# Patient Record
Sex: Female | Born: 1945
Health system: Southern US, Community
[De-identification: ages and names within clinical notes are randomized; demographics above are authoritative.]

## PROBLEM LIST (undated history)

## (undated) DIAGNOSIS — R112 Nausea with vomiting, unspecified: Secondary | ICD-10-CM

## (undated) DIAGNOSIS — E079 Disorder of thyroid, unspecified: Secondary | ICD-10-CM

## (undated) DIAGNOSIS — K760 Fatty (change of) liver, not elsewhere classified: Secondary | ICD-10-CM

## (undated) DIAGNOSIS — M8580 Other specified disorders of bone density and structure, unspecified site: Secondary | ICD-10-CM

## (undated) DIAGNOSIS — G5600 Carpal tunnel syndrome, unspecified upper limb: Secondary | ICD-10-CM

## (undated) DIAGNOSIS — E669 Obesity, unspecified: Secondary | ICD-10-CM

## (undated) DIAGNOSIS — M169 Osteoarthritis of hip, unspecified: Secondary | ICD-10-CM

## (undated) DIAGNOSIS — K219 Gastro-esophageal reflux disease without esophagitis: Secondary | ICD-10-CM

## (undated) DIAGNOSIS — H269 Unspecified cataract: Secondary | ICD-10-CM

## (undated) DIAGNOSIS — R748 Abnormal levels of other serum enzymes: Secondary | ICD-10-CM

## (undated) DIAGNOSIS — E785 Hyperlipidemia, unspecified: Secondary | ICD-10-CM

## (undated) DIAGNOSIS — K5790 Diverticulosis of intestine, part unspecified, without perforation or abscess without bleeding: Secondary | ICD-10-CM

## (undated) DIAGNOSIS — Z9889 Other specified postprocedural states: Secondary | ICD-10-CM

## (undated) DIAGNOSIS — M1990 Unspecified osteoarthritis, unspecified site: Secondary | ICD-10-CM

## (undated) DIAGNOSIS — K648 Other hemorrhoids: Secondary | ICD-10-CM

## (undated) DIAGNOSIS — R7303 Prediabetes: Secondary | ICD-10-CM

## (undated) DIAGNOSIS — J45909 Unspecified asthma, uncomplicated: Secondary | ICD-10-CM

## (undated) DIAGNOSIS — I10 Essential (primary) hypertension: Secondary | ICD-10-CM

## (undated) DIAGNOSIS — D649 Anemia, unspecified: Secondary | ICD-10-CM

## (undated) DIAGNOSIS — E559 Vitamin D deficiency, unspecified: Secondary | ICD-10-CM

## (undated) HISTORY — DX: Prediabetes: R73.03

## (undated) HISTORY — DX: Anemia, unspecified: D64.9

## (undated) HISTORY — DX: Gastro-esophageal reflux disease without esophagitis: K21.9

## (undated) HISTORY — PX: BREAST BIOPSY: SHX20

## (undated) HISTORY — DX: Unspecified asthma, uncomplicated: J45.909

## (undated) HISTORY — DX: Nausea with vomiting, unspecified: R11.2

## (undated) HISTORY — DX: Osteoarthritis of hip, unspecified: M16.9

## (undated) HISTORY — DX: Other specified postprocedural states: Z98.890

## (undated) HISTORY — DX: Hyperlipidemia, unspecified: E78.5

## (undated) HISTORY — PX: CARPAL TUNNEL RELEASE: SHX101

## (undated) HISTORY — PX: ROTATOR CUFF REPAIR: SHX139

## (undated) HISTORY — DX: Essential (primary) hypertension: I10

## (undated) HISTORY — DX: Other specified disorders of bone density and structure, unspecified site: M85.80

## (undated) HISTORY — PX: DENTAL SURGERY: SHX609

## (undated) HISTORY — DX: Other hemorrhoids: K64.8

## (undated) HISTORY — DX: Unspecified cataract: H26.9

## (undated) HISTORY — PX: TOE FUSION: SHX1070

## (undated) HISTORY — DX: Disorder of thyroid, unspecified: E07.9

## (undated) HISTORY — PX: CATARACT EXTRACTION: SUR2

## (undated) HISTORY — DX: Carpal tunnel syndrome, unspecified upper limb: G56.00

## (undated) HISTORY — DX: Abnormal levels of other serum enzymes: R74.8

## (undated) HISTORY — DX: Unspecified osteoarthritis, unspecified site: M19.90

## (undated) HISTORY — DX: Vitamin D deficiency, unspecified: E55.9

## (undated) HISTORY — DX: Obesity, unspecified: E66.9

## (undated) HISTORY — DX: Fatty (change of) liver, not elsewhere classified: K76.0

## (undated) HISTORY — DX: Diverticulosis of intestine, part unspecified, without perforation or abscess without bleeding: K57.90

## (undated) HISTORY — PX: COLONOSCOPY: SHX174

---

## 1998-09-06 ENCOUNTER — Other Ambulatory Visit: Admission: RE | Admit: 1998-09-06 | Discharge: 1998-09-06 | Payer: Self-pay | Admitting: Family Medicine

## 1999-04-21 ENCOUNTER — Encounter: Admission: RE | Admit: 1999-04-21 | Discharge: 1999-04-21 | Payer: Self-pay | Admitting: Family Medicine

## 1999-04-21 ENCOUNTER — Encounter: Payer: Self-pay | Admitting: Family Medicine

## 2000-01-04 ENCOUNTER — Other Ambulatory Visit: Admission: RE | Admit: 2000-01-04 | Discharge: 2000-01-04 | Payer: Self-pay | Admitting: Family Medicine

## 2000-05-10 ENCOUNTER — Encounter: Admission: RE | Admit: 2000-05-10 | Discharge: 2000-05-10 | Payer: Self-pay | Admitting: Family Medicine

## 2000-05-10 ENCOUNTER — Encounter: Payer: Self-pay | Admitting: Family Medicine

## 2000-06-06 ENCOUNTER — Ambulatory Visit (HOSPITAL_COMMUNITY): Admission: RE | Admit: 2000-06-06 | Discharge: 2000-06-06 | Payer: Self-pay | Admitting: Family Medicine

## 2000-06-06 ENCOUNTER — Encounter: Payer: Self-pay | Admitting: Family Medicine

## 2001-01-28 ENCOUNTER — Other Ambulatory Visit: Admission: RE | Admit: 2001-01-28 | Discharge: 2001-01-28 | Payer: Self-pay | Admitting: Family Medicine

## 2001-06-20 ENCOUNTER — Encounter: Admission: RE | Admit: 2001-06-20 | Discharge: 2001-06-20 | Payer: Self-pay | Admitting: Family Medicine

## 2001-06-20 ENCOUNTER — Encounter: Payer: Self-pay | Admitting: Family Medicine

## 2001-11-07 ENCOUNTER — Encounter: Payer: Self-pay | Admitting: Orthopaedic Surgery

## 2001-11-07 ENCOUNTER — Ambulatory Visit (HOSPITAL_COMMUNITY): Admission: RE | Admit: 2001-11-07 | Discharge: 2001-11-07 | Payer: Self-pay | Admitting: Orthopaedic Surgery

## 2002-02-19 ENCOUNTER — Other Ambulatory Visit: Admission: RE | Admit: 2002-02-19 | Discharge: 2002-02-19 | Payer: Self-pay | Admitting: Family Medicine

## 2002-02-27 ENCOUNTER — Ambulatory Visit (HOSPITAL_COMMUNITY): Admission: RE | Admit: 2002-02-27 | Discharge: 2002-02-27 | Payer: Self-pay | Admitting: Orthopaedic Surgery

## 2002-02-27 ENCOUNTER — Encounter: Payer: Self-pay | Admitting: Orthopaedic Surgery

## 2002-04-02 HISTORY — PX: JOINT REPLACEMENT: SHX530

## 2002-04-28 ENCOUNTER — Encounter: Payer: Self-pay | Admitting: Orthopaedic Surgery

## 2002-04-30 ENCOUNTER — Inpatient Hospital Stay (HOSPITAL_COMMUNITY): Admission: RE | Admit: 2002-04-30 | Discharge: 2002-05-05 | Payer: Self-pay | Admitting: Orthopaedic Surgery

## 2002-04-30 ENCOUNTER — Encounter: Payer: Self-pay | Admitting: Orthopaedic Surgery

## 2002-06-26 ENCOUNTER — Encounter: Admission: RE | Admit: 2002-06-26 | Discharge: 2002-06-26 | Payer: Self-pay | Admitting: Family Medicine

## 2002-06-26 ENCOUNTER — Encounter: Payer: Self-pay | Admitting: Family Medicine

## 2003-04-22 ENCOUNTER — Other Ambulatory Visit: Admission: RE | Admit: 2003-04-22 | Discharge: 2003-04-22 | Payer: Self-pay | Admitting: Family Medicine

## 2003-07-09 ENCOUNTER — Encounter: Admission: RE | Admit: 2003-07-09 | Discharge: 2003-07-09 | Payer: Self-pay | Admitting: Family Medicine

## 2004-06-15 ENCOUNTER — Other Ambulatory Visit: Admission: RE | Admit: 2004-06-15 | Discharge: 2004-06-15 | Payer: Self-pay | Admitting: Family Medicine

## 2004-07-21 ENCOUNTER — Encounter: Admission: RE | Admit: 2004-07-21 | Discharge: 2004-07-21 | Payer: Self-pay | Admitting: Family Medicine

## 2005-06-28 ENCOUNTER — Other Ambulatory Visit: Admission: RE | Admit: 2005-06-28 | Discharge: 2005-06-28 | Payer: Self-pay | Admitting: Family Medicine

## 2005-06-29 ENCOUNTER — Encounter: Admission: RE | Admit: 2005-06-29 | Discharge: 2005-06-29 | Payer: Self-pay | Admitting: Family Medicine

## 2005-07-27 ENCOUNTER — Encounter: Admission: RE | Admit: 2005-07-27 | Discharge: 2005-07-27 | Payer: Self-pay | Admitting: Family Medicine

## 2006-08-02 ENCOUNTER — Encounter: Admission: RE | Admit: 2006-08-02 | Discharge: 2006-08-02 | Payer: Self-pay | Admitting: Family Medicine

## 2006-08-15 ENCOUNTER — Other Ambulatory Visit: Admission: RE | Admit: 2006-08-15 | Discharge: 2006-08-15 | Payer: Self-pay | Admitting: Family Medicine

## 2006-09-04 ENCOUNTER — Encounter: Admission: RE | Admit: 2006-09-04 | Discharge: 2006-09-04 | Payer: Self-pay | Admitting: Family Medicine

## 2006-09-19 ENCOUNTER — Encounter: Admission: RE | Admit: 2006-09-19 | Discharge: 2006-09-19 | Payer: Self-pay | Admitting: Family Medicine

## 2007-07-06 ENCOUNTER — Encounter: Admission: RE | Admit: 2007-07-06 | Discharge: 2007-07-06 | Payer: Self-pay | Admitting: Orthopaedic Surgery

## 2007-08-15 ENCOUNTER — Encounter: Admission: RE | Admit: 2007-08-15 | Discharge: 2007-08-15 | Payer: Self-pay | Admitting: Family Medicine

## 2007-08-28 ENCOUNTER — Other Ambulatory Visit: Admission: RE | Admit: 2007-08-28 | Discharge: 2007-08-28 | Payer: Self-pay | Admitting: Family Medicine

## 2008-06-03 ENCOUNTER — Encounter (INDEPENDENT_AMBULATORY_CARE_PROVIDER_SITE_OTHER): Payer: Self-pay | Admitting: *Deleted

## 2008-08-20 ENCOUNTER — Encounter: Admission: RE | Admit: 2008-08-20 | Discharge: 2008-08-20 | Payer: Self-pay | Admitting: Internal Medicine

## 2009-07-20 ENCOUNTER — Telehealth: Payer: Self-pay | Admitting: Internal Medicine

## 2009-09-16 ENCOUNTER — Encounter: Admission: RE | Admit: 2009-09-16 | Discharge: 2009-09-16 | Payer: Self-pay | Admitting: Internal Medicine

## 2009-12-08 ENCOUNTER — Encounter: Payer: Self-pay | Admitting: Internal Medicine

## 2010-01-09 ENCOUNTER — Encounter (INDEPENDENT_AMBULATORY_CARE_PROVIDER_SITE_OTHER): Payer: Self-pay | Admitting: *Deleted

## 2010-01-11 ENCOUNTER — Ambulatory Visit: Payer: Self-pay | Admitting: Internal Medicine

## 2010-01-31 ENCOUNTER — Ambulatory Visit: Payer: Self-pay | Admitting: Internal Medicine

## 2010-05-04 NOTE — Procedures (Signed)
Summary: Colonoscopy  Patient: Allison [name] Note: All result statuses are Final unless otherwise noted.  Tests: (1) Colonoscopy (COL)   COL Colonoscopy           DONE     [facility]     [address]     [place], [place]  [address]           COLONOSCOPY PROCEDURE REPORT           PATIENT:  Allison [name], Allison [name]  MR#:  [id number]     BIRTHDATE:  February 13, 1946, 64 yrs. old  GENDER:  female     ENDOSCOPIST:  Wilhemina Bonito. Eda Keys, MD     REF. BY:  Screening Recall     PROCEDURE DATE:  01/31/2010     PROCEDURE:  Higher-risk screening colonoscopy G0105           ASA CLASS:  Class II     INDICATIONS:  Elevated Risk Screening, family history of colon     cancer ; Mother @55  (negative index exam 06-2003)     MEDICATIONS:   Fentanyl 75 mcg IV, Versed 9 mg IV           DESCRIPTION OF PROCEDURE:   After the risks benefits and     alternatives of the procedure were thoroughly explained, informed     consent was obtained.  Digital rectal exam was performed and     revealed no abnormalities.   The LB 180AL [id number] endoscope was     introduced through the anus and advanced to the cecum, which was     identified by both the appendix and ileocecal valve, without     limitations.Time to cecum = 4:17 min.  The quality of the prep was     excellent, using MoviPrep.  The instrument was then slowly     withdrawn (time = 9:09 min) as the colon was fully examined.     <<PROCEDUREIMAGES>>           FINDINGS:  Severe diverticulosis was found in the left colon with     sigmoid stenosis.  This was otherwise a normal examination of the     colon.  No polyps or cancers were seen.   Retroflexed views in the     rectum revealed internal hemorrhoids.    The scope was then     withdrawn from the patient and the procedure completed.           COMPLICATIONS:  None     ENDOSCOPIC IMPRESSION:     1) Severe diverticulosis in the left colon     2) Otherwise normal examination     3) No polyps or cancers     4) Internal  hemorrhoids           RECOMMENDATIONS:     1) Follow up colonoscopy in 5 years           ______________________________     Wilhemina Bonito. Eda Keys, MD           CC:  Charlesetta Shanks, MD; The Patient           n.     eSIGNED:   Wilhemina Bonito. Eda Keys at 01/31/2010 09:43 AM           Jamal Collin, [id number]  Note: An exclamation mark (!) indicates a result that was not dispersed into the flowsheet. Document Creation Date: 01/31/2010 9:43 AM _______________________________________________________________________  (1) Order result status: Final Collection or  observation date-time: 01/31/2010 09:36 Requested date-time:  Receipt date-time:  Reported date-time:  Referring Physician:   Ordering Physician: Fransico Setters [phone]) Specimen Source:  Source: Launa Grill Order Number: [id number] Lab site:   Appended Document: Colonoscopy    Clinical Lists Changes  Observations: Added new observation of COLONNXTDUE: 02/2015 (01/31/2010 13:18)

## 2010-05-04 NOTE — Miscellaneous (Signed)
Summary: [facility]  Clinical Lists Changes  Medications: Added new medication of MOVIPREP 100 GM  SOLR (PEG-KCL-NACL-NASULF-NA ASC-C) As per prep instructions. - Signed Rx of MOVIPREP 100 GM  SOLR (PEG-KCL-NACL-NASULF-NA ASC-C) As per prep instructions.;  #1 x 0;  Signed;  Entered by: Durwin Glaze RN;  Authorized by: Hilarie Fredrickson MD;  Method used: Electronically to [facility] [address] # [id number]*, [address] [place], [place], [place]  [address], Ph: [phone] or [phone], Fax: [phone] Observations: Added new observation of NKA: T (01/11/2010 16:23)    Prescriptions: MOVIPREP 100 GM  SOLR (PEG-KCL-NACL-NASULF-NA ASC-C) As per prep instructions.  #1 x 0   Entered by:   Durwin Glaze RN   Authorized by:   Hilarie Fredrickson MD   Signed by:   Durwin Glaze RN on 01/11/2010   Method used:   Electronically to        [id number]* (retail)       [address]       [place], [place]  [address]       Ph: [phone] or [phone]       Fax: [phone]   RxID:   [id number]

## 2010-05-04 NOTE — Letter (Signed)
Summary: [facility] Instructions  [facility]  [address] [place], [place] [address]   Phone: [phone]  Fax: [phone]       Allison [name]    May 09, 1945    MRN: [id number]        Procedure Day /Date:  Tuesday 01/31/2010     Arrival Time: 8:00 am      Procedure Time:  9:00 am     Location of Procedure:                    _x _  [facility] (4th Floor)                        PREPARATION FOR COLONOSCOPY WITH MOVIPREP   Starting 5 days prior to your procedure Thursday 10/27 do not eat nuts, seeds, popcorn, corn, beans, peas,  salads, or any raw vegetables.  Do not take any fiber supplements (e.g. Metamucil, Citrucel, and Benefiber).  THE DAY BEFORE YOUR PROCEDURE         DATE: Monday 10/31  1.  Drink clear liquids the entire day-NO SOLID FOOD  2.  Do not drink anything colored red or purple.  Avoid juices with pulp.  No orange juice.  3.  Drink at least 64 oz. (8 glasses) of fluid/clear liquids during the day to prevent dehydration and help the prep work efficiently.  CLEAR LIQUIDS INCLUDE: Water Jello Ice Popsicles Tea (sugar ok, no milk/cream) Powdered fruit flavored drinks Coffee (sugar ok, no milk/cream) Gatorade Juice: apple, white grape, white cranberry  Lemonade Clear bullion, consomm, broth Carbonated beverages (any kind) Strained chicken noodle soup Hard Candy                             4.  In the morning, mix first dose of MoviPrep solution:    Empty 1 Pouch A and 1 Pouch B into the disposable container    Add lukewarm drinking water to the top line of the container. Mix to dissolve    Refrigerate (mixed solution should be used within 24 hrs)  5.  Begin drinking the prep at 5:00 p.m. The MoviPrep container is divided by 4 marks.   Every 15 minutes drink the solution down to the next mark (approximately 8 oz) until the full liter is complete.   6.  Follow completed prep with 16 oz of clear liquid of your choice (Nothing  red or purple).  Continue to drink clear liquids until bedtime.  7.  Before going to bed, mix second dose of MoviPrep solution:    Empty 1 Pouch A and 1 Pouch B into the disposable container    Add lukewarm drinking water to the top line of the container. Mix to dissolve    Refrigerate  THE DAY OF YOUR PROCEDURE      DATE: Tuesday 11/1  Beginning at 4:00 a.m. (5 hours before procedure):         1. Every 15 minutes, drink the solution down to the next mark (approx 8 oz) until the full liter is complete.  2. Follow completed prep with 16 oz. of clear liquid of your choice.    3. You may drink clear liquids until 7:00 am (2 HOURS BEFORE PROCEDURE).   MEDICATION INSTRUCTIONS  Unless otherwise instructed, you should take regular prescription medications with a small sip of water   as early as possible the morning  of your procedure.         OTHER INSTRUCTIONS  You will need a responsible adult at least 63 years of age to accompany you and drive you home.   This person must remain in the waiting room during your procedure.  Wear loose fitting clothing that is easily removed.  Leave jewelry and other valuables at home.  However, you may wish to bring a book to read or  an iPod/MP3 player to listen to music as you wait for your procedure to start.  Remove all body piercing jewelry and leave at home.  Total time from sign-in until discharge is approximately 2-3 hours.  You should go home directly after your procedure and rest.  You can resume normal activities the  day after your procedure.  The day of your procedure you should not:   Drive   Make legal decisions   Operate machinery   Drink alcohol   Return to work  You will receive specific instructions about eating, activities and medications before you leave.    The above instructions have been reviewed and explained to me by   Durwin Glaze RN  January 11, 2010 4:41 PM     I fully understand and can  verbalize these instructions _____________________________ Date _________

## 2010-05-04 NOTE — Progress Notes (Signed)
Summary: Schedule Colonoscopy  Phone Note Outgoing Call Call back at [facility] Phone [phone] Call back at Work Phone [phone]   Call placed by: Harlow Mares CMA Duncan Dull),  July 20, 2009 2:27 PM Call placed to: Patient Summary of Call: left message with nancy shaw to have patient call back and schedule her colon. patient has hx colon cancer in mother.  Initial call taken by: Harlow Mares CMA Duncan Dull),  July 20, 2009 2:27 PM  Follow-up for Phone Call        pt declined to schedule at this time.  Follow-up by: Harlow Mares CMA (AAMA),  Aug 01, 2009 10:26 AM

## 2010-05-04 NOTE — Letter (Signed)
Summary: Pre Visit Letter Revised  [facility]  [address] [place], [place] [address]   Phone: [phone]  Fax: [phone]        12/08/2009 MRN: [id number] Allison [name] [address] [name], [place]  [address]  [place]             Procedure Date:  Nov 1 at [facility] to the Gastroenterology Division at [facility].    You are scheduled to see a nurse for your pre-procedure visit on Oct 21,2011 at 1pm on the 3rd floor at [facility], [address]  We ask that you try to arrive at our office 15 minutes prior to your appointment time to allow for check-in.  Please take a minute to review the attached form.  If you answer "Yes" to one or more of the questions on the first page, we ask that you call the person listed at your earliest opportunity.  If you answer "No" to all of the questions, please complete the rest of the form and bring it to your appointment.    Your nurse visit will consist of discussing your medical and surgical history, your immediate family medical history, and your medications.   If you are unable to list all of your medications on the form, please bring the medication bottles to your appointment and we will list them.  We will need to be aware of both prescribed and over the counter drugs.  We will need to know exact dosage information as well.    Please be prepared to read and sign documents such as consent forms, a financial agreement, and acknowledgement forms.  If necessary, and with your consent, a friend or relative is welcome to sit-in on the nurse visit with you.  Please bring your insurance card so that we may make a copy of it.  If your insurance requires a referral to see a specialist, please bring your referral form from your primary care physician.  No co-pay is required for this nurse visit.     If you cannot keep your appointment, please call [phone] to cancel or reschedule prior to your appointment date.  This  allows [place] the opportunity to schedule an appointment for another patient in need of care.    Thank you for choosing [facility] for your medical needs.  We appreciate the opportunity to care for you.  Please visit [place] at our website  to learn more about our practice.  Sincerely, The Gastroenterology Division

## 2010-08-18 NOTE — H&P (Signed)
NAME:  Allison [name], Allison [name]                      ACCOUNT NO.:  [id number]   MEDICAL RECORD NO.:  [id number]                   PATIENT TYPE:  INP   LOCATION:  NA                                   FACILITY:  [facility]   PHYSICIAN:  Claude Manges. Cleophas Dunker, M.D.            DATE OF BIRTH:  1946-04-02   DATE OF ADMISSION:  04/30/2002  DATE OF DISCHARGE:                                HISTORY & PHYSICAL   CHIEF COMPLAINT:  One-year history of gradual onset right hip pain.   HISTORY OF PRESENT ILLNESS:  This 65 year old white female patient presented  to Dr. Cleophas Dunker with a one-year history of gradual onset but progressively  worsening right hip pain.  She has had no known injury or prior surgery to  her hip and the pain has been getting progressively worse since February  2003.  At this point, the pain is described as a constant grinding or  throbbing sensation located in the right groin, trochanter, and buttock area  with radiation all the way down her leg to her ankles.  The pain increases  with any prolonged sitting, standing, or lying down and nothing really seems  to alleviate it at this point.  The pain does keep her up at night.  She  says she can feel the hip pop and grind a lot of times.  There is no back  pain, change in bowel or bladder function or any paresthesias associated  with the pain.  She has currently been using Darvocet for pain and that is  not really relieving it very much and so she switched to some Vicodin and  that seems to help a little bit more.  She has been ambulating with a cane  since the last 2-3 months.  She receives intra-articular cortisone  injections in the past and with the last one being about Thanksgiving last  year and that really provided no relief.   ALLERGIES:  No known drug allergies.   CURRENT MEDICATIONS:  1. Micro-K 10 mEq 2 tablets p.o. q.a.m.  2. Lasix 80 mg 1 tablet p.o. q.a.m.  3. Prevacid 30 mg 1 tablet p.o. q.a.m.  4. Celebrex 200 mg 1  tablet p.o. b.i.d.  5. Lipitor 10 mg 1 tablet p.o. q.p.m.  6. Synthroid 0.125 mg 1 tablet p.o. q.a.m.  7. Darvocet-N 100 one to two p.o. q.h.s. p.r.n. pain.  8. Vicodin 5/500 mg 1-2 p.o. q.6h. p.r.n. for pain.   PAST MEDICAL HISTORY:  She has had gastroesophageal reflux disease for the  last 15 years but Prevacid keeps it well under control.  She denies any  history of diabetes mellitus, hypertension, thyroid disease, hiatal hernia,  peptic ulcer disease, heart disease, asthma, or any other chronic medical  condition other than noted previously.   PAST SURGICAL HISTORY:  1. Right rotator cuff repair, open, by Dr. Ophelia Charter in 1996.  2. Open left rotator cuff repair in 1999 by Dr. Ophelia Charter.  3. Tendon repair, left ankle, by Dr. Annell Greening.  4. Fusion of the left second toe by Dr. Annell Greening.   She denies any complications from the above-mentioned procedures.   SOCIAL HISTORY:  She does have a probably 20-26 pack year history of  cigarette smoking, which she quit in 1985.  She smoked 2-1/2 to 3 packs a  day for about two years and then one pack of cigarettes a day for about 14  years.  She does drink alcohol about 1-2 glasses of wine at dinner every  night.  She does not use any drugs.  She is single and lives with a roommate  in a one-story house with five steps into the main entrance.  She does not  have any children.  Her medical doctor is Dr. Bradd Canary.  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   FAMILY HISTORY:  Her mother died at the age of 33 with colon cancer.  Her  father died at the age of 11 with heart disease and hypertension.  She had  one brother who died at age 78 with AIDS and one sister who is alive at age  24 with hypertension.   REVIEW OF SYSTEMS:  She does wear glasses.  She has a living will and her  power-of-attorney is Benjamin Stain.  All other systems are negative and  noncontributory.   PHYSICAL EXAMINATION:  GENERAL:  A well-developed,  well-nourished,  overweight white female in no acute distress.  Talks easily with the  examiner.  She does walk with a significant antalgic gait and a limp on the  right with the use of a cane.  She moves very slowly from a sitting to a  standing position and does have difficulty with raising that right leg up on  to the exam table.  Height is 5 feet 4 inches, weight 227 pounds.  BMI is  38.  VITAL SIGNS:  Temperature 97.4 degrees Fahrenheit, pulse 80, respirations  16, blood pressure 160/86.  HEENT:  Normocephalic, atraumatic without frontal or maxillary sinus  tenderness to palpation.  Conjunctivae are pink.  Sclerae are anicteric.  PERRLA.  EOM's intact.  No visible external ear deformities.  Hearing  grossly intact.  Membranes pearly-gray bilaterally with good light reflex.  Nose and nasal septum midline.  Nasal mucosa pink and moist without exudates  or polyps noted.  Buccal mucosa pink and moist.  Good dentition.  Pharynx  without erythema or exudates.  Tongue and uvula midline.  Tongue without  vesiculations and uvula rises equally with phonation.  NECK:  No visible masses or lesions noted.  Trachea midline.  No palpable  lymphadenopathy nor thyromegaly.  Carotids +2 bilaterally without bruits.  Full range of motion and nontender to palpation along the cervical spine.  CARDIOVASCULAR:  Heart rate and rhythm regular.  S1 and S2 present without  rubs, clicks, or murmurs noted.  RESPIRATORY:  Respirations even and unlabored.  Breath sounds clear to  auscultation bilaterally without rales or wheezes noted.  ABDOMEN:  Rounded abdominal contour.  Bowel sounds present x4 quadrants.  Soft and nontender to palpation without hepatosplenomegaly nor CVA  tenderness.  Nontender to palpation along the entire length of the vertebral  column.  BREASTS/GENITOURINARY/RECTAL/PELVIC:  These exams deferred at this time. MUSCULOSKELETAL:  She has no obvious deformities bilateral upper extremities   with full range of motion to these extremities without pain.  She does have  well-healed anterior shoulder incision  lines bilaterally.  Radial pulses are  +2 bilaterally.  The femoral pulses are +1 bilaterally.  Full range of  motion of her knees, ankles, and toes bilaterally.  DP and PT pulses are +2.  No lower extremity edema and negative Homans bilaterally.  She has no pain  with palpation of the knee joints bilaterally.  Left hip has full extension  and flexion to 110 degrees.  There is no pain with palpation in the groin or  over the greater trochanter.  No crepitus with range of motion of the left  hip and she is pretty much full internal/external of the left hip without  pain.  Right hip has full extension but flexion only to about 50-55 degrees  at this time.  There is some palpable clunking and crepitus with range of  motion of the right hip.  She has a fair amount of pain with any  internal/external rotation of the right hip and this is actually minimal and  markedly decreased from the left.  No real pain with palpation in the right  groin or over the right greater trochanter.  NEUROLOGIC:  Alert and oriented x3.  Cranial nerves II-XII were grossly  intact.  Strength 5/5 bilateral upper and lower extremities.  Rapid  alternating movements intact.  Deep tendon reflexes 2+ bilateral upper and  lower extremities.  Sensation intact to light touch.   RADIOLOGIC FINDINGS:  X-rays from July 2003 showed significant decrease in  the joint space of the right hip compared to the left.   IMPRESSION:  1. End-stage osteoarthritis, right hip.  2. Gastroesophageal reflux disease.  3. Obesity.   PLAN:  The patient will be admitted to [facility] on April 30, 2002, where she will undergo a right total hip arthroplasty by Dr. Claude Manges.  [place].  She will undergo all the routine preoperative laboratory tests  and studies prior to this procedure.  She has donated 2 units of  autologous  for this procedure.  If she has any medical issues while she is  hospitalized, we will consult Dr. Artis Flock.     Legrand Pitts Duffy, P.A.                      Claude Manges. Cleophas Dunker, M.D.    KED/MEDQ  D:  04/20/2002  T:  04/20/2002  Job:  [id number]

## 2010-08-18 NOTE — Op Note (Signed)
NAME:  Allison [name], Allison [name]                      ACCOUNT NO.:  [id number]   MEDICAL RECORD NO.:  [id number]                   PATIENT TYPE:  INP   LOCATION:  2899                                 FACILITY:  [facility]   PHYSICIAN:  Claude Manges. Cleophas Dunker, M.D.            DATE OF BIRTH:  October 24, 1945   DATE OF PROCEDURE:  04/30/2002  DATE OF DISCHARGE:                                 OPERATIVE REPORT   PREOPERATIVE DIAGNOSIS:  Osteoarthritis of the right hip.   POSTOPERATIVE DIAGNOSIS:  Osteoarthritis of the right hip.   OPERATION PERFORMED:  Right total hip replacement.   SURGEON:  Claude Manges. Cleophas Dunker, M.D.   ASSISTANT:  1. Lenard Galloway. Chaney Malling, M.D.  2. Legrand Pitts. Duffy, P.A.   ANESTHESIA:  General orotracheal anesthesia.   COMPLICATIONS:  None.   COMPONENTS:  Depuy AML small stature 12 mm femoral component, a 32 mm head  with a +5 neck, 52 mm outer diameter Pinnacle acetabular cup with a Pinnacle  Marathon polyethylene lining with a 10 degree angle and Apex hole  eliminator.   DESCRIPTION OF PROCEDURE:  With the patient comfortable on the operating  table and under general orotracheal anesthesia the nursing staff inserted a  Foley catheter.  The patient was then placed in a lateral decubitus position  with the right side up and secured to the operating room table with an  Innomed hip system.  The right hip was then prepped with DuraPrep from the  iliac crest to the mid calf.  Sterile draping was performed.   A routine Southern incision was utilized and by sharp dissection carried  down to subcutaneous tissue.  There was abundant adipose tissue which made  the procedure somewhat technically difficult.  The adipose tissue was  incised with a Bovie and self-retaining retractors were inserted.  The  iliotibial band was identified and incised along the length of the skin  incision.  Short external rotators were identified.  Tendinous structures  were tagged with 0 Tycron suture and then  they were incised from their  attachment to the greater trochanter posteriorly.  The capsule was  identified and incised along the femoral neck and head.  There was a small  amount of serosanguinous fluid.  The hip was then dislocated posteriorly.  The head was misshapen.  There were multiple areas of articular cartilage  loss.  In several areas several pieces of articular cartilage and some  attached bone in the acetabulum.  Using the AML guide, the femoral neck was  osteotomized and the femoral head was then removed from the wound.  The  piriformis fossa was identified.  A starter hole was then made with a  reamer, a canal finder inserted and then reaming was performed to 11.5 to  accept a 12 mm prosthesis.  The broaches were then inserted.  Initially, the  10.5 and then the 12 broach with a medium modified aspect __________ small  stature.  Then the calcar reamer was applied to obtain the appropriate  calcar angle.   Retractors were then placed about the acetabulum.  The labrum was sharply  excised.  We had excellent visualization of the glenoid.  Reaming was  performed to 51 mm to accept a 52 mm prosthesis.  We had nice bleeding  circumferentially and we had nice depth.  We trialed a 50 and a 52.  The 50  had good rim fit but would completely seat in the acetabulum.  The 52 had  good rim fit and would not completely seat.  The final Marathon metallic  acetabular component was then impacted in what we felt was a neutral  position.  We inserted a trial acetabular component, reinserted the broach  and trialed several neck sizes.  We felt that +5 re-established leg lengths  and was perfectly stable in flexion, extension, internal-external rotation.  The trial components were then removed.  The apex hole eliminator inserted  followed by the Pinnacle Marathon liner, 10 degree buildup posteriorly.   A 12 mm medium modified aspect femoral component was then applied to freshen  the calcar and  the +5 hip ball was then impacted after cleaning off the  Morris tapered neck.  The acetabulum was inspected.  It was cleared of any  loose material.  The entire construct was then reduced and then placed to  full range of motion.  There was no toggling but we felt that leg lengths  were symmetrical and there was no instability.  The wound was again  irrigated with antibiotic and saline solution as it was throughout the  procedure.  The capsule was closed anatomically with #1 Ethibond.  The short  external rotators were reapproximated anatomically.  The iliotibial band was  closed with a running 0 Vicryl.  The deep adipose tissue and subcu were  closed in multiple layers with 0 and 2-0 Vicryl, the skin closed with skin  clips.  Sterile bulky dressing was applied as was a knee immobilizer.  The  patient was then awakened and returned to __________  recovery room without  complications.                                                Claude Manges. Cleophas Dunker, M.D.    PWW/MEDQ  D:  04/30/2002  T:  04/30/2002  Job:  [id number]

## 2010-08-18 NOTE — Discharge Summary (Signed)
NAME:  Allison [name], Allison [name]                      ACCOUNT NO.:  [id number]   MEDICAL RECORD NO.:  [id number]                   PATIENT TYPE:  INP   LOCATION:  5013                                 FACILITY:  [facility]   PHYSICIAN:  Claude Manges. Cleophas Dunker, M.D.            DATE OF BIRTH:  1946/02/13   DATE OF ADMISSION:  04/30/2002  DATE OF DISCHARGE:  05/05/2002                                 DISCHARGE SUMMARY   ADMISSION DIAGNOSES:  1. End-stage osteoarthritis, right hip.  2. Gastroesophageal reflux disease.  3. Obesity.  4. Hypercholesterolemia.   DISCHARGE DIAGNOSES:  1. End-stage osteoarthritis, right hip.  2. Acute blood loss anemia secondary to surgery.  3. Constipation.  4. Hematoma, right hip.  5. Gastroesophageal reflux disease.  6. Obesity.  7. Hypercholesterolemia.   SURGICAL PROCEDURE:  On April 30, 2002, the patient underwent a right  total hip arthroplasty by Dr. Claude Manges. Whitfield, assisted by Dr. Lenard Galloway.  [facility] and [facility]. Duffy, P.A.C.  She had a Pinnacle acetabular cup,  size 52 mm, 100 series, with an apex hole eliminator and then a Pinnacle  Marathon acetabular liner, lipped, internal diameter of 32 mm, external  diameter 52 mm.  She had a small stature AML 155-mm length, 40-mm off-set  size 12 and an Articul-eze femoral head, 32 mm +5 on a 12/14 cone.   COMPLICATIONS:  None.   CONSULTS:  1. Pharmacy consult for Coumadin therapy, April 30, 2002.  2. Physical therapy, Case Management and Rehab Medicine consult, May 01, 2002.  3. Occupational therapy consult, May 04, 2002.   HISTORY OF PRESENT ILLNESS:  This 65 year old white female patient presented  to Dr. Cleophas Dunker with one-year history of gradual-onset, progressive right  hip pain.  The pain is a constant grinding or throbbing in the right groin,  trochanter and buttock area with radiation down her leg to her ankle.  It  increases with prolonged sitting, standing or lying and nothing  alleviates  it.  The hip pops and grinds.  Darvocet did not provide much relief.  She  has failed conservative treatment and because of that, she is presenting for  a right total hip arthroplasty.   HOSPITAL COURSE:  The patient tolerated her surgical procedure well without  immediate postoperative complications.  She was transferred to 5000.  Postop  day 1, pain was well-controlled.  Hemoglobin 9.3, hematocrit 27.5.  She had  some problems with nausea and that was monitored.  She was started on  therapy per protocol.  She did make good progress over the next several  days.  She was noted to have a fair amount of swelling in that right hip on  the 1st and her hemoglobin was 8.1 with hematocrit of 24.4.  It was felt she  had a hematoma there and she was transfused with two units of autologous  blood.  She did have some difficulty with constipation  and that was treated  with a laxative.  She did well after her transfusion and the hematoma was  treated with a K pad and she was started on Keflex four times daily.  On the  3rd, it was felt she was stable enough for discharge home and she was  discharged home at that time.   DISCHARGE INSTRUCTIONS:   DIET:  She can resume her regular pre-hospitalization diet.   MEDICATIONS:  She can resume her pre-hospitalization medications except for  her Celebrex, Darvocet and Vicodin.  These medications included:  1. Micro-K 10 mEq two tablets p.o. q.a.m.  2. Lasix 80 mg p.o. q.a.m.  3. Prevacid 30 mg p.o. q.a.m.  4. Lipitor 10 mg p.o. q.p.m.  5. Synthroid 0.125 mg p.o. q.a.m.   Additional medications include:  1. Coumadin -- take as directed by the pharmacy.  2. Percocet 5/325 mg one to two p.o. q.4-6h. p.r.n. for pain.  3. Keflex 500 mg one tablet p.o. q.i.d.  4. OxyContin 10 mg two tablets p.o. b.i.d. for seven days and then one     tablet p.o. b.i.d. for five days.   ACTIVITY:  She is to be 50% weightbearing on the right leg with the use of  a  walker.  She is arranged for home health physical therapy and an R.N. and  pharmacy per [facility].  First PT and INR are to be drawn on  Thursday.   WOUND CARE:  She needs to keep her incision clean and dry and may shower  after no drainage from the wound for two days.  She is to notify Dr.  Cleophas Dunker of temperature greater than or equal to 101.5 degrees Fahrenheit,  chills, pain not relieved by pain medications, or foul-smelling drainage  from the wound.  She is to follow hip precautions per physical therapy.   FOLLOWUP:  She is to follow up with Dr. Cleophas Dunker in our office in about 7  to 10 days and needs to call [phone] for that appointment.   LABORATORY DATA:  X-ray taken of her pelvis and right hip on April 30, 2002 showed the right hip prosthesis in appropriate position with no  evidence of fracture or dislocation.   ABG on April 30, 2002 showed a pH of 7.338, PCO2 of 46, PO2 of 41, bicarb  of 25 and an O2 saturation of 72%.   On April 28, 2002, white count 10.6, hemoglobin 11.4, hematocrit 33.3.  On  the 29th, hemoglobin 11, hematocrit 33.  On May 01, 2002, hemoglobin  9.3, hematocrit 27.5.  On the 31st, hemoglobin 8.3, hematocrit 24.3.  On  May 03, 2002, hemoglobin 8.1, hematocrit 24.1, and on May 04, 2002,  white count 8.4, hemoglobin 10.1, hematocrit 30 and platelets 289,000.   On May 05, 2002, PT 16.3, INR 1.3.   On May 01, 2002, glucose 127.  On the 31st, sodium 136, potassium 3.3,  glucose 128 and calcium 7.9.  All other laboratory studies were within  normal limits.     Legrand Pitts Duffy, P.A.                      Claude Manges. Cleophas Dunker, M.D.    KED/MEDQ  D:  06/18/2002  T:  06/20/2002  Job:  [id number]   cc:   Quita Skye. Artis Flock, M.D.  [address]  [place]  [place] [address]  Fax: [phone]

## 2010-09-01 ENCOUNTER — Other Ambulatory Visit: Payer: Self-pay | Admitting: Internal Medicine

## 2010-09-01 DIAGNOSIS — Z1231 Encounter for screening mammogram for malignant neoplasm of breast: Secondary | ICD-10-CM

## 2010-09-22 ENCOUNTER — Ambulatory Visit: Payer: Self-pay

## 2010-09-26 ENCOUNTER — Ambulatory Visit (INDEPENDENT_AMBULATORY_CARE_PROVIDER_SITE_OTHER): Payer: 59 | Admitting: Internal Medicine

## 2010-09-26 DIAGNOSIS — R030 Elevated blood-pressure reading, without diagnosis of hypertension: Secondary | ICD-10-CM

## 2010-09-26 DIAGNOSIS — L259 Unspecified contact dermatitis, unspecified cause: Secondary | ICD-10-CM

## 2010-09-26 DIAGNOSIS — L539 Erythematous condition, unspecified: Secondary | ICD-10-CM

## 2010-09-26 DIAGNOSIS — E785 Hyperlipidemia, unspecified: Secondary | ICD-10-CM

## 2010-09-29 ENCOUNTER — Ambulatory Visit
Admission: RE | Admit: 2010-09-29 | Discharge: 2010-09-29 | Disposition: A | Discharge status: Home / Self Care | Payer: 59 | Source: Ambulatory Visit | Attending: Internal Medicine | Admitting: Internal Medicine

## 2010-09-29 DIAGNOSIS — Z1231 Encounter for screening mammogram for malignant neoplasm of breast: Secondary | ICD-10-CM

## 2010-10-05 ENCOUNTER — Ambulatory Visit (INDEPENDENT_AMBULATORY_CARE_PROVIDER_SITE_OTHER): Payer: 59 | Admitting: Internal Medicine

## 2010-10-05 DIAGNOSIS — L519 Erythema multiforme, unspecified: Secondary | ICD-10-CM

## 2010-10-05 DIAGNOSIS — R7989 Other specified abnormal findings of blood chemistry: Secondary | ICD-10-CM

## 2010-10-25 ENCOUNTER — Encounter: Payer: Self-pay | Admitting: Emergency Medicine

## 2010-10-26 ENCOUNTER — Other Ambulatory Visit (HOSPITAL_COMMUNITY): Admission: RE | Admit: 2010-10-26 | Payer: 59 | Source: Ambulatory Visit | Admitting: Internal Medicine

## 2010-10-26 ENCOUNTER — Ambulatory Visit (INDEPENDENT_AMBULATORY_CARE_PROVIDER_SITE_OTHER): Payer: 59 | Admitting: Internal Medicine

## 2010-10-26 ENCOUNTER — Ambulatory Visit: Payer: 59 | Admitting: Internal Medicine

## 2010-10-26 ENCOUNTER — Encounter: Payer: Self-pay | Admitting: Internal Medicine

## 2010-10-26 DIAGNOSIS — Z113 Encounter for screening for infections with a predominantly sexual mode of transmission: Secondary | ICD-10-CM

## 2010-10-26 DIAGNOSIS — Z01419 Encounter for gynecological examination (general) (routine) without abnormal findings: Secondary | ICD-10-CM

## 2010-10-26 DIAGNOSIS — Z1272 Encounter for screening for malignant neoplasm of vagina: Secondary | ICD-10-CM

## 2010-10-26 DIAGNOSIS — E785 Hyperlipidemia, unspecified: Secondary | ICD-10-CM

## 2010-10-26 MED ORDER — ATORVASTATIN CALCIUM 20 MG PO TABS: 20.0000 mg | ORAL_TABLET | Freq: Every day | ORAL | Status: DC

## 2010-10-26 NOTE — Patient Instructions (Signed)
Take lipitor as prescribed Follow up as needewd

## 2010-10-28 ENCOUNTER — Encounter: Payer: Self-pay | Admitting: Internal Medicine

## 2010-10-28 DIAGNOSIS — E785 Hyperlipidemia, unspecified: Secondary | ICD-10-CM | POA: Insufficient documentation

## 2010-10-28 DIAGNOSIS — K760 Fatty (change of) liver, not elsewhere classified: Secondary | ICD-10-CM | POA: Insufficient documentation

## 2010-10-28 DIAGNOSIS — E079 Disorder of thyroid, unspecified: Secondary | ICD-10-CM | POA: Insufficient documentation

## 2010-10-28 DIAGNOSIS — R748 Abnormal levels of other serum enzymes: Secondary | ICD-10-CM | POA: Insufficient documentation

## 2010-10-28 DIAGNOSIS — M169 Osteoarthritis of hip, unspecified: Secondary | ICD-10-CM | POA: Insufficient documentation

## 2010-10-28 DIAGNOSIS — K5790 Diverticulosis of intestine, part unspecified, without perforation or abscess without bleeding: Secondary | ICD-10-CM | POA: Insufficient documentation

## 2010-10-28 NOTE — Progress Notes (Signed)
Subjective:    Patient ID: Allison [name], female    DOB: 19-Mar-1946, 65 y.o.   MRN: [id number]  HPI  [name] is overall doing well.  She went to see Dr. Altamese Cabal for an erythema multiforme type rash and she reports he took a biopsy but she has not received report as yet.  'She is still on a steroid creme and lesions are resolving.    She brings results of her cholesterol done at her workplace.  Total cholesterol 251 and HDL 69.  She wishes to restart her Lipitor .  She was on a 10 mg dose  Mammogram in 7/12 was neg. Colonosscopy 11/11 showed diverticulosis.  She has no chest pain, SOB, edema  No Known Allergies Past Medical History  Diagnosis Date  . Diverticulosis   . Hyperlipidemia   . Thyroid disease     hypo  . Elevated serum alkaline phosphatase level   . Fatty liver disease, nonalcoholic   . Obesity   . DJD (degenerative joint disease) of hip   . DJD (degenerative joint disease) of hip     s/P R hip replacement   Past Surgical History  Procedure Date  . Rotator cuff repair 1996, 1998, 2008  . Joint replacement 2004    R hip  . Toe fusion 2000's   History   Social History  . Marital Status: Married    Spouse Name: N/A    Number of Children: N/A  . Years of Education: N/A   Occupational History  . Not on file.   Social History Main Topics  . Smoking status: Former Smoker -- 2.0 packs/day for 20 years    Types: Cigarettes    Quit date: 04/03/1983  . Smokeless tobacco: Never Used  . Alcohol Use: 3.5 oz/week    7 drink(s) per week  . Drug Use: No  . Sexually Active: Not Currently   Other Topics Concern  . Not on file   Social History Narrative  . No narrative on file   Family History  Problem Relation Age of Onset  . Cancer Mother 14    colon  . Heart disease Father   . HIV Brother   . Hearing loss Daughter    Patient Active Problem List  Diagnoses  . Hyperlipidemia  . Thyroid disease  . Diverticulosis  . Elevated serum alkaline phosphatase level   . Fatty liver disease, nonalcoholic  . DJD (degenerative joint disease) of hip   Current Outpatient Prescriptions on File Prior to Visit  Medication Sig Dispense Refill  . Calcium Carbonate-Vit D-Min (CALCIUM 1200 PO) Take 1 tablet by mouth daily.        . Cholecalciferol (VITAMIN D3) 5000 UNITS TABS Take 1 tablet by mouth daily.        . furosemide (LASIX) 80 MG tablet Take 80 mg by mouth daily.       Marland Kitchen levothyroxine (SYNTHROID, LEVOTHROID) 125 MCG tablet Take 125 mcg by mouth daily.        . potassium chloride (MICRO-K) 10 MEQ CR capsule Take 10 mEq by mouth 2 (two) times daily.        . RABEprazole (ACIPHEX) 20 MG tablet Take 20 mg by mouth daily.             Review of Systems See HPI    Objective:   Physical Exam Physical Exam  Constitutional: She is oriented to person, place, and time. She appears well-developed and well-nourished. She is cooperative.  HENT:  Head:  Normocephalic and atraumatic.  Right Ear: Tympanic membrane normal.  Left Ear: Tympanic membrane normal.  Nose: Nose normal.  Mouth/Throat: Oropharynx is clear and moist and mucous membranes are normal. No oropharyngeal exudate or posterior oropharyngeal erythema.  Eyes: Conjunctivae and EOM are normal. Pupils are equal, round, and reactive to light.  Neck: Neck supple. No JVD present. Carotid bruit is not present. No mass and no thyromegaly present.  Cardiovascular: Regular rhythm, normal heart sounds, intact distal pulses and normal pulses.  Exam reveals no gallop and no friction rub.   No murmur heard. Pulses:      Dorsalis pedis pulses are 2+ on the right side, and 2+ on the left side.  Pulmonary/Chest: Breath sounds normal. She has no wheezes. She has no rhonchi. She has no rales. Right breast exhibits no mass, no nipple discharge and no skin change. Left breast exhibits no mass, no nipple discharge and no skin change.  Abdominal: Soft. Bowel sounds are normal. She exhibits no distension and no mass. There  is no hepatosplenomegaly. There is no tenderness. There is no CVA tenderness.  Genitourinary: Rectum normal, vagina normal and uterus normal. Rectal exam shows no mass. Guaiac negative stool. No labial fusion. There is no lesion on the right labia. There is no lesion on the left labia. Cervix exhibits no motion tenderness. Right adnexum displays no mass, no tenderness and no fullness. Left adnexum displays no mass, no tenderness and no fullness. No erythema around the vagina.  Musculoskeletal:       No active synovitis to any joint.    Lymphadenopathy:       Right cervical: No superficial cervical adenopathy present.      Left cervical: No superficial cervical adenopathy present.       Right axillary: No pectoral and no lateral adenopathy present.       Left axillary: No pectoral and no lateral adenopathy present.      Right: No inguinal adenopathy present.       Left: No inguinal adenopathy present.  Neurological: She is alert and oriented to person, place, and time. She has normal strength and normal reflexes. No cranial nerve deficit or sensory deficit. She displays a negative Romberg sign. Coordination and gait normal.  Skin: Skin is warm and dry. No abrasion, no bruising, no ecchymosis and no rash noted. No cyanosis. Nails show no clubbing.  Psychiatric: She has a normal mood and affect. Her speech is normal and behavior is normal.          Assessment & Plan:    1)  HM  See scanned HM sheet.  Education given for Shingles vaccine.  UTD mammogram and colonoscopy.   2)  Hyperlipidemia  Will resume Lipitor.  She is aware if any muscle aches she is to return to office and stop med 3) Hypothyroidism   Last TSH nl 4)  Elevated alk phos.  Liver U/S shows fatty liver 5) DJD 6)  Obesity I spent 45 minutes with this pt

## 2010-11-07 ENCOUNTER — Encounter: Payer: Self-pay | Admitting: Emergency Medicine

## 2011-03-05 ENCOUNTER — Other Ambulatory Visit: Payer: Self-pay | Admitting: Emergency Medicine

## 2011-03-05 DIAGNOSIS — E785 Hyperlipidemia, unspecified: Secondary | ICD-10-CM

## 2011-03-05 NOTE — Telephone Encounter (Signed)
Allison [name] called stating she received a message from [employer] that they have been waiting for approval for refill on Lipitor for 8 days.  I told her we have not seen any refill requests and it is likely going to DDS old office, but we will refill for her today.

## 2011-03-05 NOTE — Telephone Encounter (Signed)
Pt called back, stated that she contacted [employer] again, they have recvd authorization for refill

## 2011-03-19 ENCOUNTER — Encounter (HOSPITAL_BASED_OUTPATIENT_CLINIC_OR_DEPARTMENT_OTHER): Payer: Self-pay

## 2011-03-19 ENCOUNTER — Emergency Department (INDEPENDENT_AMBULATORY_CARE_PROVIDER_SITE_OTHER): Payer: 59

## 2011-03-19 ENCOUNTER — Telehealth: Payer: Self-pay | Admitting: Emergency Medicine

## 2011-03-19 ENCOUNTER — Emergency Department (HOSPITAL_BASED_OUTPATIENT_CLINIC_OR_DEPARTMENT_OTHER): Payer: 59

## 2011-03-19 ENCOUNTER — Emergency Department (HOSPITAL_BASED_OUTPATIENT_CLINIC_OR_DEPARTMENT_OTHER)
Admission: EM | Admit: 2011-03-19 | Discharge: 2011-03-19 | Disposition: A | Discharge status: Home / Self Care | Payer: 59 | Attending: Emergency Medicine | Admitting: Emergency Medicine

## 2011-03-19 DIAGNOSIS — M542 Cervicalgia: Secondary | ICD-10-CM | POA: Insufficient documentation

## 2011-03-19 DIAGNOSIS — R51 Headache: Secondary | ICD-10-CM

## 2011-03-19 DIAGNOSIS — E039 Hypothyroidism, unspecified: Secondary | ICD-10-CM | POA: Insufficient documentation

## 2011-03-19 DIAGNOSIS — E876 Hypokalemia: Secondary | ICD-10-CM | POA: Insufficient documentation

## 2011-03-19 DIAGNOSIS — E785 Hyperlipidemia, unspecified: Secondary | ICD-10-CM | POA: Insufficient documentation

## 2011-03-19 DIAGNOSIS — M47812 Spondylosis without myelopathy or radiculopathy, cervical region: Secondary | ICD-10-CM

## 2011-03-19 DIAGNOSIS — Z79899 Other long term (current) drug therapy: Secondary | ICD-10-CM | POA: Insufficient documentation

## 2011-03-19 LAB — BASIC METABOLIC PANEL
BUN: 13 mg/dL (ref 6–23)
CO2: 29 mEq/L (ref 19–32)
Calcium: 9.5 mg/dL (ref 8.4–10.5)
Creatinine, Ser: 0.7 mg/dL (ref 0.50–1.10)
GFR calc non Af Amer: >90 mL/min (ref >90)
Glucose, Bld: 135 mg/dL — ABNORMAL HIGH (ref 70–99)

## 2011-03-19 LAB — DIFFERENTIAL
Eosinophils Absolute: 0.1 10*3/uL (ref 0.0–0.7)
Eosinophils Relative: 1 % (ref 0–5)
Lymphocytes Relative: 14 % (ref 12–46)
Lymphs Abs: 1.5 10*3/uL (ref 0.7–4.0)
Monocytes Absolute: 1 10*3/uL (ref 0.1–1.0)
Monocytes Relative: 9 % (ref 3–12)

## 2011-03-19 LAB — CBC: Hemoglobin: 12.2 g/dL (ref 12.0–15.0)

## 2011-03-19 MED ORDER — CYCLOBENZAPRINE HCL 5 MG PO TABS: 5.0000 mg | ORAL_TABLET | Freq: Three times a day (TID) | ORAL | Status: AC | PRN

## 2011-03-19 MED ORDER — OXYCODONE-ACETAMINOPHEN 5-325 MG PO TABS
1.0000 | ORAL_TABLET | Freq: Once | ORAL | Status: AC
  Administered 2011-03-19: 1 via ORAL
  Filled 2011-03-19: qty 1

## 2011-03-19 MED ORDER — OXYCODONE-ACETAMINOPHEN 5-325 MG PO TABS: 1.0000 | ORAL_TABLET | ORAL | Status: DC | PRN

## 2011-03-19 MED ORDER — DIAZEPAM 5 MG PO TABS
5.0000 mg | ORAL_TABLET | Freq: Once | ORAL | Status: AC
  Administered 2011-03-19: 5 mg via ORAL
  Filled 2011-03-19: qty 1

## 2011-03-19 MED ORDER — OXYCODONE-ACETAMINOPHEN 10-650 MG PO TABS: 1.0000 | ORAL_TABLET | Freq: Four times a day (QID) | ORAL | Status: AC | PRN

## 2011-03-19 MED ORDER — IBUPROFEN 200 MG PO CAPS: 600.0000 mg | ORAL_CAPSULE | Freq: Four times a day (QID) | ORAL | Status: DC | PRN

## 2011-03-19 MED ORDER — CYCLOBENZAPRINE HCL 5 MG PO TABS: 5.0000 mg | ORAL_TABLET | Freq: Three times a day (TID) | ORAL | Status: DC | PRN

## 2011-03-19 NOTE — ED Notes (Signed)
C/o neck pain, HA x 3 days-pain is worse when moves side to side-denies injury-no n/v/visual disturbance-NAD at present

## 2011-03-19 NOTE — Telephone Encounter (Signed)
Allison [name] called this morning requesting a work-in appt today.  She states she had a viral illness last week "that I took care of on my own".  However, last night she began having stiff neck and headache.  This morning, she states she can barely move and feels like her head is going to "explode".  I advised her to go immediately to the emergency room this morning.  She was hesitant with my response and I explained that her symptoms sounded very much like meningitis and that we would not be able to treat her in the office.  I explained that she needs to be seen and evaluated in an ED setting where they can offer her urgent treatment.  She is agreeable and states she will come in to the [facility] ED immediately

## 2011-03-19 NOTE — ED Provider Notes (Signed)
History     CSN: [id number] Arrival date & time: 03/19/2011 11:11 AM   First MD Initiated Contact with Patient 03/19/11 1052      Chief Complaint  Patient presents with  . Neck Pain    (Consider location/radiation/quality/duration/timing/severity/associated sxs/prior treatment) HPI [name] is a 65 year old woman with PMH significant for hypothyroidism who presents with neck pain and stiffness x 2 days.   [name] states that her pain began on Saturday. She can identify no inciting event. She states that the pain began at the posterior base of her skull and progressively spread down the sides of her neck bilaterally. She states that now encompasses her entire neck without radiation. She states that her pain is progressively increasing. States the pain feels achy and burning. She denies any alleviating factors. She states the pain is worse with twisting or lateral flexion of the neck. She's been using Aleve and aspirin with no relief of her pain. The pain is severe 10 out of 10 at its peak currently 9/10. She denies any prior symptoms like this. States she has had a fever to 99.3 but was getting over a mild upper respiratory tract infection that she got over the past week. She still has some residual cough. Endorses a slight episode of dizziness on Saturday. Denies any tooth pain, sore throat, or difficulty swallowing. She does feel like her neck is swollen. Denies any current vision changes, blurry vision, double vision or loss of vision, dizziness, chills, weakness, paresthesias, numbness, or injury. Her friend states that her neck appears swollen and her face appears more red than normal. Denies any sick contacts.   She states she normally has a small amount of exertional dyspnea and this has not increased from normal.   Past Medical History  Diagnosis Date  . Diverticulosis   . Hyperlipidemia   . Thyroid disease     hypo  . Elevated serum alkaline phosphatase level   . Fatty  liver disease, nonalcoholic   . Obesity   . DJD (degenerative joint disease) of hip   . DJD (degenerative joint disease) of hip     s/P R hip replacement    Past Surgical History  Procedure Date  . Rotator cuff repair 1996, 1998, 2008  . Joint replacement 2004    R hip  . Toe fusion 2000's    Family History  Problem Relation Age of Onset  . Cancer Mother 17    colon  . Heart disease Father   . HIV Brother   . Hearing loss Daughter     History  Substance Use Topics  . Smoking status: Former Smoker -- 2.0 packs/day for 20 years    Types: Cigarettes    Quit date: 04/03/1983  . Smokeless tobacco: Never Used  . Alcohol Use: 3.5 oz/week    7 drink(s) per week    OB History    Grav Para Term Preterm Abortions TAB SAB Ect Mult Living                  Review of Systems  Constitutional: Positive for fever. Negative for chills, diaphoresis and fatigue.  HENT: Positive for facial swelling, neck pain and neck stiffness. Negative for hearing loss, ear pain, sore throat, drooling, trouble swallowing, voice change and sinus pressure.   Eyes: Negative for photophobia, pain, redness and visual disturbance.  Respiratory: Positive for cough and shortness of breath. Negative for chest tightness and stridor.   Cardiovascular: Positive for leg swelling.  Negative for chest pain and palpitations.  Gastrointestinal: Negative for nausea, vomiting, abdominal pain, diarrhea, constipation, blood in stool and abdominal distention.  Genitourinary: Negative for dysuria and difficulty urinating.  Musculoskeletal: Negative for back pain.  Skin: Positive for color change. Negative for rash.  Neurological: Positive for headaches. Negative for dizziness, tremors, facial asymmetry, speech difficulty, weakness, light-headedness and numbness.  Hematological: Negative for adenopathy.    Allergies  Review of patient's allergies indicates no known allergies.  Home Medications   Current Outpatient Rx    Name Route Sig Dispense Refill  . ATORVASTATIN CALCIUM 20 MG PO TABS Oral Take 1 tablet (20 mg total) by mouth daily. 90 tablet 1  . CALCIUM 1200 PO Oral Take 1 tablet by mouth daily.      Marland Kitchen VITAMIN D3 5000 UNITS PO TABS Oral Take 1 tablet by mouth daily.      . CYCLOBENZAPRINE HCL 5 MG PO TABS Oral Take 1 tablet (5 mg total) by mouth 3 (three) times daily as needed for muscle spasms. 30 tablet 0  . FUROSEMIDE 80 MG PO TABS Oral Take 80 mg by mouth daily.     . IBUPROFEN 200 MG PO CAPS Oral Take 3 capsules (600 mg total) by mouth every 6 (six) hours as needed. 120 each 0  . LEVOTHYROXINE SODIUM 125 MCG PO TABS Oral Take 125 mcg by mouth daily.      . OXYCODONE-ACETAMINOPHEN 10-650 MG PO TABS Oral Take 1 tablet by mouth every 6 (six) hours as needed for pain. 30 tablet 0  . POTASSIUM CHLORIDE CR 10 MEQ PO CPCR Oral Take 10 mEq by mouth 2 (two) times daily.      Marland Kitchen RABEPRAZOLE SODIUM 20 MG PO TBEC Oral Take 20 mg by mouth daily.        BP 112/50  Pulse 66  Temp(Src) 98.8 F (37.1 C) (Oral)  Resp 15  Ht 5\' 4"  (1.626 m)  Wt 212 lb (96.163 kg)  BMI 36.39 kg/m2  SpO2 100%  Physical Exam  Constitutional: She is oriented to person, place, and time. She appears well-developed and well-nourished. No distress.  HENT:  Head: Normocephalic and atraumatic. No trismus in the jaw.  Nose: Right sinus exhibits no maxillary sinus tenderness and no frontal sinus tenderness. Left sinus exhibits no maxillary sinus tenderness and no frontal sinus tenderness.  Mouth/Throat: Uvula is midline, oropharynx is clear and moist and mucous membranes are normal. No oral lesions. No dental abscesses, uvula swelling or dental caries. No oropharyngeal exudate, posterior oropharyngeal edema, posterior oropharyngeal erythema or tonsillar abscesses.  Eyes: Conjunctivae and EOM are normal. Pupils are equal, round, and reactive to light. No scleral icterus.  Fundoscopic exam:      The right eye shows no papilledema.        The left eye shows no papilledema.  Neck: Phonation normal. Neck supple. Normal carotid pulses and no JVD present. No spinous process tenderness and no muscular tenderness present. Carotid bruit is not present. No rigidity. Erythema present. No tracheal deviation and normal range of motion present. Brudzinski's sign and Kernig's sign noted. No mass present.    Cardiovascular: Normal rate, regular rhythm, normal heart sounds and intact distal pulses.  Exam reveals no gallop and no friction rub.   No murmur heard. Pulmonary/Chest: Effort normal and breath sounds normal. No stridor. No respiratory distress. She has no wheezes. She has no rales. She exhibits no tenderness.  Musculoskeletal: She exhibits edema. She exhibits no tenderness.  Cervical back: She exhibits no tenderness, no bony tenderness and no edema.  Lymphadenopathy:    She has no cervical adenopathy.  Neurological: She is alert and oriented to person, place, and time. She has normal strength. She displays normal reflexes. No cranial nerve deficit or sensory deficit. She exhibits normal muscle tone. Coordination normal. GCS eye subscore is 4. GCS verbal subscore is 5. GCS motor subscore is 6. She displays Babinski's sign on the right side. She displays no Babinski's sign on the left side.  Reflex Scores:      Bicep reflexes are 2+ on the right side and 2+ on the left side.      Brachioradialis reflexes are 2+ on the right side and 2+ on the left side.      Patellar reflexes are 2+ on the right side and 2+ on the left side.      Achilles reflexes are 2+ on the right side and 2+ on the left side. Skin: Skin is warm and dry. No rash noted. She is not diaphoretic. There is erythema. No pallor.     Psychiatric: She has a normal mood and affect. Her behavior is normal. Judgment and thought content normal.    ED Course  Procedures (including critical care time)  Labs Reviewed  CBC - Abnormal; Notable for the following:    WBC 11.3  (*)    All other components within normal limits  DIFFERENTIAL - Abnormal; Notable for the following:    Neutro Abs 8.6 (*)    All other components within normal limits  BASIC METABOLIC PANEL - Abnormal; Notable for the following:    Potassium 3.3 (*)    Glucose, Bld 135 (*)    All other components within normal limits   Dg Cervical Spine Complete  03/19/2011  *RADIOLOGY REPORT*  Clinical Data: Neck stiffness and headache for 3 days.  CERVICAL SPINE - COMPLETE 4+ VIEW  Comparison: None.  Findings: There is no visible cervical spine fracture or traumatic subluxation.  Disc space narrowing is most notable at C5-6 and C6- 7.  Neural foraminal narrowing is seen at C6-7 bilaterally related to disc space narrowing and osteophyte formation.  The odontoid is unremarkable.  AP view of the cervical spine shows normal lung apices.  IMPRESSION: Cervical spondylosis, worst at C6-7 and C5-6.  Original Report Authenticated By: Elsie Stain, M.D.     1. Neck pain, acute   2. Hypokalemia       MDM  The differential is broad at this point for Ms. barrier's neck pain and stiffness. Cervical MSK pain is the most likely diagnosis.  Meningitis is unlikely as patient is afebrile. Soft tissue infection would not. Concern for Ludwig's angina is low as the submandibular tissues are soft and there is no sublingual erythema. No obvious dental infection or pain. Will obtain basic labs, plain films and treat pain for now.    2:20 - Pain much improved with Percocet and Valium. Pain is likely secondary to cervical disc disease seen on plain film. Leukocytosis is very mild and likely secondary to resolving URI. Will discharge with muscle relaxants, pain control, and close PCP followup.      Quentin Ore, MD 03/19/11 1440

## 2011-03-19 NOTE — ED Provider Notes (Addendum)
I saw and evaluated the patient, reviewed the resident's note and I agree with the findings and plan.  Doubt meningitis. Afebrile, well appearing. Normal upper and lower extremity neuro exam. Suspect DJD of the neck. No indication for MRI today. Doubt epidural abscess. No dental pain to suggest odontogenic infection. Submandibular and sublingular spaces are normal  Dg Cervical Spine Complete  03/19/2011  *RADIOLOGY REPORT*  Clinical Data: Neck stiffness and headache for 3 days.  CERVICAL SPINE - COMPLETE 4+ VIEW  Comparison: None.  Findings: There is no visible cervical spine fracture or traumatic subluxation.  Disc space narrowing is most notable at C5-6 and C6- 7.  Neural foraminal narrowing is seen at C6-7 bilaterally related to disc space narrowing and osteophyte formation.  The odontoid is unremarkable.  AP view of the cervical spine shows normal lung apices.  IMPRESSION: Cervical spondylosis, worst at C6-7 and C5-6.  Original Report Authenticated By: Elsie Stain, M.D.   I personally evaluated the xray  Results for orders placed during the hospital encounter of 03/19/11  CBC      Component Value Range   WBC 11.3 (*) 4.0 - 10.5 (K/uL)   RBC 4.37  3.87 - 5.11 (MIL/uL)   Hemoglobin 12.2  12.0 - 15.0 (g/dL)   HCT 04.5  40.9 - 81.1 (%)   MCV 87.6  78.0 - 100.0 (fL)   MCH 27.9  26.0 - 34.0 (pg)   MCHC 31.9  30.0 - 36.0 (g/dL)   RDW 91.4  78.2 - 95.6 (%)   Platelets 317  150 - 400 (K/uL)  DIFFERENTIAL      Component Value Range   Neutrophils Relative 76  43 - 77 (%)   Neutro Abs 8.6 (*) 1.7 - 7.7 (K/uL)   Lymphocytes Relative 14  12 - 46 (%)   Lymphs Abs 1.5  0.7 - 4.0 (K/uL)   Monocytes Relative 9  3 - 12 (%)   Monocytes Absolute 1.0  0.1 - 1.0 (K/uL)   Eosinophils Relative 1  0 - 5 (%)   Eosinophils Absolute 0.1  0.0 - 0.7 (K/uL)   Basophils Relative 0  0 - 1 (%)   Basophils Absolute 0.0  0.0 - 0.1 (K/uL)  BASIC METABOLIC PANEL      Component Value Range   Sodium 139  135 - 145  (mEq/L)   Potassium 3.3 (*) 3.5 - 5.1 (mEq/L)   Chloride 100  96 - 112 (mEq/L)   CO2 29  19 - 32 (mEq/L)   Glucose, Bld 135 (*) 70 - 99 (mg/dL)   BUN 13  6 - 23 (mg/dL)   Creatinine, Ser 2.13  0.50 - 1.10 (mg/dL)   Calcium 9.5  8.4 - 08.6 (mg/dL)   GFR calc non Af Amer >90  >90 (mL/min)   GFR calc Af Amer >90  >90 (mL/min)     Lyanne Co, MD 03/19/11 2204  Lyanne Co, MD 03/19/11 2204

## 2011-06-18 ENCOUNTER — Other Ambulatory Visit: Payer: Self-pay | Admitting: Internal Medicine

## 2011-06-18 DIAGNOSIS — E876 Hypokalemia: Secondary | ICD-10-CM | POA: Insufficient documentation

## 2011-06-18 DIAGNOSIS — E785 Hyperlipidemia, unspecified: Secondary | ICD-10-CM

## 2011-06-18 MED ORDER — ATORVASTATIN CALCIUM 20 MG PO TABS: 20.0000 mg | ORAL_TABLET | Freq: Every day | ORAL | Status: DC

## 2011-06-18 MED ORDER — POTASSIUM CHLORIDE ER 10 MEQ PO CPCR: 10.0000 meq | ORAL_CAPSULE | Freq: Two times a day (BID) | ORAL | Status: DC

## 2011-06-18 NOTE — Telephone Encounter (Signed)
Needs Potassium and Lipitor sent to [facility] order, 90 days.  She requests 1 year refills if possible.  Will also need 2 week supply of potassium called to local pharmacy until mail order arrives.

## 2011-06-18 NOTE — Telephone Encounter (Signed)
Have pt come in for a CMP.  Her last check was in December when her K was low .  I do not have one since.  Will order 90 days worth

## 2011-06-18 NOTE — Telephone Encounter (Signed)
Pt needs a refill sent to [facility] ([address] in [place], [place]) right away she is out; its the Potassium Chloride (Cap CR) MICRO-K 10 MEQ Take 10 mEq by mouth 2 (two) times daily.   Then she needs the same medication sent to [facility]; be available to receive Potassium Chloride three months at a time. Then the Atorvastatin Calcium (Tab) LIPITOR 20 MG Take 1 tablet (20 mg total) by mouth daily; needs to be change to where she is getting it 3 months at a time and not every month.   If you have any questions please call pt at [phone]; thanks

## 2011-06-18 NOTE — Telephone Encounter (Signed)
Left message for [name] to come in for labs this week, orders entered

## 2011-06-18 NOTE — Telephone Encounter (Signed)
Addended by: Chip Boer on: 06/18/2011 02:17 PM   Modules accepted: Orders

## 2011-06-25 ENCOUNTER — Telehealth: Payer: Self-pay | Admitting: Internal Medicine

## 2011-06-25 NOTE — Telephone Encounter (Signed)
Pt never came in for [facility]  Call her and tellher to get this done  Thanks

## 2011-06-25 NOTE — Telephone Encounter (Signed)
Left message with female at home number for Allison [name] to return call to the office

## 2011-06-25 NOTE — Telephone Encounter (Signed)
Spoke with [employer].  She states she will try to go this week.  She lives in [place] and works in [place].  I offered to try to find her a draw station closer to work or home, but she states she will try to slip out of work early one day to come for labs this week

## 2011-06-27 LAB — COMPREHENSIVE METABOLIC PANEL
AST: 20 U/L (ref 0–37)
Albumin: 4.2 g/dL (ref 3.5–5.2)
Alkaline Phosphatase: 126 U/L — ABNORMAL HIGH (ref 39–117)
BUN: 28 mg/dL — ABNORMAL HIGH (ref 6–23)
Creat: 0.83 mg/dL (ref 0.50–1.10)
Glucose, Bld: 103 mg/dL — ABNORMAL HIGH (ref 70–99)
Potassium: 4 mEq/L (ref 3.5–5.3)
Total Bilirubin: 0.5 mg/dL (ref 0.3–1.2)

## 2011-06-28 ENCOUNTER — Encounter: Payer: Self-pay | Admitting: Emergency Medicine

## 2011-07-24 ENCOUNTER — Telehealth: Payer: Self-pay | Admitting: Internal Medicine

## 2011-07-24 MED ORDER — RABEPRAZOLE SODIUM 20 MG PO TBEC: 20.0000 mg | DELAYED_RELEASE_TABLET | Freq: Every day | ORAL | Status: DC

## 2011-07-24 MED ORDER — LEVOTHYROXINE SODIUM 125 MCG PO TABS: 125.0000 ug | ORAL_TABLET | Freq: Every day | ORAL | Status: DC

## 2011-07-24 NOTE — Telephone Encounter (Signed)
Allison [name]  Ok to call pt and tell her meds were ordered

## 2011-07-24 NOTE — Telephone Encounter (Signed)
Pt needs her 90 day script sent to [facility] on Levothyroxine Sodium (Tab) SYNTHROID, LEVOTHROID 125 MCG Take 125 mcg by mouth daily.  And RABEprazole Sodium (Tablet Delayed Response) ACIPHEX 20 MG Take 20 mg by mouth daily. She states once its done can she please be inform [phone] (it's ok to leave a message)

## 2011-09-04 ENCOUNTER — Other Ambulatory Visit: Payer: Self-pay | Admitting: Internal Medicine

## 2011-09-04 DIAGNOSIS — Z1231 Encounter for screening mammogram for malignant neoplasm of breast: Secondary | ICD-10-CM

## 2011-09-09 ENCOUNTER — Encounter: Payer: Self-pay | Admitting: Internal Medicine

## 2011-09-09 DIAGNOSIS — E669 Obesity, unspecified: Secondary | ICD-10-CM | POA: Insufficient documentation

## 2011-09-09 DIAGNOSIS — Z800 Family history of malignant neoplasm of digestive organs: Secondary | ICD-10-CM | POA: Insufficient documentation

## 2011-10-01 ENCOUNTER — Other Ambulatory Visit: Payer: Self-pay | Admitting: *Deleted

## 2011-10-02 MED ORDER — RABEPRAZOLE SODIUM 20 MG PO TBEC: 20.0000 mg | DELAYED_RELEASE_TABLET | Freq: Every day | ORAL | Status: DC

## 2011-10-02 MED ORDER — FUROSEMIDE 80 MG PO TABS: 80.0000 mg | ORAL_TABLET | Freq: Every day | ORAL | Status: DC

## 2011-10-02 NOTE — Telephone Encounter (Signed)
Pt requesting RF of 90 day supply sent to new pharmacy

## 2011-10-05 ENCOUNTER — Ambulatory Visit
Admission: RE | Admit: 2011-10-05 | Discharge: 2011-10-05 | Disposition: A | Discharge status: Home / Self Care | Payer: 59 | Source: Ambulatory Visit | Attending: Internal Medicine | Admitting: Internal Medicine

## 2011-10-05 DIAGNOSIS — Z1231 Encounter for screening mammogram for malignant neoplasm of breast: Secondary | ICD-10-CM

## 2011-11-01 ENCOUNTER — Encounter: Payer: 59 | Admitting: Internal Medicine

## 2011-11-14 ENCOUNTER — Telehealth: Payer: Self-pay | Admitting: Internal Medicine

## 2011-11-14 DIAGNOSIS — Z0000 Encounter for general adult medical examination without abnormal findings: Secondary | ICD-10-CM

## 2011-11-14 NOTE — Telephone Encounter (Signed)
Pt wants to come in Monday for fasting labs before her PE on Thursday.  Lab requisition printed for pt to come in fasting.

## 2011-11-14 NOTE — Telephone Encounter (Signed)
Pt would like to have her blood work done Monday coming up... She has her physical on next week Thursday... Pt was advise that message will be sent over and a phone call will be made to her to confirm Monday is fine..  Thanks

## 2011-11-19 ENCOUNTER — Telehealth: Payer: Self-pay | Admitting: *Deleted

## 2011-11-19 DIAGNOSIS — Z0000 Encounter for general adult medical examination without abnormal findings: Secondary | ICD-10-CM

## 2011-11-19 LAB — LIPID PANEL
Cholesterol: 195 mg/dL (ref 0–200)
HDL: 56 mg/dL (ref >39)
Total CHOL/HDL Ratio: 3.5 Ratio
VLDL: 30 mg/dL (ref 0–40)

## 2011-11-19 LAB — CBC WITH DIFFERENTIAL/PLATELET
Eosinophils Absolute: 0.2 10*3/uL (ref 0.0–0.7)
HCT: 41.5 % (ref 36.0–46.0)
Hemoglobin: 13.9 g/dL (ref 12.0–15.0)
Lymphs Abs: 1.9 10*3/uL (ref 0.7–4.0)
MCH: 29.5 pg (ref 26.0–34.0)
Monocytes Absolute: 0.5 10*3/uL (ref 0.1–1.0)
Monocytes Relative: 6 % (ref 3–12)
Neutrophils Relative %: 68 % (ref 43–77)
RBC: 4.71 MIL/uL (ref 3.87–5.11)

## 2011-11-19 LAB — COMPLETE METABOLIC PANEL WITH GFR
Alkaline Phosphatase: 112 U/L (ref 39–117)
BUN: 15 mg/dL (ref 6–23)
GFR, Est Non African American: 81 mL/min
Glucose, Bld: 106 mg/dL — ABNORMAL HIGH (ref 70–99)
Sodium: 142 mEq/L (ref 135–145)
Total Bilirubin: 0.7 mg/dL (ref 0.3–1.2)

## 2011-11-19 LAB — TSH: TSH: 1.067 u[IU]/mL (ref 0.350–4.500)

## 2011-11-19 NOTE — Telephone Encounter (Signed)
Pt request urinalysis today for her annual on thursday

## 2011-11-20 LAB — URINALYSIS, ROUTINE W REFLEX MICROSCOPIC
Leukocytes, UA: NEGATIVE
Nitrite: NEGATIVE
Specific Gravity, Urine: 1.015 (ref 1.005–1.030)
Urobilinogen, UA: 1 mg/dL (ref 0.0–1.0)

## 2011-11-20 LAB — VITAMIN D 25 HYDROXY (VIT D DEFICIENCY, FRACTURES): Vit D, 25-Hydroxy: 52 ng/mL (ref 30–89)

## 2011-11-20 NOTE — Telephone Encounter (Signed)
Pt to get copy of her labs on Thursday when she comes in for her annual exam.

## 2011-11-22 ENCOUNTER — Encounter: Payer: Self-pay | Admitting: Internal Medicine

## 2011-11-22 ENCOUNTER — Ambulatory Visit (INDEPENDENT_AMBULATORY_CARE_PROVIDER_SITE_OTHER): Payer: 59 | Admitting: Internal Medicine

## 2011-11-22 VITALS — BP 144/88 | HR 68 | Temp 97.0°F | Resp 16 | Ht 64.0 in | Wt 224.0 lb

## 2011-11-22 DIAGNOSIS — E079 Disorder of thyroid, unspecified: Secondary | ICD-10-CM

## 2011-11-22 DIAGNOSIS — Z23 Encounter for immunization: Secondary | ICD-10-CM

## 2011-11-22 DIAGNOSIS — K760 Fatty (change of) liver, not elsewhere classified: Secondary | ICD-10-CM

## 2011-11-22 DIAGNOSIS — K5730 Diverticulosis of large intestine without perforation or abscess without bleeding: Secondary | ICD-10-CM

## 2011-11-22 DIAGNOSIS — Z800 Family history of malignant neoplasm of digestive organs: Secondary | ICD-10-CM

## 2011-11-22 DIAGNOSIS — R062 Wheezing: Secondary | ICD-10-CM

## 2011-11-22 DIAGNOSIS — E785 Hyperlipidemia, unspecified: Secondary | ICD-10-CM

## 2011-11-22 DIAGNOSIS — L509 Urticaria, unspecified: Secondary | ICD-10-CM

## 2011-11-22 DIAGNOSIS — E669 Obesity, unspecified: Secondary | ICD-10-CM

## 2011-11-22 DIAGNOSIS — K5790 Diverticulosis of intestine, part unspecified, without perforation or abscess without bleeding: Secondary | ICD-10-CM

## 2011-11-22 DIAGNOSIS — K7689 Other specified diseases of liver: Secondary | ICD-10-CM

## 2011-11-22 DIAGNOSIS — Z0000 Encounter for general adult medical examination without abnormal findings: Secondary | ICD-10-CM

## 2011-11-22 DIAGNOSIS — J9801 Acute bronchospasm: Secondary | ICD-10-CM | POA: Insufficient documentation

## 2011-11-22 MED ORDER — ALBUTEROL SULFATE HFA 108 (90 BASE) MCG/ACT IN AERS: INHALATION_SPRAY | RESPIRATORY_TRACT | Status: DC

## 2011-11-22 MED ORDER — LEVOTHYROXINE SODIUM 125 MCG PO TABS: 125.0000 ug | ORAL_TABLET | Freq: Every day | ORAL | Status: DC

## 2011-11-22 MED ORDER — ATORVASTATIN CALCIUM 20 MG PO TABS: 20.0000 mg | ORAL_TABLET | Freq: Every day | ORAL | Status: DC

## 2011-11-22 MED ORDER — PNEUMOCOCCAL 13-VAL CONJ VACC IM SUSP: 0.5000 mL | INTRAMUSCULAR | Status: AC

## 2011-11-22 MED ORDER — FUROSEMIDE 80 MG PO TABS: 80.0000 mg | ORAL_TABLET | Freq: Every day | ORAL | Status: DC

## 2011-11-22 MED ORDER — PREDNISONE 20 MG PO TABS: ORAL_TABLET | ORAL | Status: DC

## 2011-11-22 MED ORDER — ALBUTEROL SULFATE (5 MG/ML) 0.5% IN NEBU
2.5000 mg | INHALATION_SOLUTION | Freq: Once | RESPIRATORY_TRACT | Status: AC
  Administered 2011-11-22: 2.5 mg via RESPIRATORY_TRACT

## 2011-11-22 MED ORDER — POTASSIUM CHLORIDE ER 10 MEQ PO CPCR: ORAL_CAPSULE | ORAL | Status: DC

## 2011-11-22 NOTE — Progress Notes (Signed)
Subjective:    Patient ID: Allison [name], female    DOB: 12-Feb-1946, 66 y.o.   MRN: [id number]  HPI Allison [name] is here for comprehensive eval.  Overall doing well but reports 3 weeks ago Allison [name] stared with wheezing at night.  Allison [name] was at a cook-out and was exposed to 16 cats - started wheezing at night.  Allison [name] has been using albuterol inhaler.  Initially given inhaler 12-18 months ago by Dr. Celene Skeen when Allison [name] was wheezing when Allison [name] had a cold.  Chest feels tight when Allison [name] wheezes  Allison [name] also  Reports intermittant hives that occur 2 times per month for the past one year..  Hives began after her  6/12 visit.  Allison [name] reports.  Dr. Stefanie Libel her dermatologist told her Allison [name] had insect bites and was treated with oral steroids  Allison [name] brings log of BP's outpt. bp ranges 127-92 systolic to  58-68 diastolic.    Allison [name] has no headaches, or chest pain  No Known Allergies Past Medical History  Diagnosis Date  . Diverticulosis   . Hyperlipidemia   . Thyroid disease     hypo  . Elevated serum alkaline phosphatase level   . Fatty liver disease, nonalcoholic   . Obesity   . DJD (degenerative joint disease) of hip   . DJD (degenerative joint disease) of hip     s/P R hip replacement   Past Surgical History  Procedure Date  . Rotator cuff repair 1996, 1998, 2008  . Joint replacement 2004    R hip  . Toe fusion 2000's   History   Social History  . Marital Status: Married    Spouse Name: N/A    Number of Children: N/A  . Years of Education: N/A   Occupational History  . Not on file.   Social History Main Topics  . Smoking status: Former Smoker -- 2.0 packs/day for 20 years    Types: Cigarettes    Quit date: 04/03/1983  . Smokeless tobacco: Never Used  . Alcohol Use: 3.5 oz/week    7 drink(s) per week  . Drug Use: No  . Sexually Active: Not on file   Other Topics Concern  . Not on file   Social History Narrative  . No narrative on file   Family History  Problem Relation Age of Onset  . Cancer Mother 18    colon  . Heart disease Father   . HIV Brother   . Hearing loss Daughter    Patient Active Problem List  Diagnosis  . Hyperlipidemia  . Thyroid disease  . Diverticulosis  . Elevated serum alkaline phosphatase level  . Fatty liver disease, nonalcoholic  . DJD (degenerative joint disease) of hip  . Hypokalemia  . Family history of colon cancer  . Obesity   Current Outpatient Prescriptions on File Prior to Visit  Medication Sig Dispense Refill  . albuterol (PROVENTIL HFA;VENTOLIN HFA) 108 (90 BASE) MCG/ACT inhaler Inhale 2 puffs into the lungs every 6 (six) hours as needed.      Marland Kitchen atorvastatin (LIPITOR) 20 MG tablet Take 1 tablet (20 mg total) by mouth daily.  90 tablet  1  . Calcium Carbonate-Vit D-Min (CALCIUM 1200 PO) Take 1 tablet by mouth daily.        . Cholecalciferol (VITAMIN D3) 5000 UNITS TABS Take 1 tablet by mouth daily.        . furosemide (LASIX) 80 MG tablet Take 1 tablet (80 mg total) by mouth daily.  90 tablet  1  .  Ibuprofen 200 MG CAPS Take 3 capsules (600 mg total) by mouth every 6 (six) hours as needed.  120 each  0  . levothyroxine (SYNTHROID, LEVOTHROID) 125 MCG tablet Take 1 tablet (125 mcg total) by mouth daily.  90 tablet  1  . potassium chloride (MICRO-K) 10 MEQ CR capsule Take 1 capsule (10 mEq total) by mouth 2 (two) times daily.  30 capsule  0  . RABEprazole (ACIPHEX) 20 MG tablet Take 1 tablet (20 mg total) by mouth daily.  90 tablet  1       Review of Systems    see HPI Objective:   Physical Exam Physical Exam  Vital signs and nursing note reviewed   Peak flow 400 Constitutional: Allison [name] is oriented to person, place, and time. Allison [name] appears well-developed and well-nourished. Allison [name] is cooperative.  HENT:  Head: Normocephalic and atraumatic.  Right Ear: Tympanic membrane normal.  Left Ear: Tympanic membrane normal.  Nose: Nose normal.  Mouth/Throat: Oropharynx is clear and moist and mucous membranes are normal. No oropharyngeal exudate or posterior  oropharyngeal erythema.  Eyes: Conjunctivae and EOM are normal. Pupils are equal, round, and reactive to light.  Neck: Neck supple. No JVD present. Carotid bruit is not present. No mass and no thyromegaly present.  Cardiovascular: Regular rhythm, normal heart sounds, intact distal pulses and normal pulses.  Exam reveals no gallop and no friction rub.   No murmur heard. Pulses:      Dorsalis pedis pulses are 2+ on the right side, and 2+ on the left side.  Pulmonary/Chest: Breath sounds normal. Allison [name] has no wheezes. Allison [name] has no rhonchi. Allison [name] has no rales. Right breast exhibits no mass, no nipple discharge and no skin change. Left breast exhibits no mass, no nipple discharge and no skin change.  Abdominal: Soft. Bowel sounds are normal. Allison [name] exhibits no distension and no mass. There is no hepatosplenomegaly. There is no tenderness. There is no CVA tenderness.  Genitourinary: Rectum normal, vagina normal and uterus normal. Rectal exam shows no mass. Guaiac negative stool. No labial fusion. There is no lesion on the right labia. There is no lesion on the left labia. Cervix exhibits no motion tenderness. Right adnexum displays no mass, no tenderness and no fullness. Left adnexum displays no mass, no tenderness and no fullness. No erythema around the vagina.  Musculoskeletal:       No active synovitis to any joint.    Lymphadenopathy:       Right cervical: No superficial cervical adenopathy present.      Left cervical: No superficial cervical adenopathy present.       Right axillary: No pectoral and no lateral adenopathy present.       Left axillary: No pectoral and no lateral adenopathy present.      Right: No inguinal adenopathy present.       Left: No inguinal adenopathy present.  Neurological: Allison [name] is alert and oriented to person, place, and time. Allison [name] has normal strength and normal reflexes. No cranial nerve deficit or sensory deficit. Allison [name] displays a negative Romberg sign. Coordination and gait normal.    Skin: Skin is warm and dry. No abrasion, no bruising, no ecchymosis and no rash noted. No cyanosis. Nails show no clubbing. No urticarial lesions todday Psychiatric: Allison [name] has a normal mood and affect. Her speech is normal and behavior is normal.          Assessment & Plan:  HM  See scanned HM sheet today.  Will give pneumonia vaccine  today.    Bronchospasm;  Will give taper dose of Prednisone 40 mg taper q 4 days.  Albuterol Tid as needed.  See me 5-6 weeks.  Possible extrinsic related ??cats  Urticarial lesions,  With facial /oral symptoms concern for angioedema.  Counseled to make appt with Dr.Strauss and if any facial or throat swelling re-occurs to go to ER or urgent care  Elevated BP  outpt bp'sd all normal.  Likely element of white coat syndrome  Hypothryoidism  Continue current med  Hyperlipidemia  Continue current meds  NASH  Morbid obesity  Diverticulosis  FH of colon CA  Due for screening 2016  I spent 4 with this pt     Assessment & Plan:

## 2011-11-22 NOTE — Addendum Note (Signed)
Addended by: Raechel Chute D on: 11/22/2011 04:22 PM   Modules accepted: Orders

## 2011-11-22 NOTE — Patient Instructions (Addendum)
See me 5-6 weeks 

## 2011-12-12 ENCOUNTER — Other Ambulatory Visit: Payer: Self-pay | Admitting: *Deleted

## 2011-12-12 MED ORDER — ALBUTEROL SULFATE HFA 108 (90 BASE) MCG/ACT IN AERS: INHALATION_SPRAY | RESPIRATORY_TRACT | Status: DC

## 2011-12-12 NOTE — Telephone Encounter (Signed)
Requesting  3 refills for inhaler due to insurance purposes

## 2012-01-03 ENCOUNTER — Ambulatory Visit: Payer: 59 | Admitting: Internal Medicine

## 2012-02-18 ENCOUNTER — Other Ambulatory Visit: Payer: Self-pay | Admitting: *Deleted

## 2012-02-18 MED ORDER — LEVOTHYROXINE SODIUM 125 MCG PO TABS: 125.0000 ug | ORAL_TABLET | Freq: Every day | ORAL | Status: DC

## 2012-02-18 MED ORDER — RABEPRAZOLE SODIUM 20 MG PO TBEC: 20.0000 mg | DELAYED_RELEASE_TABLET | Freq: Every day | ORAL | Status: DC

## 2012-02-18 NOTE — Telephone Encounter (Signed)
Needs refills

## 2012-02-25 ENCOUNTER — Other Ambulatory Visit: Payer: Self-pay | Admitting: *Deleted

## 2012-02-25 DIAGNOSIS — E785 Hyperlipidemia, unspecified: Secondary | ICD-10-CM

## 2012-02-25 MED ORDER — ATORVASTATIN CALCIUM 20 MG PO TABS: 20.0000 mg | ORAL_TABLET | Freq: Every day | ORAL | Status: DC

## 2012-02-25 MED ORDER — FUROSEMIDE 80 MG PO TABS: 80.0000 mg | ORAL_TABLET | Freq: Every day | ORAL | Status: DC

## 2012-02-25 NOTE — Telephone Encounter (Signed)
Needs refill

## 2012-02-25 NOTE — Telephone Encounter (Deleted)
Needs to be resent to [facility] I cancelled [facility] order but was unable to reach a human to call it in

## 2012-04-09 ENCOUNTER — Other Ambulatory Visit: Payer: Self-pay | Admitting: Internal Medicine

## 2012-04-09 NOTE — Telephone Encounter (Signed)
Pt needs refills sent [facility]: RABEprazole (ACIPHEX) 20 MG tablet levothyroxine (SYNTHROID, LEVOTHROID) 125 MCG tablet potassium chloride (MICRO-K) 10 MEQ CR capsule: pt states the doctor states she was going to increase her to taking three pills a day... Pt states she recd a letter stating she does not have prescriptions refills left for the medications above.. If there any questions please call pt at work [phone].Marland KitchenMarland Kitchen

## 2012-04-10 MED ORDER — RABEPRAZOLE SODIUM 20 MG PO TBEC: 20.0000 mg | DELAYED_RELEASE_TABLET | Freq: Every day | ORAL | Status: DC

## 2012-04-10 MED ORDER — LEVOTHYROXINE SODIUM 125 MCG PO TABS: 125.0000 ug | ORAL_TABLET | Freq: Every day | ORAL | Status: DC

## 2012-04-10 MED ORDER — POTASSIUM CHLORIDE ER 10 MEQ PO CPCR: ORAL_CAPSULE | ORAL | Status: DC

## 2012-04-10 NOTE — Telephone Encounter (Signed)
Refill request

## 2012-04-15 ENCOUNTER — Other Ambulatory Visit: Payer: Self-pay | Admitting: *Deleted

## 2012-04-15 NOTE — Telephone Encounter (Signed)
Pt needs 90 more tablets sent in due to you changing her dose to TID

## 2012-04-16 MED ORDER — POTASSIUM CHLORIDE ER 10 MEQ PO CPCR: ORAL_CAPSULE | ORAL | Status: DC

## 2012-05-07 ENCOUNTER — Other Ambulatory Visit: Payer: Self-pay | Admitting: Internal Medicine

## 2012-05-07 MED ORDER — ALBUTEROL SULFATE HFA 108 (90 BASE) MCG/ACT IN AERS: INHALATION_SPRAY | RESPIRATORY_TRACT | Status: DC

## 2012-05-07 NOTE — Telephone Encounter (Signed)
See Ardenia's note 

## 2012-05-07 NOTE — Telephone Encounter (Signed)
Pt needs refill of albuterol (PROVENTIL HFA;VENTOLIN HFA) 108 (90 BASE) MCG/ACT inhaler She states she needs it sent to [facility] ... She states she needs a year script; it needs to be sent to her to where she pays for two months and receive her third month for free... If there any questions please feel free to call pt at [phone]

## 2012-08-11 ENCOUNTER — Other Ambulatory Visit: Payer: Self-pay | Admitting: Orthopaedic Surgery

## 2012-08-11 DIAGNOSIS — M79662 Pain in left lower leg: Secondary | ICD-10-CM

## 2012-08-12 ENCOUNTER — Ambulatory Visit
Admission: RE | Admit: 2012-08-12 | Discharge: 2012-08-12 | Disposition: A | Discharge status: Home / Self Care | Payer: 59 | Source: Ambulatory Visit | Attending: Orthopaedic Surgery | Admitting: Orthopaedic Surgery

## 2012-08-12 ENCOUNTER — Other Ambulatory Visit: Payer: 59

## 2012-08-12 DIAGNOSIS — M79662 Pain in left lower leg: Secondary | ICD-10-CM

## 2012-09-01 ENCOUNTER — Other Ambulatory Visit: Payer: Self-pay | Admitting: Internal Medicine

## 2012-09-01 NOTE — Telephone Encounter (Signed)
Refill request

## 2012-09-04 ENCOUNTER — Other Ambulatory Visit: Payer: Self-pay | Admitting: Internal Medicine

## 2012-09-04 MED ORDER — FUROSEMIDE 80 MG PO TABS: 80.0000 mg | ORAL_TABLET | Freq: Every day | ORAL | Status: DC

## 2012-09-04 MED ORDER — POTASSIUM CHLORIDE ER 10 MEQ PO CPCR: ORAL_CAPSULE | ORAL | Status: DC

## 2012-09-04 MED ORDER — ATORVASTATIN CALCIUM 20 MG PO TABS: 20.0000 mg | ORAL_TABLET | Freq: Every day | ORAL | Status: DC

## 2012-09-04 NOTE — Telephone Encounter (Signed)
Refill request

## 2012-09-04 NOTE — Telephone Encounter (Signed)
Pt needs refills for Furofemide 80 mg Qty 90, Potassium MEQ, & Aetorvastatin Tab 20 mg Qty 90.  Needs to be called into [facility] [phone].  Pt call back phone number [phone].

## 2012-09-04 NOTE — Telephone Encounter (Signed)
Allison [name]  Call pt and tell her that I sent a 90 day supply but she needs to see me in office on or before 10/2012

## 2012-09-09 ENCOUNTER — Other Ambulatory Visit: Payer: Self-pay

## 2012-09-09 DIAGNOSIS — Z1231 Encounter for screening mammogram for malignant neoplasm of breast: Secondary | ICD-10-CM

## 2012-09-30 NOTE — Telephone Encounter (Signed)
Pt will come in before 8/20 to have labs drawn

## 2012-10-09 ENCOUNTER — Ambulatory Visit
Admission: RE | Admit: 2012-10-09 | Discharge: 2012-10-09 | Disposition: A | Discharge status: Home / Self Care | Payer: 59 | Source: Ambulatory Visit

## 2012-10-09 DIAGNOSIS — Z1231 Encounter for screening mammogram for malignant neoplasm of breast: Secondary | ICD-10-CM

## 2012-12-08 ENCOUNTER — Telehealth: Payer: Self-pay | Admitting: *Deleted

## 2012-12-08 DIAGNOSIS — E079 Disorder of thyroid, unspecified: Secondary | ICD-10-CM

## 2012-12-08 DIAGNOSIS — E785 Hyperlipidemia, unspecified: Secondary | ICD-10-CM

## 2012-12-08 DIAGNOSIS — R748 Abnormal levels of other serum enzymes: Secondary | ICD-10-CM

## 2012-12-08 DIAGNOSIS — E876 Hypokalemia: Secondary | ICD-10-CM

## 2012-12-08 DIAGNOSIS — E669 Obesity, unspecified: Secondary | ICD-10-CM

## 2012-12-08 DIAGNOSIS — Z800 Family history of malignant neoplasm of digestive organs: Secondary | ICD-10-CM

## 2012-12-08 NOTE — Telephone Encounter (Signed)
Allison [name] would like to come in first thing tomorrow (12/09/12) for her lab work.  Can you have her lab orders ready for her?

## 2012-12-08 NOTE — Telephone Encounter (Signed)
Notified pt that labs will be ready

## 2012-12-09 ENCOUNTER — Other Ambulatory Visit: Payer: Self-pay | Admitting: Internal Medicine

## 2012-12-09 DIAGNOSIS — E079 Disorder of thyroid, unspecified: Secondary | ICD-10-CM | POA: Diagnosis not present

## 2012-12-09 DIAGNOSIS — R7309 Other abnormal glucose: Secondary | ICD-10-CM | POA: Diagnosis not present

## 2012-12-09 DIAGNOSIS — E669 Obesity, unspecified: Secondary | ICD-10-CM | POA: Diagnosis not present

## 2012-12-09 DIAGNOSIS — E876 Hypokalemia: Secondary | ICD-10-CM | POA: Diagnosis not present

## 2012-12-09 DIAGNOSIS — R748 Abnormal levels of other serum enzymes: Secondary | ICD-10-CM | POA: Diagnosis not present

## 2012-12-09 DIAGNOSIS — E785 Hyperlipidemia, unspecified: Secondary | ICD-10-CM | POA: Diagnosis not present

## 2012-12-09 DIAGNOSIS — Z800 Family history of malignant neoplasm of digestive organs: Secondary | ICD-10-CM | POA: Diagnosis not present

## 2012-12-09 LAB — COMPREHENSIVE METABOLIC PANEL
AST: 41 U/L — ABNORMAL HIGH (ref 0–37)
BUN: 19 mg/dL (ref 6–23)
Calcium: 9.6 mg/dL (ref 8.4–10.5)
Chloride: 101 mEq/L (ref 96–112)
Creat: 0.85 mg/dL (ref 0.50–1.10)
Total Bilirubin: 0.8 mg/dL (ref 0.3–1.2)

## 2012-12-09 LAB — LIPID PANEL
Cholesterol: 210 mg/dL — ABNORMAL HIGH (ref 0–200)
HDL: 62 mg/dL (ref >39)
Total CHOL/HDL Ratio: 3.4 Ratio

## 2012-12-09 LAB — CBC WITH DIFFERENTIAL/PLATELET
Basophils Relative: 1 % (ref 0–1)
Eosinophils Relative: 0 % (ref 0–5)
HCT: 41.1 % (ref 36.0–46.0)
Hemoglobin: 13.8 g/dL (ref 12.0–15.0)
Lymphocytes Relative: 17 % (ref 12–46)
MCHC: 33.6 g/dL (ref 30.0–36.0)
MCV: 88.2 fL (ref 78.0–100.0)
Monocytes Absolute: 0.6 10*3/uL (ref 0.1–1.0)
Monocytes Relative: 8 % (ref 3–12)
Neutro Abs: 6.2 10*3/uL (ref 1.7–7.7)

## 2012-12-10 ENCOUNTER — Telehealth: Payer: Self-pay | Admitting: *Deleted

## 2012-12-10 LAB — VITAMIN D 25 HYDROXY (VIT D DEFICIENCY, FRACTURES): Vit D, 25-Hydroxy: 62 ng/mL (ref 30–89)

## 2012-12-10 NOTE — Telephone Encounter (Signed)
A1C added to labs 

## 2012-12-11 ENCOUNTER — Telehealth: Payer: Self-pay | Admitting: *Deleted

## 2012-12-11 ENCOUNTER — Encounter: Payer: Self-pay | Admitting: *Deleted

## 2012-12-11 NOTE — Telephone Encounter (Signed)
Notified pt of elevated A1C

## 2012-12-11 NOTE — Telephone Encounter (Signed)
Message copied by Mathews Robinsons on Thu Dec 11, 2012  2:23 PM ------      Message from: Raechel Chute D      Created: Thu Dec 11, 2012 11:30 AM       Ok to mail to pt            Call pt and let her know that she is very close to diabetes levels with her sugar.  Advise only sugar free sodas and no fruit juices until I see her in October.              Have her keep October appt with me ------

## 2012-12-21 ENCOUNTER — Telehealth: Payer: Self-pay | Admitting: Internal Medicine

## 2012-12-21 NOTE — Telephone Encounter (Signed)
Allison [name]  Call pt and let her know that she is very close to diabetes levels with blood sugar.  Schedule a 30 min appt with me in office on Wednesday or next week if Wednesday doesn't work for pt

## 2012-12-22 NOTE — Telephone Encounter (Signed)
Pt has a CPE scheduled  The first of October

## 2013-01-08 ENCOUNTER — Other Ambulatory Visit: Payer: Self-pay | Admitting: *Deleted

## 2013-01-08 NOTE — Telephone Encounter (Signed)
Refill request

## 2013-01-13 ENCOUNTER — Telehealth: Payer: Self-pay | Admitting: *Deleted

## 2013-01-13 MED ORDER — POTASSIUM CHLORIDE ER 10 MEQ PO CPCR: ORAL_CAPSULE | ORAL | Status: DC

## 2013-01-13 MED ORDER — ATORVASTATIN CALCIUM 20 MG PO TABS: 20.0000 mg | ORAL_TABLET | Freq: Every day | ORAL | Status: DC

## 2013-01-13 MED ORDER — FUROSEMIDE 80 MG PO TABS: 80.0000 mg | ORAL_TABLET | Freq: Every day | ORAL | Status: DC

## 2013-01-13 MED ORDER — LEVOTHYROXINE SODIUM 125 MCG PO TABS: 125.0000 ug | ORAL_TABLET | Freq: Every day | ORAL | Status: DC

## 2013-01-13 NOTE — Telephone Encounter (Signed)
Allison [name]  I refilled medication  Call lynn and let her know that her liver function test is slightly elevated   Likely due to her lipitor  Advise her to be sure to see me for her CPE this month

## 2013-01-13 NOTE — Telephone Encounter (Signed)
Notified pt of liver function test and refill

## 2013-01-20 ENCOUNTER — Ambulatory Visit (HOSPITAL_BASED_OUTPATIENT_CLINIC_OR_DEPARTMENT_OTHER)
Admission: RE | Admit: 2013-01-20 | Discharge: 2013-01-20 | Disposition: A | Discharge status: Home / Self Care | Payer: Medicare Other | Source: Ambulatory Visit | Attending: Internal Medicine | Admitting: Internal Medicine

## 2013-01-20 ENCOUNTER — Other Ambulatory Visit (HOSPITAL_COMMUNITY)
Admission: RE | Admit: 2013-01-20 | Discharge: 2013-01-20 | Disposition: A | Discharge status: Home / Self Care | Payer: Medicare Other | Source: Ambulatory Visit | Attending: Internal Medicine | Admitting: Internal Medicine

## 2013-01-20 ENCOUNTER — Ambulatory Visit (INDEPENDENT_AMBULATORY_CARE_PROVIDER_SITE_OTHER): Payer: Medicare Other | Admitting: Internal Medicine

## 2013-01-20 ENCOUNTER — Encounter: Payer: Self-pay | Admitting: Internal Medicine

## 2013-01-20 ENCOUNTER — Telehealth: Payer: Self-pay | Admitting: *Deleted

## 2013-01-20 VITALS — BP 154/76 | HR 62 | Temp 97.6°F | Resp 18 | Wt 223.0 lb

## 2013-01-20 DIAGNOSIS — R109 Unspecified abdominal pain: Secondary | ICD-10-CM

## 2013-01-20 DIAGNOSIS — E079 Disorder of thyroid, unspecified: Secondary | ICD-10-CM

## 2013-01-20 DIAGNOSIS — K5730 Diverticulosis of large intestine without perforation or abscess without bleeding: Secondary | ICD-10-CM

## 2013-01-20 DIAGNOSIS — R0602 Shortness of breath: Secondary | ICD-10-CM | POA: Insufficient documentation

## 2013-01-20 DIAGNOSIS — E785 Hyperlipidemia, unspecified: Secondary | ICD-10-CM

## 2013-01-20 DIAGNOSIS — Z124 Encounter for screening for malignant neoplasm of cervix: Secondary | ICD-10-CM | POA: Insufficient documentation

## 2013-01-20 DIAGNOSIS — Z0000 Encounter for general adult medical examination without abnormal findings: Secondary | ICD-10-CM

## 2013-01-20 DIAGNOSIS — Z23 Encounter for immunization: Secondary | ICD-10-CM | POA: Diagnosis not present

## 2013-01-20 DIAGNOSIS — Z1151 Encounter for screening for human papillomavirus (HPV): Secondary | ICD-10-CM | POA: Insufficient documentation

## 2013-01-20 DIAGNOSIS — R0600 Dyspnea, unspecified: Secondary | ICD-10-CM | POA: Insufficient documentation

## 2013-01-20 DIAGNOSIS — K760 Fatty (change of) liver, not elsewhere classified: Secondary | ICD-10-CM

## 2013-01-20 DIAGNOSIS — E2839 Other primary ovarian failure: Secondary | ICD-10-CM

## 2013-01-20 DIAGNOSIS — M169 Osteoarthritis of hip, unspecified: Secondary | ICD-10-CM

## 2013-01-20 DIAGNOSIS — R7401 Elevation of levels of liver transaminase levels: Secondary | ICD-10-CM

## 2013-01-20 DIAGNOSIS — R7989 Other specified abnormal findings of blood chemistry: Secondary | ICD-10-CM

## 2013-01-20 DIAGNOSIS — K5790 Diverticulosis of intestine, part unspecified, without perforation or abscess without bleeding: Secondary | ICD-10-CM

## 2013-01-20 DIAGNOSIS — E669 Obesity, unspecified: Secondary | ICD-10-CM

## 2013-01-20 NOTE — Telephone Encounter (Signed)
Message copied by Mathews Robinsons on Tue Jan 20, 2013  2:19 PM ------      Message from: Raechel Chute D      Created: Tue Jan 20, 2013  1:56 PM       Call lynn and let her know that her CXR is normal ------

## 2013-01-20 NOTE — Progress Notes (Signed)
Subjective:    Patient ID: Allison [name], Allison [name]    DOB: 05/06/1945, 67 y.o.   MRN: [id number]  HPI Allison [name] is her for CPE  She will be leaving for a 3 week cruise on Saturday.   She is retired now from old [employer]  See BP  Allison [name] brings a handwritten copy of BP at home.  Highest sbp as outpt was 124  See scanned note  She does have DOE and some  PND.  No LE edema  No chest pain or pressure  She will need a Dexa this year  Hyperlipidemia  Tolerating statin without report of muscle symptoms  See chemistries  Elevated lfts may be result of statin,   She had normal liver on CT of abd in 2007.  No report of hepatitis exposure Slight elevation of glucose  No Known Allergies Past Medical History  Diagnosis Date  . Diverticulosis   . Hyperlipidemia   . Thyroid disease     hypo  . Elevated serum alkaline phosphatase level   . Fatty liver disease, nonalcoholic   . Obesity   . DJD (degenerative joint disease) of hip   . DJD (degenerative joint disease) of hip     s/P R hip replacement   Past Surgical History  Procedure Laterality Date  . Rotator cuff repair  1996, 1998, 2008  . Joint replacement  2004    R hip  . Toe fusion  2000's   History   Social History  . Marital Status: Married    Spouse Name: N/A    Number of Children: N/A  . Years of Education: N/A   Occupational History  . Not on file.   Social History Main Topics  . Smoking status: Former Smoker -- 2.00 packs/day for 20 years    Types: Cigarettes    Quit date: 04/03/1983  . Smokeless tobacco: Never Used  . Alcohol Use: 3.5 oz/week    7 drink(s) per week  . Drug Use: No  . Sexual Activity: No   Other Topics Concern  . Not on file   Social History Narrative  . No narrative on file   Family History  Problem Relation Age of Onset  . Cancer Mother 53    colon  . Heart disease Father   . HIV Brother   . Hearing loss Daughter    Patient Active Problem List   Diagnosis Date Noted  . Dyspnea 01/20/2013   . Bronchospasm, acute 11/22/2011  . Urticaria 11/22/2011  . Family history of colon cancer 09/09/2011  . Obesity 09/09/2011  . Hypokalemia 06/18/2011  . Hyperlipidemia   . Thyroid disease   . Diverticulosis   . Elevated serum alkaline phosphatase level   . Fatty liver disease, nonalcoholic   . DJD (degenerative joint disease) of hip    Current Outpatient Prescriptions on File Prior to Visit  Medication Sig Dispense Refill  . albuterol (PROVENTIL HFA;VENTOLIN HFA) 108 (90 BASE) MCG/ACT inhaler Take 2 inhalations tid as needed  1 Inhaler  1  . atorvastatin (LIPITOR) 20 MG tablet Take 1 tablet (20 mg total) by mouth daily.  90 tablet  0  . Calcium Carbonate-Vit D-Min (CALCIUM 1200 PO) Take 1 tablet by mouth daily.        . Cholecalciferol (VITAMIN D3) 5000 UNITS TABS Take 1 tablet by mouth daily.        . furosemide (LASIX) 80 MG tablet Take 1 tablet (80 mg total) by mouth daily.  90 tablet  0  . Ibuprofen 200 MG CAPS Take 3 capsules (600 mg total) by mouth every 6 (six) hours as needed.  120 each  0  . levothyroxine (SYNTHROID, LEVOTHROID) 125 MCG tablet Take 1 tablet (125 mcg total) by mouth daily.  90 tablet  3  . potassium chloride (MICRO-K) 10 MEQ CR capsule Take 2 tablets daily  180 capsule  0  . RABEprazole (ACIPHEX) 20 MG tablet Take 1 tablet (20 mg total) by mouth daily.  90 tablet  3   No current facility-administered medications on file prior to visit.       Review of Systems See HPI    Objective:   Physical Exam Physical Exam  Vital signs and nursing note reviewed  Constitutional: She is oriented to person, place, and time. She appears well-developed and well-nourished. She is cooperative.  HENT:  Head: Normocephalic and atraumatic.  Right Ear: Tympanic membrane normal.  Left Ear: Tympanic membrane normal.  Nose: Nose normal.  Mouth/Throat: Oropharynx is clear and moist and mucous membranes are normal. No oropharyngeal exudate or posterior oropharyngeal  erythema.  Eyes: Conjunctivae and EOM are normal. Pupils are equal, round, and reactive to light.  Neck: Neck supple. No JVD present. Carotid bruit is not present. No mass and no thyromegaly present.  Cardiovascular: Regular rhythm, normal heart sounds, intact distal pulses and normal pulses.  Exam reveals no gallop and no friction rub.   No murmur heard. Pulses:      Dorsalis pedis pulses are 2+ on the right side, and 2+ on the left side.  Pulmonary/Chest: Breath sounds normal. She has no wheezes. She has no rhonchi. She has no rales. Right breast exhibits no mass, no nipple discharge and no skin change. Left breast exhibits no mass, no nipple discharge and no skin change.  Abdominal: Soft. Bowel sounds are normal. She exhibits no distension and no mass. There is no hepatosplenomegaly. There is no tenderness. There is no CVA tenderness.  Genitourinary: Rectum normal, vagina normal and uterus normal. Rectal exam shows no mass. Guaiac negative stool. No labial fusion. There is no lesion on the right labia. There is no lesion on the left labia. Cervix exhibits no motion tenderness. Right adnexum displays no mass, no tenderness and no fullness. Left adnexum displays no mass, no tenderness and no fullness. No erythema around the vagina.  Musculoskeletal:       No active synovitis to any joint.    Lymphadenopathy:       Right cervical: No superficial cervical adenopathy present.      Left cervical: No superficial cervical adenopathy present.       Right axillary: No pectoral and no lateral adenopathy present.       Left axillary: No pectoral and no lateral adenopathy present.      Right: No inguinal adenopathy present.       Left: No inguinal adenopathy present.  Neurological: She is alert and oriented to person, place, and time. She has normal strength and normal reflexes. No cranial nerve deficit or sensory deficit. She displays a negative Romberg sign. Coordination and gait normal.  Skin: Skin is  warm and dry. No abrasion, no bruising, no ecchymosis and no rash noted. No cyanosis. Nails show no clubbing.  Shehas a darkened nevus on her back  Smooth borders no variegated coloration Psychiatric: She has a normal mood and affect. Her speech is normal and behavior is normal.          Assessment & Plan:  Health  Maintenance  Pap today will schedule Dexa  Influenza today.  UTD with pneumonia  Pt to check aboutTdap UTD with mm  ADvised to get 3D next year  Elevated BP  Advised to record Bp's at a pharmacy and bring to her next visit.  There may be an element of White coat syndrome  Her SBP did come down almost 30 points on recheck  Dyspnea  EKG today  Sinus bradycardia no change from prior,  Will get CXR today and schedule 2D echo.  She wishes to wait for echo until her return form cruise  Hyperlipidemia  Continue statin  elevarted lfts  May be combined  Steatosis and  med effect from statin  but will check liver U/S and labs.  Pt wishes to do this after her return from cruise  Darkened nevus on back  She sees Dr. Stefanie Libel.  ADvised to make appt for eval with dermatologist and pt voices understanding  FH colon CA in mother  She is due for colonoscopy 2015  She is to see me in November or sooner prn if SOB worsens         Assessment & Plan:

## 2013-01-20 NOTE — Patient Instructions (Signed)
Will schedule bone density  ( [facility])  ,  abd ultrasound,  And echocardiogram  To be done upon your return  GEt CXR tday  See me in late November  30 min  You received a flu vaccine today

## 2013-01-20 NOTE — Telephone Encounter (Signed)
Pt notified via VM that chest x ray is normal

## 2013-01-22 ENCOUNTER — Telehealth: Payer: Self-pay | Admitting: *Deleted

## 2013-01-22 NOTE — Telephone Encounter (Signed)
Message copied by Mathews Robinsons on Thu Jan 22, 2013  9:21 AM ------      Message from: Raechel Chute D      Created: Tue Jan 20, 2013  1:56 PM       Call lynn and let her know that her CXR is normal ------

## 2013-01-22 NOTE — Telephone Encounter (Signed)
Notified pt of x ray results  

## 2013-01-22 NOTE — Telephone Encounter (Signed)
Message copied by Mathews Robinsons on Thu Jan 22, 2013  9:02 AM ------      Message from: Raechel Chute D      Created: Tue Jan 20, 2013  1:56 PM       Call lynn and let her know that her CXR is normal ------

## 2013-01-26 ENCOUNTER — Telehealth: Payer: Self-pay | Admitting: *Deleted

## 2013-01-26 NOTE — Telephone Encounter (Signed)
-   pap letter sent to pt home address

## 2013-01-29 ENCOUNTER — Encounter: Payer: Self-pay | Admitting: *Deleted

## 2013-02-09 ENCOUNTER — Ambulatory Visit (HOSPITAL_COMMUNITY)
Admission: RE | Admit: 2013-02-09 | Discharge: 2013-02-09 | Disposition: A | Discharge status: Home / Self Care | Payer: Medicare Other | Source: Ambulatory Visit | Attending: Internal Medicine | Admitting: Internal Medicine

## 2013-02-09 ENCOUNTER — Telehealth: Payer: Self-pay | Admitting: *Deleted

## 2013-02-09 DIAGNOSIS — I519 Heart disease, unspecified: Secondary | ICD-10-CM | POA: Diagnosis not present

## 2013-02-09 DIAGNOSIS — R0989 Other specified symptoms and signs involving the circulatory and respiratory systems: Secondary | ICD-10-CM | POA: Insufficient documentation

## 2013-02-09 DIAGNOSIS — R0600 Dyspnea, unspecified: Secondary | ICD-10-CM

## 2013-02-09 DIAGNOSIS — I517 Cardiomegaly: Secondary | ICD-10-CM | POA: Insufficient documentation

## 2013-02-09 DIAGNOSIS — R0609 Other forms of dyspnea: Secondary | ICD-10-CM | POA: Diagnosis not present

## 2013-02-09 DIAGNOSIS — E2839 Other primary ovarian failure: Secondary | ICD-10-CM

## 2013-02-09 DIAGNOSIS — E669 Obesity, unspecified: Secondary | ICD-10-CM | POA: Insufficient documentation

## 2013-02-09 MED ORDER — ATORVASTATIN CALCIUM 20 MG PO TABS: 20.0000 mg | ORAL_TABLET | Freq: Every day | ORAL | Status: DC

## 2013-02-09 MED ORDER — POTASSIUM CHLORIDE ER 10 MEQ PO CPCR: ORAL_CAPSULE | ORAL | Status: DC

## 2013-02-09 MED ORDER — FUROSEMIDE 80 MG PO TABS: 80.0000 mg | ORAL_TABLET | Freq: Every day | ORAL | Status: DC

## 2013-02-09 MED ORDER — LEVOTHYROXINE SODIUM 125 MCG PO TABS: 125.0000 ug | ORAL_TABLET | Freq: Every day | ORAL | Status: DC

## 2013-02-09 NOTE — Telephone Encounter (Signed)
Allison [name] called and said that [facility] has not received her 4 Rx's.  She got the # to the pharmacy [phone]; so you can call them in.  She also needs a 30 day supply called into [facility] [address] because she is close to running out of medications.  Please call her and update her once you have called [facility].

## 2013-02-09 NOTE — Progress Notes (Signed)
Echocardiogram 2D Echocardiogram has been performed.  Allison [name] 02/09/2013, 11:48 AM

## 2013-02-10 ENCOUNTER — Other Ambulatory Visit: Payer: Self-pay | Admitting: *Deleted

## 2013-02-18 ENCOUNTER — Ambulatory Visit
Admission: RE | Admit: 2013-02-18 | Discharge: 2013-02-18 | Disposition: A | Discharge status: Home / Self Care | Payer: Medicare Other | Source: Ambulatory Visit | Attending: Internal Medicine | Admitting: Internal Medicine

## 2013-02-18 DIAGNOSIS — E2839 Other primary ovarian failure: Secondary | ICD-10-CM

## 2013-02-18 DIAGNOSIS — M899 Disorder of bone, unspecified: Secondary | ICD-10-CM | POA: Diagnosis not present

## 2013-02-19 ENCOUNTER — Other Ambulatory Visit: Payer: Self-pay | Admitting: *Deleted

## 2013-02-19 ENCOUNTER — Ambulatory Visit (HOSPITAL_BASED_OUTPATIENT_CLINIC_OR_DEPARTMENT_OTHER)
Admission: RE | Admit: 2013-02-19 | Discharge: 2013-02-19 | Disposition: A | Discharge status: Home / Self Care | Payer: Medicare Other | Source: Ambulatory Visit | Attending: Internal Medicine | Admitting: Internal Medicine

## 2013-02-19 ENCOUNTER — Other Ambulatory Visit: Payer: Self-pay | Admitting: Internal Medicine

## 2013-02-19 DIAGNOSIS — R945 Abnormal results of liver function studies: Secondary | ICD-10-CM | POA: Diagnosis not present

## 2013-02-19 DIAGNOSIS — R109 Unspecified abdominal pain: Secondary | ICD-10-CM | POA: Diagnosis not present

## 2013-02-19 DIAGNOSIS — K7689 Other specified diseases of liver: Secondary | ICD-10-CM | POA: Diagnosis not present

## 2013-02-19 DIAGNOSIS — Z79899 Other long term (current) drug therapy: Secondary | ICD-10-CM | POA: Diagnosis not present

## 2013-02-19 DIAGNOSIS — R7989 Other specified abnormal findings of blood chemistry: Secondary | ICD-10-CM | POA: Diagnosis not present

## 2013-02-19 DIAGNOSIS — Z5181 Encounter for therapeutic drug level monitoring: Secondary | ICD-10-CM | POA: Diagnosis not present

## 2013-02-19 LAB — COMPREHENSIVE METABOLIC PANEL
BUN: 16 mg/dL (ref 6–23)
CO2: 29 mEq/L (ref 19–32)
Calcium: 9.6 mg/dL (ref 8.4–10.5)
Chloride: 102 mEq/L (ref 96–112)
Creat: 0.85 mg/dL (ref 0.50–1.10)

## 2013-02-19 NOTE — Telephone Encounter (Signed)
Orders verified with lab

## 2013-02-20 LAB — ANA: Anti Nuclear Antibody(ANA): NEGATIVE

## 2013-02-20 LAB — HEPATITIS PANEL, ACUTE
HCV Ab: NEGATIVE
Hep B C IgM: NONREACTIVE
Hepatitis B Surface Ag: NEGATIVE

## 2013-02-24 ENCOUNTER — Telehealth: Payer: Self-pay | Admitting: *Deleted

## 2013-02-24 NOTE — Telephone Encounter (Signed)
Called [facility] will need to be drawn lab does not have a lavender tube

## 2013-02-24 NOTE — Telephone Encounter (Signed)
Message copied by Mathews Robinsons on Tue Feb 24, 2013  1:44 PM ------      Message from: Raechel Chute D      Created: Sun Feb 22, 2013  5:50 PM       Karen Kitchens            Add a HGB AIC to these labs ------

## 2013-03-04 ENCOUNTER — Ambulatory Visit (INDEPENDENT_AMBULATORY_CARE_PROVIDER_SITE_OTHER): Payer: Medicare Other | Admitting: Internal Medicine

## 2013-03-04 ENCOUNTER — Encounter: Payer: Self-pay | Admitting: Physician Assistant

## 2013-03-04 ENCOUNTER — Encounter: Payer: Self-pay | Admitting: Internal Medicine

## 2013-03-04 VITALS — BP 172/75 | HR 60 | Temp 99.3°F | Resp 18 | Wt 223.0 lb

## 2013-03-04 DIAGNOSIS — K7689 Other specified diseases of liver: Secondary | ICD-10-CM | POA: Diagnosis not present

## 2013-03-04 DIAGNOSIS — R7309 Other abnormal glucose: Secondary | ICD-10-CM | POA: Diagnosis not present

## 2013-03-04 DIAGNOSIS — R932 Abnormal findings on diagnostic imaging of liver and biliary tract: Secondary | ICD-10-CM

## 2013-03-04 DIAGNOSIS — I10 Essential (primary) hypertension: Secondary | ICD-10-CM

## 2013-03-04 DIAGNOSIS — R748 Abnormal levels of other serum enzymes: Secondary | ICD-10-CM | POA: Diagnosis not present

## 2013-03-04 DIAGNOSIS — R7402 Elevation of levels of lactic acid dehydrogenase (LDH): Secondary | ICD-10-CM

## 2013-03-04 DIAGNOSIS — R739 Hyperglycemia, unspecified: Secondary | ICD-10-CM

## 2013-03-04 DIAGNOSIS — M899 Disorder of bone, unspecified: Secondary | ICD-10-CM

## 2013-03-04 DIAGNOSIS — K760 Fatty (change of) liver, not elsewhere classified: Secondary | ICD-10-CM

## 2013-03-04 DIAGNOSIS — E669 Obesity, unspecified: Secondary | ICD-10-CM

## 2013-03-04 DIAGNOSIS — M8580 Other specified disorders of bone density and structure, unspecified site: Secondary | ICD-10-CM | POA: Insufficient documentation

## 2013-03-04 HISTORY — DX: Essential (primary) hypertension: I10

## 2013-03-04 NOTE — Progress Notes (Signed)
Subjective:    Patient ID: Allison [name], female    DOB: 1945/08/10, 67 y.o.   MRN: [id number]  HPI  Allison [name] is here for follow up of several issues .  She is here with her partner [place].  They have just returned from a 3 week cruise .  She has mild elevation of LFt's and new elevation of alkaline phosphatase that is likely multifactorial  Statin,  heaptic steatosis .  See ultrasound  Report  .  Some question of coarse texture of parenchyma.   Dexa shows osteopenia   Dyspnea  :  Echo shows adequate  Resting EF of 55-60%  Dyspnea somewhat better and occurs on exertion only.  CXR  Unrevealing for cause of Dyspnea.     Dyspnea Worsened wilth her weight gain.    No chest pain or pressure  Allison [name] has been checking her BP at a Insurance claims handler.  She states SBP ranges  120's to 140 .  Highest 142.  She is on daily Lasix 80 mg  She states she will call dermatologist.  No Known Allergies Past Medical History  Diagnosis Date  . Diverticulosis   . Hyperlipidemia   . Thyroid disease     hypo  . Elevated serum alkaline phosphatase level   . Fatty liver disease, nonalcoholic   . Obesity   . DJD (degenerative joint disease) of hip   . DJD (degenerative joint disease) of hip     s/P R hip replacement   Past Surgical History  Procedure Laterality Date  . Rotator cuff repair  1996, 1998, 2008  . Joint replacement  2004    R hip  . Toe fusion  2000's  . Dental surgery     History   Social History  . Marital Status: Married    Spouse Name: N/A    Number of Children: N/A  . Years of Education: N/A   Occupational History  . Not on file.   Social History Main Topics  . Smoking status: Former Smoker -- 2.00 packs/day for 20 years    Types: Cigarettes    Quit date: 04/03/1983  . Smokeless tobacco: Never Used  . Alcohol Use: 3.5 oz/week    7 drink(s) per week  . Drug Use: No  . Sexual Activity: No   Other Topics Concern  . Not on file   Social History Narrative  . No narrative  on file   Family History  Problem Relation Age of Onset  . Cancer Mother 15    colon  . Heart disease Father   . HIV Brother   . Hearing loss Daughter    Patient Active Problem List   Diagnosis Date Noted  . Dyspnea 01/20/2013  . Bronchospasm, acute 11/22/2011  . Urticaria 11/22/2011  . Family history of colon cancer 09/09/2011  . Obesity 09/09/2011  . Hypokalemia 06/18/2011  . Hyperlipidemia   . Thyroid disease   . Diverticulosis   . Elevated serum alkaline phosphatase level   . Fatty liver disease, nonalcoholic   . DJD (degenerative joint disease) of hip    Current Outpatient Prescriptions on File Prior to Visit  Medication Sig Dispense Refill  . albuterol (PROVENTIL HFA;VENTOLIN HFA) 108 (90 BASE) MCG/ACT inhaler Take 2 inhalations tid as needed  1 Inhaler  1  . atorvastatin (LIPITOR) 20 MG tablet Take 1 tablet (20 mg total) by mouth daily.  30 tablet  0  . Cholecalciferol (VITAMIN D3) 5000 UNITS TABS Take 2,000 Units by  mouth daily.       . furosemide (LASIX) 80 MG tablet Take 1 tablet (80 mg total) by mouth daily.  30 tablet  0  . Ibuprofen 200 MG CAPS Take 3 capsules (600 mg total) by mouth every 6 (six) hours as needed.  120 each  0  . levothyroxine (SYNTHROID, LEVOTHROID) 125 MCG tablet Take 1 tablet (125 mcg total) by mouth daily.  30 tablet  0  . potassium chloride (MICRO-K) 10 MEQ CR capsule Take 2 tablets daily  60 capsule  0  . RABEprazole (ACIPHEX) 20 MG tablet Take 1 tablet (20 mg total) by mouth daily.  90 tablet  3  . Calcium Carbonate-Vit D-Min (CALCIUM 1200 PO) Take 1 tablet by mouth daily.         No current facility-administered medications on file prior to visit.      Review of Systems See HPI    Objective:   Physical Exam Physical Exam  Nursing note and vitals reviewed.   Repeat BP  156/88 Constitutional: She is oriented to person, place, and time. She appears well-developed and well-nourished.  HENT:  Head: Normocephalic and atraumatic.    Cardiovascular: Normal rate and regular rhythm. Exam reveals no gallop and no friction rub.  No murmur heard.  Pulmonary/Chest: Breath sounds normal. She has no wheezes. She has no rales. Abd:  Obese   BS pos  No HSM  Neurological: She is alert and oriented to person, place, and time.  Skin: Skin is warm and dry.  Psychiatric: She has a normal mood and affect. Her behavior is normal.        Assessment & Plan:  Elevated lfts and question of abnormal liver texture.  With new elevation of alk phos and coarse parenchymal texture will get GI opinion.    She has know hepatic steatosis and is on statin.  She has seen Dr. Marina Goodell in the past  Dyspnea  Likely due to obesity  Will moniter for now  Elevated glucose will check AIC today.   Discusssed weight loss with pt.  Further management based on AIC results  Osteopenia    HTN :  I do think she has an element of white coat HTN  Will moniter.  She is to purchase an arm cuff and bring outpt readings to me    See me in 3 months

## 2013-03-04 NOTE — Patient Instructions (Addendum)
Set up appt with Dr. Marina Goodell  (pt has seen in past )  For abnormal liver ultrasound  See me in 3 months  30 mins  Check BP with arm cuff and bring recordings next viist

## 2013-03-05 ENCOUNTER — Telehealth: Payer: Self-pay | Admitting: Internal Medicine

## 2013-03-05 NOTE — Telephone Encounter (Signed)
Spoke with pt and informed of AIC   She will see me next week to start on anti-diabetic agent

## 2013-03-06 ENCOUNTER — Ambulatory Visit: Payer: Medicare Other | Admitting: Gastroenterology

## 2013-03-10 ENCOUNTER — Telehealth: Payer: Self-pay | Admitting: *Deleted

## 2013-03-10 ENCOUNTER — Ambulatory Visit: Payer: Medicare Other | Admitting: Physician Assistant

## 2013-03-11 ENCOUNTER — Ambulatory Visit: Payer: Medicare Other | Admitting: Internal Medicine

## 2013-03-12 ENCOUNTER — Encounter: Payer: Self-pay | Admitting: Internal Medicine

## 2013-03-12 DIAGNOSIS — Z9289 Personal history of other medical treatment: Secondary | ICD-10-CM | POA: Insufficient documentation

## 2013-03-17 ENCOUNTER — Ambulatory Visit (INDEPENDENT_AMBULATORY_CARE_PROVIDER_SITE_OTHER): Payer: Medicare Other | Admitting: Internal Medicine

## 2013-03-17 VITALS — BP 129/66 | HR 61 | Temp 97.6°F | Wt 220.8 lb

## 2013-03-17 DIAGNOSIS — K760 Fatty (change of) liver, not elsewhere classified: Secondary | ICD-10-CM

## 2013-03-17 DIAGNOSIS — E669 Obesity, unspecified: Secondary | ICD-10-CM | POA: Diagnosis not present

## 2013-03-17 DIAGNOSIS — K7689 Other specified diseases of liver: Secondary | ICD-10-CM | POA: Diagnosis not present

## 2013-03-17 DIAGNOSIS — E119 Type 2 diabetes mellitus without complications: Secondary | ICD-10-CM | POA: Diagnosis not present

## 2013-03-17 DIAGNOSIS — I10 Essential (primary) hypertension: Secondary | ICD-10-CM

## 2013-03-17 NOTE — Progress Notes (Signed)
Subjective:    Patient ID: Allison [name], female    DOB: 1946/03/15, 67 y.o.   MRN: [id number]  HPI Allison [name] is here with her partner [place].    See labs  I advised that she needs to go on medication for new onset diabetes  (certainly complicated by her obesity) but Allison [name] repeatedly declines this now and would like to try to exercise and change her diet.  She is retired now and I note she has lost 3 lbs in the last two weeks  AIC is 6.6 1 week ago  See lfts  She has known steatohepatitis.    Allison [name] brings a copy of her outpt Blood pressure.  SBP in 120's and 130's at home.  Normal BP today in office  129/66  No Known Allergies Past Medical History  Diagnosis Date  . Diverticulosis   . Hyperlipidemia   . Thyroid disease     hypo  . Elevated serum alkaline phosphatase level   . Fatty liver disease, nonalcoholic   . Obesity   . DJD (degenerative joint disease) of hip   . DJD (degenerative joint disease) of hip     s/P R hip replacement   Past Surgical History  Procedure Laterality Date  . Rotator cuff repair  1996, 1998, 2008  . Joint replacement  2004    R hip  . Toe fusion  2000's  . Dental surgery     History   Social History  . Marital Status: Married    Spouse Name: N/A    Number of Children: N/A  . Years of Education: N/A   Occupational History  . Not on file.   Social History Main Topics  . Smoking status: Former Smoker -- 2.00 packs/day for 20 years    Types: Cigarettes    Quit date: 04/03/1983  . Smokeless tobacco: Never Used  . Alcohol Use: 3.5 oz/week    7 drink(s) per week  . Drug Use: No  . Sexual Activity: No   Other Topics Concern  . Not on file   Social History Narrative  . No narrative on file   Family History  Problem Relation Age of Onset  . Cancer Mother 83    colon  . Heart disease Father   . HIV Brother   . Hearing loss Daughter    Patient Active Problem List   Diagnosis Date Noted  . Type II or unspecified type diabetes  mellitus without mention of complication, not stated as uncontrolled 03/17/2013  . Hx of bone density study 03/12/2013  . Elevated transaminase measurement 03/04/2013  . HTN (hypertension) 03/04/2013  . Osteopenia 03/04/2013  . Dyspnea 01/20/2013  . Bronchospasm, acute 11/22/2011  . Urticaria 11/22/2011  . Family history of colon cancer 09/09/2011  . Obesity 09/09/2011  . Hypokalemia 06/18/2011  . Hyperlipidemia   . Thyroid disease   . Diverticulosis   . Elevated serum alkaline phosphatase level   . Fatty liver disease, nonalcoholic   . DJD (degenerative joint disease) of hip    Current Outpatient Prescriptions on File Prior to Visit  Medication Sig Dispense Refill  . albuterol (PROVENTIL HFA;VENTOLIN HFA) 108 (90 BASE) MCG/ACT inhaler Take 2 inhalations tid as needed  1 Inhaler  1  . atorvastatin (LIPITOR) 20 MG tablet Take 1 tablet (20 mg total) by mouth daily.  30 tablet  0  . Calcium Carbonate-Vit D-Min (CALCIUM 1200 PO) Take 1 tablet by mouth daily.        Marland Kitchen  Cholecalciferol (VITAMIN D3) 5000 UNITS TABS Take 2,000 Units by mouth daily.       . furosemide (LASIX) 80 MG tablet Take 1 tablet (80 mg total) by mouth daily.  30 tablet  0  . Ibuprofen 200 MG CAPS Take 3 capsules (600 mg total) by mouth every 6 (six) hours as needed.  120 each  0  . levothyroxine (SYNTHROID, LEVOTHROID) 125 MCG tablet Take 1 tablet (125 mcg total) by mouth daily.  30 tablet  0  . potassium chloride (MICRO-K) 10 MEQ CR capsule Take 2 tablets daily  60 capsule  0  . RABEprazole (ACIPHEX) 20 MG tablet Take 1 tablet (20 mg total) by mouth daily.  90 tablet  3   No current facility-administered medications on file prior to visit.       Review of Systems See HPI    Objective:   Physical Exam Physical Exam  Nursing note and vitals reviewed.  Constitutional: She is oriented to person, place, and time. She appears well-developed and well-nourished.  HENT:  Head: Normocephalic and atraumatic.    Cardiovascular: Normal rate and regular rhythm. Exam reveals no gallop and no friction rub.  No murmur heard.  Pulmonary/Chest: Breath sounds normal. She has no wheezes. She has no rales.  Neurological: She is alert and oriented to person, place, and time.  Skin: Skin is warm and dry.  Psychiatric: She has a normal mood and affect. Her behavior is normal.        Assessment & Plan:  New onset diabetes:  I advised medication but pt unwilling to start now.  She states she will join silver sneakers and start walking and change diet to lose weight.  I advised goal to lose at least 15 lbs until next visit.  Will refer to diabetes center for education with a focus on diet  Obesity  See above she did lose 3 lbs since last visit.    HTN  She is on Lasix 80 mg with her K.  Outpatient bp's accepatable no changes now.

## 2013-03-17 NOTE — Patient Instructions (Signed)
Referral for diabetes education  New onset    See me in March

## 2013-03-30 NOTE — Telephone Encounter (Signed)
error 

## 2013-04-07 ENCOUNTER — Ambulatory Visit (INDEPENDENT_AMBULATORY_CARE_PROVIDER_SITE_OTHER): Payer: Medicare Other | Admitting: Internal Medicine

## 2013-04-07 ENCOUNTER — Other Ambulatory Visit (INDEPENDENT_AMBULATORY_CARE_PROVIDER_SITE_OTHER): Payer: Medicare Other

## 2013-04-07 ENCOUNTER — Encounter: Payer: Self-pay | Admitting: Internal Medicine

## 2013-04-07 VITALS — BP 140/78 | HR 72 | Ht 64.0 in | Wt 219.0 lb

## 2013-04-07 DIAGNOSIS — K760 Fatty (change of) liver, not elsewhere classified: Secondary | ICD-10-CM

## 2013-04-07 DIAGNOSIS — R740 Nonspecific elevation of levels of transaminase and lactic acid dehydrogenase [LDH]: Principal | ICD-10-CM

## 2013-04-07 DIAGNOSIS — K7689 Other specified diseases of liver: Secondary | ICD-10-CM | POA: Diagnosis not present

## 2013-04-07 DIAGNOSIS — R7401 Elevation of levels of liver transaminase levels: Secondary | ICD-10-CM | POA: Diagnosis not present

## 2013-04-07 DIAGNOSIS — R7402 Elevation of levels of lactic acid dehydrogenase (LDH): Secondary | ICD-10-CM | POA: Diagnosis not present

## 2013-04-07 DIAGNOSIS — R932 Abnormal findings on diagnostic imaging of liver and biliary tract: Secondary | ICD-10-CM | POA: Diagnosis not present

## 2013-04-07 LAB — IRON: Iron: 54 ug/dL (ref 42–145)

## 2013-04-07 LAB — IBC PANEL
IRON: 54 ug/dL (ref 42–145)
SATURATION RATIOS: 13.7 % — AB (ref 20.0–50.0)
TRANSFERRIN: 281.2 mg/dL (ref 212.0–360.0)

## 2013-04-07 LAB — PROTIME-INR
INR: 1.1 ratio — AB (ref 0.8–1.0)
Prothrombin Time: 11.1 s (ref 10.2–12.4)

## 2013-04-07 LAB — FERRITIN: FERRITIN: 52.1 ng/mL (ref 10.0–291.0)

## 2013-04-07 NOTE — Progress Notes (Signed)
HISTORY OF PRESENT ILLNESS:  Allison [name] is a 68 y.o. female with multiple medical problems as listed below. She is referred today by her primary provider regarding newly identified abnormal liver tests and abnormal liver imaging. The patient has been seen previously two occasions for screening colonoscopy in March of 2005 and November of 2011. Examinations revealed severe left-sided diverticulosis and internal hemorrhoids. No polyps. Followup in 5 years recommended secondary to a family history of colon cancer in her mother at age 30. The patient is accompanied today by Marshia Ly. Reviewing the outside records, her liver tests historically have been normal. Last normal liver tests one year ago. Mildly elevated hepatic transaminases and alkaline phosphatase (a few points above normal) 3 months ago and one month ago. Abdominal ultrasound (reviewed) 02/19/2013 revealed normal liver size with echogenic characteristics of fatty liver. No splenomegaly. The patient denies family or personal history of liver disease. No prior transfusion from others. No new medications. She generally has 0-2 blocker tonics per day. Regular use of BC powders for headaches and occasional ibuprofen. She has been chronically and significantly obese. No real change in recent years. Additional outside blood work shows that her albumin and protein levels are normal. Normal thyroid testing, ANA, and hepatitis serologies. Her GI review of systems is otherwise negative  REVIEW OF SYSTEMS:  All non-GI ROS negative except for joint aches and headaches  Past Medical History  Diagnosis Date  . Diverticulosis   . Hyperlipidemia   . Thyroid disease     hypo  . Elevated serum alkaline phosphatase level   . Fatty liver disease, nonalcoholic   . Obesity   . DJD (degenerative joint disease) of hip   . DJD (degenerative joint disease) of hip     s/P R hip replacement  . Internal hemorrhoids     Past Surgical History  Procedure  Laterality Date  . Rotator cuff repair  1996, 1998, 2008  . Joint replacement  2004    R hip  . Toe fusion  2000's  . Dental surgery      Social History SORIYA WORSTER  reports that she quit smoking about 30 years ago. Her smoking use included Cigarettes. She has a 40 pack-year smoking history. She has never used smokeless tobacco. She reports that she drinks about 3.5 ounces of alcohol per week. She reports that she does not use illicit drugs.  family history includes Cancer (age of onset: 31) in her mother; HIV in her brother; Heart disease in her father.  No Known Allergies     PHYSICAL EXAMINATION: Vital signs: BP 140/78  Pulse 72  Ht 5\' 4"  (1.626 m)  Wt 219 lb (99.338 kg)  BMI 37.57 kg/m2  Constitutional:  Obese,generally well-appearing, no acute distress Psychiatric: alert and oriented x3, cooperative Eyes: extraocular movements intact, anicteric, conjunctiva pink Mouth: oral pharynx moist, no lesions Neck: supple no lymphadenopathy Cardiovascular: heart regular rate and rhythm, no murmur Lungs: clear to auscultation bilaterally Abdomen: soft, obese, nontender, nondistended, no obvious ascites, no peritoneal signs, normal bowel sounds, no organomegaly Extremities: no lower extremity edema bilaterally Skin: no lesions on visible extremities Neuro: No focal deficits. No asterixis.    ASSESSMENT:  #1. Mild elevation of hepatic transaminases without evidence for clinically decompensated liver disease or portal hypertension. Most likely fatty liver disease secondary to her body habitus. Other possible contributors include chronic use of alcohol and NSAIDs. #2. Abnormal abdominal ultrasound revealing changes of fatty liver #3. Family history of colon cancer.  Last negative colonoscopy 2011 #4. General medical problems   PLAN:  #1. Discussion today on fatty liver disease. Both benign fatty liver disease and NASH with possible progression to cirrhosis with its  complications #2. Regular exercise #3. Weight loss #4. Reduction of alcohol and NSAIDs #5. Supplemental laboratories including ferritin, iron studies, and PT/INR #6. If the above workup negative, then exercise, weight loss, and recheck liver tests in 6 months.

## 2013-04-07 NOTE — Patient Instructions (Signed)
Your physician has requested that you go to the basement for lab work before leaving today  Please follow up with Dr. Henrene Pastor as needed

## 2013-04-15 ENCOUNTER — Other Ambulatory Visit: Payer: Self-pay | Admitting: Internal Medicine

## 2013-04-16 NOTE — Telephone Encounter (Signed)
Refill request

## 2013-04-21 DIAGNOSIS — M25559 Pain in unspecified hip: Secondary | ICD-10-CM | POA: Diagnosis not present

## 2013-04-29 DIAGNOSIS — L821 Other seborrheic keratosis: Secondary | ICD-10-CM | POA: Diagnosis not present

## 2013-04-29 DIAGNOSIS — D485 Neoplasm of uncertain behavior of skin: Secondary | ICD-10-CM | POA: Diagnosis not present

## 2013-06-02 ENCOUNTER — Ambulatory Visit (INDEPENDENT_AMBULATORY_CARE_PROVIDER_SITE_OTHER): Payer: Medicare Other | Admitting: Internal Medicine

## 2013-06-02 ENCOUNTER — Encounter: Payer: Self-pay | Admitting: Internal Medicine

## 2013-06-02 ENCOUNTER — Ambulatory Visit (HOSPITAL_BASED_OUTPATIENT_CLINIC_OR_DEPARTMENT_OTHER)
Admission: RE | Admit: 2013-06-02 | Discharge: 2013-06-02 | Disposition: A | Discharge status: Home / Self Care | Payer: Medicare Other | Source: Ambulatory Visit | Attending: Internal Medicine | Admitting: Internal Medicine

## 2013-06-02 VITALS — BP 152/67 | HR 62 | Temp 97.8°F | Resp 16 | Wt 205.0 lb

## 2013-06-02 DIAGNOSIS — M542 Cervicalgia: Secondary | ICD-10-CM

## 2013-06-02 DIAGNOSIS — M5030 Other cervical disc degeneration, unspecified cervical region: Secondary | ICD-10-CM | POA: Diagnosis not present

## 2013-06-02 DIAGNOSIS — R945 Abnormal results of liver function studies: Principal | ICD-10-CM

## 2013-06-02 DIAGNOSIS — M47812 Spondylosis without myelopathy or radiculopathy, cervical region: Secondary | ICD-10-CM | POA: Diagnosis not present

## 2013-06-02 DIAGNOSIS — E119 Type 2 diabetes mellitus without complications: Secondary | ICD-10-CM

## 2013-06-02 DIAGNOSIS — R7989 Other specified abnormal findings of blood chemistry: Secondary | ICD-10-CM | POA: Diagnosis not present

## 2013-06-02 LAB — COMPREHENSIVE METABOLIC PANEL
ALK PHOS: 107 U/L (ref 39–117)
ALT: 21 U/L (ref 0–35)
AST: 24 U/L (ref 0–37)
Albumin: 4.2 g/dL (ref 3.5–5.2)
BILIRUBIN TOTAL: 0.8 mg/dL (ref 0.2–1.2)
BUN: 11 mg/dL (ref 6–23)
CO2: 27 mEq/L (ref 19–32)
CREATININE: 0.72 mg/dL (ref 0.50–1.10)
Calcium: 9.6 mg/dL (ref 8.4–10.5)
Chloride: 98 mEq/L (ref 96–112)
Glucose, Bld: 115 mg/dL — ABNORMAL HIGH (ref 70–99)
Potassium: 3.6 mEq/L (ref 3.5–5.3)
SODIUM: 137 meq/L (ref 135–145)
TOTAL PROTEIN: 7.2 g/dL (ref 6.0–8.3)

## 2013-06-02 LAB — HEMOGLOBIN A1C
Hgb A1c MFr Bld: 6.1 % — ABNORMAL HIGH (ref <5.7)
Mean Plasma Glucose: 128 mg/dL — ABNORMAL HIGH (ref <117)

## 2013-06-02 MED ORDER — OXYCODONE-ACETAMINOPHEN 5-325 MG PO TABS: ORAL_TABLET | ORAL | Status: DC

## 2013-06-02 MED ORDER — CYCLOBENZAPRINE HCL 10 MG PO TABS: ORAL_TABLET | ORAL | Status: DC

## 2013-06-02 NOTE — Progress Notes (Signed)
Subjective:    Patient ID: Allison [name], female    DOB: [birth year], 68 y.o.   MRN: [id number]  HPI  Allison [name] is here for follow up several issues.   She is very happy that she has lost 18lbs since  12/03  .  She is doing aerobic exercise at [facility] and has cut back on the beer she drinnks  She has seen Dr. Henrene Pastor who discussed fatty liver disease and NASH with pt.  [facility]  SBP ranges 010-272 diastolic  53-66.  Allison [name] does have an element of white coat syndrome  3 days ago woke up with severe neck pain.  Similar episode in 2012 and was felt to have muscle spasm.  Responded to flexeril and short course of percocet. She is not using any upper body weight but is one one machine that uses her arms.  Denies injury or trauma to neck.  She has had bilateral rotator cuff repair.    No Known Allergies Past Medical History  Diagnosis Date  . Diverticulosis   . Hyperlipidemia   . Thyroid disease     hypo  . Elevated serum alkaline phosphatase level   . Fatty liver disease, nonalcoholic   . Obesity   . DJD (degenerative joint disease) of hip   . DJD (degenerative joint disease) of hip     s/P R hip replacement  . Internal hemorrhoids    Past Surgical History  Procedure Laterality Date  . Rotator cuff repair  1996, 1998, 2008  . Joint replacement  2004    R hip  . Toe fusion  2000's  . Dental surgery     History   Social History  . Marital Status: Married    Spouse Name: N/A    Number of Children: 0  . Years of Education: N/A   Occupational History  . Retired    Social History Main Topics  . Smoking status: Former Smoker -- 2.00 packs/day for 20 years    Types: Cigarettes    Quit date: 04/03/1983  . Smokeless tobacco: Never Used  . Alcohol Use: 3.5 oz/week    7 drink(s) per week     Comment: 0-2 daily  . Drug Use: No  . Sexual Activity: No   Other Topics Concern  . Not on file   Social History Narrative  . No narrative on file    Family History  Problem Relation Age of Onset  . Cancer Mother 71    colon  . Heart disease Father   . HIV Brother    Patient Active Problem List   Diagnosis Date Noted  . Type II or unspecified type diabetes mellitus without mention of complication, not stated as uncontrolled 03/17/2013  . Hx of bone density study 03/12/2013  . Elevated transaminase measurement 03/04/2013  . HTN (hypertension) 03/04/2013  . Osteopenia 03/04/2013  . Dyspnea 01/20/2013  . Bronchospasm, acute 11/22/2011  . Urticaria 11/22/2011  . Family history of colon cancer 09/09/2011  . Obesity 09/09/2011  . Hypokalemia 06/18/2011  . Hyperlipidemia   . Thyroid disease   . Diverticulosis   . Elevated serum alkaline phosphatase level   . Fatty liver disease, nonalcoholic   . DJD (degenerative joint disease) of hip    Current Outpatient Prescriptions on File Prior to Visit  Medication Sig Dispense Refill  . albuterol (PROVENTIL HFA;VENTOLIN HFA) 108 (90 BASE) MCG/ACT inhaler Take 2 inhalations tid as needed  1 Inhaler  1  . atorvastatin (LIPITOR) 20 MG tablet TAKE 1 TABLET EVERY DAY  90 tablet  0  . Calcium Carbonate-Vit D-Min (CALCIUM 1200 PO) Take 1 tablet by mouth daily.        . Cholecalciferol (VITAMIN D3) 5000 UNITS TABS Take 2,000 Units by mouth daily.       . furosemide (LASIX) 80 MG tablet Take 1 tablet (80 mg total) by mouth daily.  30 tablet  0  . Ibuprofen 200 MG CAPS Take 3 capsules (600 mg total) by mouth every 6 (six) hours as needed.  120 each  0  . levothyroxine (SYNTHROID, LEVOTHROID) 125 MCG tablet Take 1 tablet (125 mcg total) by mouth daily.  30 tablet  0  . potassium chloride (MICRO-K) 10 MEQ CR capsule Take 2 tablets daily  60 capsule  0  . RABEprazole (ACIPHEX) 20 MG tablet Take 1 tablet (20 mg total) by mouth daily.  90 tablet  3   No current facility-administered medications on file prior to visit.     Review of Systems    see HPI Objective:   Physical Exam  Physical Exam   Nursing note and vitals reviewed.   epeat BP Constitutional: She is oriented to person, place, and time. She appears well-developed and well-nourished.  HENT:  Head: Normocephalic and atraumatic. Neck  Posterior suboccipital muscles very stiff.  Limited ROM due to pain in neck  Cardiovascular: Normal rate and regular rhythm. Exam reveals no gallop and no friction rub.  No murmur heard.  Pulmonary/Chest: Breath sounds normal. She has no wheezes. She has no rales.  Neurological: She is alert and oriented to person, place, and time.  Skin: Skin is warm and dry.  Psychiatric: She has a normal mood and affect. Her behavior is normal.        Assessment & Plan:  Elevated BP   136/74   I believe Allison [name] has an element of white coat HTN    Neck pain likely muscle spasm .  Will get cervical spine xray today.  Flexeril 10 mg 1/2 to one tablet q8h prn and Percocet 5/3  Morbid obesity  She has lost 18 lbs.  Continue exercise avoidance ETOH   Fatty liver disease  See above  Will check chemistry today  Abnormal AIC  Will rehceck today  Expect improvement with weight loss   See me next week to recheck her cervical  Spasm

## 2013-06-02 NOTE — Patient Instructions (Signed)
Take medication as ordered  Go to xray today  See me in one week or sooner as needed

## 2013-06-03 ENCOUNTER — Telehealth: Payer: Self-pay | Admitting: *Deleted

## 2013-06-03 ENCOUNTER — Encounter: Payer: Self-pay | Admitting: *Deleted

## 2013-06-03 NOTE — Telephone Encounter (Signed)
Notified pt of x ray results

## 2013-06-03 NOTE — Telephone Encounter (Signed)
Message copied by Conley Rolls on Wed Jun 03, 2013 10:58 AM ------      Message from: Emi Belfast D      Created: Wed Jun 03, 2013  9:23 AM       Jolayne Haines            Call [name] and let her know that her neck xray shows moderate arthritis with bone spurs.   Have her keep follow up appt with me or call office sooner if not better ------

## 2013-06-10 ENCOUNTER — Encounter: Payer: Self-pay | Admitting: Internal Medicine

## 2013-06-10 ENCOUNTER — Ambulatory Visit (INDEPENDENT_AMBULATORY_CARE_PROVIDER_SITE_OTHER): Payer: Medicare Other | Admitting: Internal Medicine

## 2013-06-10 VITALS — BP 123/71 | HR 63 | Temp 97.8°F | Resp 18 | Wt 201.0 lb

## 2013-06-10 DIAGNOSIS — M542 Cervicalgia: Secondary | ICD-10-CM

## 2013-06-10 DIAGNOSIS — K7689 Other specified diseases of liver: Secondary | ICD-10-CM

## 2013-06-10 DIAGNOSIS — S161XXA Strain of muscle, fascia and tendon at neck level, initial encounter: Secondary | ICD-10-CM

## 2013-06-10 DIAGNOSIS — R030 Elevated blood-pressure reading, without diagnosis of hypertension: Secondary | ICD-10-CM | POA: Diagnosis not present

## 2013-06-10 DIAGNOSIS — K760 Fatty (change of) liver, not elsewhere classified: Secondary | ICD-10-CM

## 2013-06-10 DIAGNOSIS — IMO0001 Reserved for inherently not codable concepts without codable children: Secondary | ICD-10-CM

## 2013-06-10 NOTE — Patient Instructions (Signed)
See me in 3 months

## 2013-06-10 NOTE — Progress Notes (Signed)
Subjective:    Patient ID: Allison [name], female    DOB: 18-Mar-1946, 68 y.o.   MRN: [id number]  HPI  Allison [name] is here for follow up of cervical muscle spasm  .  Much improved  "90% better"  Plain film shows not sig change since 2012  Fatty liver  See labs now normal  Elevated BP  BP normal    Obesity  Allison [name] has lost another 4 Lbs since last week  No Known Allergies Past Medical History  Diagnosis Date  . Diverticulosis   . Hyperlipidemia   . Thyroid disease     hypo  . Elevated serum alkaline phosphatase level   . Fatty liver disease, nonalcoholic   . Obesity   . DJD (degenerative joint disease) of hip   . DJD (degenerative joint disease) of hip     s/P R hip replacement  . Internal hemorrhoids    Past Surgical History  Procedure Laterality Date  . Rotator cuff repair  1996, 1998, 2008  . Joint replacement  2004    R hip  . Toe fusion  2000's  . Dental surgery     History   Social History  . Marital Status: Married    Spouse Name: N/A    Number of Children: 0  . Years of Education: N/A   Occupational History  . Retired    Social History Main Topics  . Smoking status: Former Smoker -- 2.00 packs/day for 20 years    Types: Cigarettes    Quit date: 04/03/1983  . Smokeless tobacco: Never Used  . Alcohol Use: 3.5 oz/week    7 drink(s) per week     Comment: 0-2 daily  . Drug Use: No  . Sexual Activity: No   Other Topics Concern  . Not on file   Social History Narrative  . No narrative on file   Family History  Problem Relation Age of Onset  . Cancer Mother 63    colon  . Heart disease Father   . HIV Brother    Patient Active Problem List   Diagnosis Date Noted  . Type II or unspecified type diabetes mellitus without mention of complication, not stated as uncontrolled 03/17/2013  . Hx of bone density study 03/12/2013  . Elevated transaminase measurement 03/04/2013  . HTN (hypertension) 03/04/2013  . Osteopenia 03/04/2013  . Dyspnea 01/20/2013    . Bronchospasm, acute 11/22/2011  . Urticaria 11/22/2011  . Family history of colon cancer 09/09/2011  . Obesity 09/09/2011  . Hypokalemia 06/18/2011  . Hyperlipidemia   . Thyroid disease   . Diverticulosis   . Elevated serum alkaline phosphatase level   . Fatty liver disease, nonalcoholic   . DJD (degenerative joint disease) of hip    Current Outpatient Prescriptions on File Prior to Visit  Medication Sig Dispense Refill  . atorvastatin (LIPITOR) 20 MG tablet TAKE 1 TABLET EVERY DAY  90 tablet  0  . Calcium Carbonate-Vit D-Min (CALCIUM 1200 PO) Take 1 tablet by mouth daily.        . Cholecalciferol (VITAMIN D3) 5000 UNITS TABS Take 2,000 Units by mouth daily.       . cyclobenzaprine (FLEXERIL) 10 MG tablet Take 1/2 or one whole tablet  q8h prn spasm  30 tablet  0  . furosemide (LASIX) 80 MG tablet Take 1 tablet (80 mg total) by mouth daily.  30 tablet  0  . Ibuprofen 200 MG CAPS Take 3 capsules (600 mg total) by mouth  every 6 (six) hours as needed.  120 each  0  . levothyroxine (SYNTHROID, LEVOTHROID) 125 MCG tablet Take 1 tablet (125 mcg total) by mouth daily.  30 tablet  0  . oxyCODONE-acetaminophen (ROXICET) 5-325 MG per tablet Take one q8h  Prn pain  10 tablet  0  . potassium chloride (MICRO-K) 10 MEQ CR capsule Take 2 tablets daily  60 capsule  0  . RABEprazole (ACIPHEX) 20 MG tablet Take 1 tablet (20 mg total) by mouth daily.  90 tablet  3  . albuterol (PROVENTIL HFA;VENTOLIN HFA) 108 (90 BASE) MCG/ACT inhaler Take 2 inhalations tid as needed  1 Inhaler  1   No current facility-administered medications on file prior to visit.     Review of Systems See HPI    Objective:   Physical Exam Physical Exam  Nursing note and vitals reviewed.  Constitutional: She is oriented to person, place, and time. She appears well-developed and well-nourished.  HENT:  Head: Normocephalic and atraumatic.  Neck  Good ROM without pain.  Less stiffness to posterior cervical  muscles Cardiovascular: Normal rate and regular rhythm. Exam reveals no gallop and no friction rub.  No murmur heard.  Pulmonary/Chest: Breath sounds normal. She has no wheezes. She has no rales.  Neurological: She is alert and oriented to person, place, and time.  Skin: Skin is warm and dry.  Psychiatric: She has a normal mood and affect. Her behavior is normal.       Assessment & Plan:  Cervical strain  OK to decrease Flexeril   Elevated BP now normal  Obesity great weight loss  Continue good work  Fatty Liver:  Now normal

## 2013-06-11 DIAGNOSIS — M461 Sacroiliitis, not elsewhere classified: Secondary | ICD-10-CM | POA: Diagnosis not present

## 2013-06-11 DIAGNOSIS — M47817 Spondylosis without myelopathy or radiculopathy, lumbosacral region: Secondary | ICD-10-CM | POA: Diagnosis not present

## 2013-06-11 DIAGNOSIS — M545 Low back pain, unspecified: Secondary | ICD-10-CM | POA: Diagnosis not present

## 2013-06-12 ENCOUNTER — Other Ambulatory Visit: Payer: Self-pay | Admitting: Internal Medicine

## 2013-06-15 NOTE — Telephone Encounter (Signed)
duplicate

## 2013-06-17 ENCOUNTER — Other Ambulatory Visit: Payer: Self-pay | Admitting: Internal Medicine

## 2013-06-17 NOTE — Telephone Encounter (Signed)
Refill request  Last CMET 03/11 Normal LFT's

## 2013-06-18 MED ORDER — POTASSIUM CHLORIDE ER 10 MEQ PO CPCR: ORAL_CAPSULE | ORAL | Status: DC

## 2013-06-18 MED ORDER — ATORVASTATIN CALCIUM 20 MG PO TABS: 20.0000 mg | ORAL_TABLET | Freq: Every day | ORAL | Status: DC

## 2013-06-24 DIAGNOSIS — M47817 Spondylosis without myelopathy or radiculopathy, lumbosacral region: Secondary | ICD-10-CM | POA: Diagnosis not present

## 2013-06-25 ENCOUNTER — Telehealth: Payer: Self-pay | Admitting: *Deleted

## 2013-06-25 NOTE — Telephone Encounter (Signed)
Allison [name] called she needed a 90 day supply of the potassium but only a 30 day was sent in.  Can you correct this and call her to let her know?

## 2013-06-26 ENCOUNTER — Other Ambulatory Visit: Payer: Self-pay | Admitting: *Deleted

## 2013-06-26 MED ORDER — POTASSIUM CHLORIDE ER 10 MEQ PO CPCR: ORAL_CAPSULE | ORAL | Status: DC

## 2013-06-26 NOTE — Telephone Encounter (Signed)
Reordered a 90 day supply and notified pt

## 2013-06-30 DIAGNOSIS — M47817 Spondylosis without myelopathy or radiculopathy, lumbosacral region: Secondary | ICD-10-CM | POA: Diagnosis not present

## 2013-07-14 DIAGNOSIS — M47817 Spondylosis without myelopathy or radiculopathy, lumbosacral region: Secondary | ICD-10-CM | POA: Diagnosis not present

## 2013-07-28 DIAGNOSIS — M47817 Spondylosis without myelopathy or radiculopathy, lumbosacral region: Secondary | ICD-10-CM | POA: Diagnosis not present

## 2013-08-17 ENCOUNTER — Other Ambulatory Visit: Payer: Self-pay | Admitting: Internal Medicine

## 2013-09-10 ENCOUNTER — Other Ambulatory Visit: Payer: Self-pay

## 2013-09-10 ENCOUNTER — Other Ambulatory Visit: Payer: Self-pay | Admitting: *Deleted

## 2013-09-10 DIAGNOSIS — E119 Type 2 diabetes mellitus without complications: Secondary | ICD-10-CM

## 2013-09-10 DIAGNOSIS — Z1231 Encounter for screening mammogram for malignant neoplasm of breast: Secondary | ICD-10-CM

## 2013-09-10 LAB — COMPREHENSIVE METABOLIC PANEL
ALBUMIN: 4.2 g/dL (ref 3.5–5.2)
ALK PHOS: 111 U/L (ref 39–117)
ALT: 19 U/L (ref 0–35)
AST: 23 U/L (ref 0–37)
BUN: 17 mg/dL (ref 6–23)
CALCIUM: 9.5 mg/dL (ref 8.4–10.5)
CO2: 28 mEq/L (ref 19–32)
Chloride: 101 mEq/L (ref 96–112)
Creat: 0.8 mg/dL (ref 0.50–1.10)
Glucose, Bld: 104 mg/dL — ABNORMAL HIGH (ref 70–99)
POTASSIUM: 4.3 meq/L (ref 3.5–5.3)
SODIUM: 141 meq/L (ref 135–145)
TOTAL PROTEIN: 6.9 g/dL (ref 6.0–8.3)
Total Bilirubin: 0.7 mg/dL (ref 0.2–1.2)

## 2013-09-10 LAB — HEMOGLOBIN A1C
Hgb A1c MFr Bld: 5.8 % — ABNORMAL HIGH (ref <5.7)
MEAN PLASMA GLUCOSE: 120 mg/dL — AB (ref <117)

## 2013-09-10 NOTE — Telephone Encounter (Signed)
Refill request

## 2013-09-12 MED ORDER — ATORVASTATIN CALCIUM 20 MG PO TABS: 20.0000 mg | ORAL_TABLET | Freq: Every day | ORAL | Status: DC

## 2013-09-14 ENCOUNTER — Encounter: Payer: Self-pay | Admitting: *Deleted

## 2013-09-16 ENCOUNTER — Ambulatory Visit (INDEPENDENT_AMBULATORY_CARE_PROVIDER_SITE_OTHER): Payer: Medicare Other | Admitting: Internal Medicine

## 2013-09-16 ENCOUNTER — Encounter: Payer: Self-pay | Admitting: Internal Medicine

## 2013-09-16 VITALS — BP 133/61 | HR 61 | Resp 16 | Ht 64.0 in | Wt 188.0 lb

## 2013-09-16 DIAGNOSIS — R7309 Other abnormal glucose: Secondary | ICD-10-CM

## 2013-09-16 DIAGNOSIS — I10 Essential (primary) hypertension: Secondary | ICD-10-CM

## 2013-09-16 DIAGNOSIS — K760 Fatty (change of) liver, not elsewhere classified: Secondary | ICD-10-CM

## 2013-09-16 DIAGNOSIS — T148 Other injury of unspecified body region: Secondary | ICD-10-CM | POA: Diagnosis not present

## 2013-09-16 DIAGNOSIS — K7689 Other specified diseases of liver: Secondary | ICD-10-CM

## 2013-09-16 DIAGNOSIS — W57XXXA Bitten or stung by nonvenomous insect and other nonvenomous arthropods, initial encounter: Secondary | ICD-10-CM

## 2013-09-16 MED ORDER — METHYLPREDNISOLONE ACETATE 80 MG/ML IJ SUSP
160.0000 mg | Freq: Once | INTRAMUSCULAR | Status: AC
  Administered 2013-09-16: 160 mg via INTRAMUSCULAR

## 2013-09-16 MED ORDER — DOXYCYCLINE HYCLATE 100 MG PO TABS: 100.0000 mg | ORAL_TABLET | Freq: Two times a day (BID) | ORAL | Status: DC

## 2013-09-16 NOTE — Patient Instructions (Signed)
See me as needed 

## 2013-09-16 NOTE — Progress Notes (Signed)
Subjective:    Patient ID: Allison [name], female    DOB: 15-Oct-1945, 68 y.o.   MRN: [id number]  HPI Allison [name] is here for follow up.  She has lost a total of 36 lbs by watching her diet and exercising daily at the [facility]  She has insect bite 2 weeks ago that is increasing in size  Red in color.  No rash no headache no fever  .  She denies tick or spider exposure   No Known Allergies Past Medical History  Diagnosis Date  . Diverticulosis   . Hyperlipidemia   . Thyroid disease     hypo  . Elevated serum alkaline phosphatase level   . Fatty liver disease, nonalcoholic   . Obesity   . DJD (degenerative joint disease) of hip   . DJD (degenerative joint disease) of hip     s/P R hip replacement  . Internal hemorrhoids    Past Surgical History  Procedure Laterality Date  . Rotator cuff repair  1996, 1998, 2008  . Joint replacement  2004    R hip  . Toe fusion  2000's  . Dental surgery     History   Social History  . Marital Status: Married    Spouse Name: N/A    Number of Children: 0  . Years of Education: N/A   Occupational History  . Retired    Social History Main Topics  . Smoking status: Former Smoker -- 2.00 packs/day for 20 years    Types: Cigarettes    Quit date: 04/03/1983  . Smokeless tobacco: Never Used  . Alcohol Use: 3.5 oz/week    7 drink(s) per week     Comment: 0-2 daily  . Drug Use: No  . Sexual Activity: No   Other Topics Concern  . Not on file   Social History Narrative  . No narrative on file   Family History  Problem Relation Age of Onset  . Cancer Mother 22    colon  . Heart disease Father   . HIV Brother    Patient Active Problem List   Diagnosis Date Noted  . Type II or unspecified type diabetes mellitus without mention of complication, not stated as uncontrolled 03/17/2013  . Hx of bone density study 03/12/2013  . Elevated transaminase measurement 03/04/2013  . HTN (hypertension) 03/04/2013  . Osteopenia 03/04/2013  . Dyspnea  01/20/2013  . Bronchospasm, acute 11/22/2011  . Urticaria 11/22/2011  . Family history of colon cancer 09/09/2011  . Obesity 09/09/2011  . Hypokalemia 06/18/2011  . Hyperlipidemia   . Thyroid disease   . Diverticulosis   . Elevated serum alkaline phosphatase level   . Fatty liver disease, nonalcoholic   . DJD (degenerative joint disease) of hip    Current Outpatient Prescriptions on File Prior to Visit  Medication Sig Dispense Refill  . albuterol (PROVENTIL HFA;VENTOLIN HFA) 108 (90 BASE) MCG/ACT inhaler Take 2 inhalations tid as needed  1 Inhaler  1  . atorvastatin (LIPITOR) 20 MG tablet Take 1 tablet (20 mg total) by mouth daily.  90 tablet  1  . Calcium Carbonate-Vit D-Min (CALCIUM 1200 PO) Take 1 tablet by mouth daily.        . Cholecalciferol (VITAMIN D3) 5000 UNITS TABS Take 2,000 Units by mouth daily.       . cyclobenzaprine (FLEXERIL) 10 MG tablet Take 1/2 or one whole tablet  q8h prn spasm  30 tablet  0  . furosemide (LASIX) 80 MG tablet  TAKE 1 TABLET EVERY DAY  90 tablet  1  . Ibuprofen 200 MG CAPS Take 3 capsules (600 mg total) by mouth every 6 (six) hours as needed.  120 each  0  . levothyroxine (SYNTHROID, LEVOTHROID) 125 MCG tablet TAKE 1 TABLET EVERY DAY  90 tablet  1  . oxyCODONE-acetaminophen (ROXICET) 5-325 MG per tablet Take one q8h  Prn pain  10 tablet  0  . potassium chloride (MICRO-K) 10 MEQ CR capsule Take 2 tablets daily  180 capsule  0  . RABEprazole (ACIPHEX) 20 MG tablet Take 1 tablet (20 mg total) by mouth daily.  90 tablet  3   No current facility-administered medications on file prior to visit.       Review of Systems See HPI    Objective:   Physical Exam Physical Exam  Nursing note and vitals reviewed.  Constitutional: She is oriented to person, place, and time. She appears well-developed and well-nourished.  HENT:  Head: Normocephalic and atraumatic.  Cardiovascular: Normal rate and regular rhythm. Exam reveals no gallop and no friction rub.    No murmur heard.  Pulmonary/Chest: Breath sounds normal. She has no wheezes. She has no rales.  Neurological: She is alert and oriented to person, place, and time.  Skin: Skin is warm and dry.  She does have 3 very red papules consistant with insect bites.   Psychiatric: She has a normal mood and affect. Her behavior is normal.        Assessment & Plan:  Elevated glucose last two checks now normal  Insect bite  Will give depomedrol  160 mg im today.  Cover empirically with doxycycline 100 mg bid

## 2013-10-12 ENCOUNTER — Ambulatory Visit
Admission: RE | Admit: 2013-10-12 | Discharge: 2013-10-12 | Disposition: A | Discharge status: Home / Self Care | Payer: Medicare Other | Source: Ambulatory Visit

## 2013-10-12 DIAGNOSIS — Z1231 Encounter for screening mammogram for malignant neoplasm of breast: Secondary | ICD-10-CM | POA: Diagnosis not present

## 2013-10-30 ENCOUNTER — Other Ambulatory Visit: Payer: Self-pay | Admitting: Internal Medicine

## 2013-11-02 NOTE — Telephone Encounter (Signed)
Requested Medications     Medication name:  Name from pharmacy:  furosemide (LASIX) 80 MG tablet  FUROSEMIDE 80 MG Tablet    Sig: TAKE 1 TABLET EVERY DAY    Dispense: 90 tablet Refills: 1 Start: 10/30/2013  Class: Normal    Requested on: 06/18/2013    Originally ordered on: 10/25/2010 Last refill: 09/05/2013 Order History and Details           Medication name:  Name from pharmacy:  levothyroxine (SYNTHROID, LEVOTHROID) 125 MCG tablet  LEVOTHYROXINE SODIUM 125 MCG Tablet    Sig: TAKE 1 TABLET EVERY DAY    Dispense: 90 tablet Refills: 1 Start: 10/30/2013  Class: Normal    Requested on: 06/18/2013    Originally ordered on: 10/25/2010 Last refill: 09/05/2013 Order History and Details

## 2013-11-03 ENCOUNTER — Other Ambulatory Visit: Payer: Self-pay | Admitting: Internal Medicine

## 2013-11-03 NOTE — Telephone Encounter (Signed)
Requested Medications     Medication name:  Name from pharmacy:  potassium chloride (MICRO-K) 10 MEQ CR capsule  POTASSIUM CHLORIDE ER 10 MEQ Capsule Extended Release    Sig: TAKE 2 CAPSULES EVERY DAY    Dispense: 180 capsule Refills: 1 Start: 11/03/2013  Class: Normal    Requested on: 02/12/2013    Originally ordered on: 10/25/2010 Last refill: 04/20/2013 Order History and Details

## 2014-01-08 ENCOUNTER — Ambulatory Visit (INDEPENDENT_AMBULATORY_CARE_PROVIDER_SITE_OTHER): Payer: Medicare Other | Admitting: *Deleted

## 2014-01-08 DIAGNOSIS — Z0000 Encounter for general adult medical examination without abnormal findings: Secondary | ICD-10-CM

## 2014-01-08 DIAGNOSIS — Z23 Encounter for immunization: Secondary | ICD-10-CM

## 2014-01-08 DIAGNOSIS — E785 Hyperlipidemia, unspecified: Secondary | ICD-10-CM

## 2014-01-08 DIAGNOSIS — E039 Hypothyroidism, unspecified: Secondary | ICD-10-CM

## 2014-01-14 DIAGNOSIS — E039 Hypothyroidism, unspecified: Secondary | ICD-10-CM | POA: Diagnosis not present

## 2014-01-14 DIAGNOSIS — E785 Hyperlipidemia, unspecified: Secondary | ICD-10-CM | POA: Diagnosis not present

## 2014-01-14 DIAGNOSIS — Z0000 Encounter for general adult medical examination without abnormal findings: Secondary | ICD-10-CM | POA: Diagnosis not present

## 2014-01-14 DIAGNOSIS — Z0189 Encounter for other specified special examinations: Secondary | ICD-10-CM | POA: Diagnosis not present

## 2014-01-14 LAB — CBC WITH DIFFERENTIAL/PLATELET
Basophils Absolute: 0.1 10*3/uL (ref 0.0–0.1)
Basophils Relative: 1 % (ref 0–1)
EOS PCT: 1 % (ref 0–5)
Eosinophils Absolute: 0.1 10*3/uL (ref 0.0–0.7)
HEMATOCRIT: 39.4 % (ref 36.0–46.0)
Hemoglobin: 13.3 g/dL (ref 12.0–15.0)
LYMPHS PCT: 21 % (ref 12–46)
Lymphs Abs: 1.4 10*3/uL (ref 0.7–4.0)
MCH: 31 pg (ref 26.0–34.0)
MCHC: 33.8 g/dL (ref 30.0–36.0)
MCV: 91.8 fL (ref 78.0–100.0)
Monocytes Absolute: 0.6 10*3/uL (ref 0.1–1.0)
Monocytes Relative: 8 % (ref 3–12)
Neutro Abs: 4.8 10*3/uL (ref 1.7–7.7)
Neutrophils Relative %: 69 % (ref 43–77)
PLATELETS: 290 10*3/uL (ref 150–400)
RBC: 4.29 MIL/uL (ref 3.87–5.11)
RDW: 14 % (ref 11.5–15.5)
WBC: 6.9 10*3/uL (ref 4.0–10.5)

## 2014-01-14 LAB — LIPID PANEL
CHOL/HDL RATIO: 2.3 ratio
Cholesterol: 188 mg/dL (ref 0–200)
HDL: 82 mg/dL (ref >39)
LDL Cholesterol: 91 mg/dL (ref 0–99)
Triglycerides: 76 mg/dL (ref <150)
VLDL: 15 mg/dL (ref 0–40)

## 2014-01-14 LAB — COMPLETE METABOLIC PANEL WITH GFR
ALK PHOS: 102 U/L (ref 39–117)
ALT: 16 U/L (ref 0–35)
AST: 22 U/L (ref 0–37)
Albumin: 4.3 g/dL (ref 3.5–5.2)
BUN: 17 mg/dL (ref 6–23)
CO2: 29 mEq/L (ref 19–32)
Calcium: 9.6 mg/dL (ref 8.4–10.5)
Chloride: 103 mEq/L (ref 96–112)
Creat: 0.72 mg/dL (ref 0.50–1.10)
GFR, Est African American: >89 mL/min
GFR, Est Non African American: 88 mL/min
Glucose, Bld: 98 mg/dL (ref 70–99)
Potassium: 3.9 mEq/L (ref 3.5–5.3)
SODIUM: 141 meq/L (ref 135–145)
TOTAL PROTEIN: 6.8 g/dL (ref 6.0–8.3)
Total Bilirubin: 0.7 mg/dL (ref 0.2–1.2)

## 2014-01-14 LAB — TSH: TSH: 0.024 u[IU]/mL — ABNORMAL LOW (ref 0.350–4.500)

## 2014-01-15 LAB — VITAMIN D 25 HYDROXY (VIT D DEFICIENCY, FRACTURES): VIT D 25 HYDROXY: 75 ng/mL (ref 30–89)

## 2014-01-21 ENCOUNTER — Encounter: Payer: Self-pay | Admitting: Internal Medicine

## 2014-01-21 ENCOUNTER — Ambulatory Visit (INDEPENDENT_AMBULATORY_CARE_PROVIDER_SITE_OTHER): Payer: Medicare Other | Admitting: Internal Medicine

## 2014-01-21 VITALS — BP 156/62 | HR 54 | Temp 97.8°F | Resp 16 | Ht 62.5 in | Wt 175.0 lb

## 2014-01-21 DIAGNOSIS — Z23 Encounter for immunization: Secondary | ICD-10-CM | POA: Diagnosis not present

## 2014-01-21 DIAGNOSIS — Z0189 Encounter for other specified special examinations: Secondary | ICD-10-CM

## 2014-01-21 DIAGNOSIS — Z0000 Encounter for general adult medical examination without abnormal findings: Secondary | ICD-10-CM | POA: Diagnosis not present

## 2014-01-21 DIAGNOSIS — K760 Fatty (change of) liver, not elsewhere classified: Secondary | ICD-10-CM

## 2014-01-21 DIAGNOSIS — Z1211 Encounter for screening for malignant neoplasm of colon: Secondary | ICD-10-CM | POA: Diagnosis not present

## 2014-01-21 DIAGNOSIS — E079 Disorder of thyroid, unspecified: Secondary | ICD-10-CM | POA: Diagnosis not present

## 2014-01-21 DIAGNOSIS — E785 Hyperlipidemia, unspecified: Secondary | ICD-10-CM

## 2014-01-21 DIAGNOSIS — I10 Essential (primary) hypertension: Secondary | ICD-10-CM | POA: Diagnosis not present

## 2014-01-21 LAB — HEMOCCULT GUIAC POC 1CARD (OFFICE): Fecal Occult Blood, POC: NEGATIVE

## 2014-01-21 LAB — POCT URINALYSIS DIPSTICK
Bilirubin, UA: NEGATIVE
Blood, UA: NEGATIVE
Glucose, UA: NEGATIVE
Ketones, UA: NEGATIVE
LEUKOCYTES UA: NEGATIVE
NITRITE UA: NEGATIVE
PH UA: 6.5
PROTEIN UA: NEGATIVE
Spec Grav, UA: 1.015
Urobilinogen, UA: NEGATIVE

## 2014-01-21 NOTE — Patient Instructions (Signed)
Take one whole thyroid tablet daily and 1/2 sat and Sunday   See me end of December

## 2014-01-21 NOTE — Progress Notes (Signed)
Subjective:    Patient ID: Allison [name], female    DOB: December 07, 1945, 68 y.o.   MRN: [id number]  HPI Allison [name] is here for CPE  HM:  UTD with mm,  Pap due 2017, Dexa 2014 ,  Colonoscopy due in November .  She is a 40 pack year smoker but quit 20 years ago.  Intentioanal wt loss   Allison [name] reports she has lost close to 50# by her scales.  Over 30# by my scale  HTN  Tolerating meds  Hpyerlipidemia normal levels now  Hypothryodisim  Over-replaced   No Known Allergies Past Medical History  Diagnosis Date  . Diverticulosis   . Hyperlipidemia   . Thyroid disease     hypo  . Elevated serum alkaline phosphatase level   . Fatty liver disease, nonalcoholic   . Obesity   . DJD (degenerative joint disease) of hip   . DJD (degenerative joint disease) of hip     s/P R hip replacement  . Internal hemorrhoids    Past Surgical History  Procedure Laterality Date  . Rotator cuff repair  1996, 1998, 2008  . Joint replacement  2004    R hip  . Toe fusion  2000's  . Dental surgery     History   Social History  . Marital Status: Married    Spouse Name: N/A    Number of Children: 0  . Years of Education: N/A   Occupational History  . Retired    Social History Main Topics  . Smoking status: Former Smoker -- 2.00 packs/day for 20 years    Types: Cigarettes    Quit date: 04/03/1983  . Smokeless tobacco: Never Used  . Alcohol Use: 3.5 oz/week    7 drink(s) per week     Comment: 0-2 daily  . Drug Use: No  . Sexual Activity: No   Other Topics Concern  . Not on file   Social History Narrative  . No narrative on file   Family History  Problem Relation Age of Onset  . Cancer Mother 74    colon  . Heart disease Father   . HIV Brother    Patient Active Problem List   Diagnosis Date Noted  . Hx of bone density study 03/12/2013  . Elevated transaminase measurement 03/04/2013  . HTN (hypertension) 03/04/2013  . Osteopenia 03/04/2013  . Dyspnea 01/20/2013  . Bronchospasm,  acute 11/22/2011  . Urticaria 11/22/2011  . Family history of colon cancer 09/09/2011  . Obesity 09/09/2011  . Hypokalemia 06/18/2011  . Hyperlipidemia   . Thyroid disease   . Diverticulosis   . Elevated serum alkaline phosphatase level   . Fatty liver disease, nonalcoholic   . DJD (degenerative joint disease) of hip    Current Outpatient Prescriptions on File Prior to Visit  Medication Sig Dispense Refill  . albuterol (PROVENTIL HFA;VENTOLIN HFA) 108 (90 BASE) MCG/ACT inhaler Take 2 inhalations tid as needed  1 Inhaler  1  . atorvastatin (LIPITOR) 20 MG tablet Take 1 tablet (20 mg total) by mouth daily.  90 tablet  1  . Calcium Carbonate-Vit D-Min (CALCIUM 1200 PO) Take 1 tablet by mouth daily.        . Cholecalciferol (VITAMIN D3) 5000 UNITS TABS Take 2,000 Units by mouth daily.       . cyclobenzaprine (FLEXERIL) 10 MG tablet Take 1/2 or one whole tablet  q8h prn spasm  30 tablet  0  . furosemide (LASIX) 80 MG tablet TAKE 1  TABLET EVERY DAY  90 tablet  1  . Ibuprofen 200 MG CAPS Take 3 capsules (600 mg total) by mouth every 6 (six) hours as needed.  120 each  0  . levothyroxine (SYNTHROID, LEVOTHROID) 125 MCG tablet TAKE 1 TABLET EVERY DAY  90 tablet  1  . potassium chloride (MICRO-K) 10 MEQ CR capsule TAKE 2 CAPSULES EVERY DAY  180 capsule  1  . RABEprazole (ACIPHEX) 20 MG tablet Take 1 tablet (20 mg total) by mouth daily.  90 tablet  3   No current facility-administered medications on file prior to visit.       Review of Systems  Respiratory: Negative for cough, chest tightness, shortness of breath and wheezing.   Cardiovascular: Negative for chest pain, palpitations and leg swelling.  Gastrointestinal: Negative for abdominal pain.       Objective:   Physical Exam Physical Exam  Vital signs and nursing note reviewed  Constitutional: She is oriented to person, place, and time. She appears well-developed and well-nourished. She is cooperative.  HENT:  Head:  Normocephalic and atraumatic.  Right Ear: Tympanic membrane normal.  Left Ear: Tympanic membrane normal.  Nose: Nose normal.  Mouth/Throat: Oropharynx is clear and moist and mucous membranes are normal. No oropharyngeal exudate or posterior oropharyngeal erythema.  Eyes: Conjunctivae and EOM are normal. Pupils are equal, round, and reactive to light.  Neck: Neck supple. No JVD present. Carotid bruit is not present. No mass and no thyromegaly present.  Cardiovascular: Regular rhythm, normal heart sounds, intact distal pulses and normal pulses.  Exam reveals no gallop and no friction rub.   No murmur heard. Pulses:      Dorsalis pedis pulses are 2+ on the right side, and 2+ on the left side.  Pulmonary/Chest: Breath sounds normal. She has no wheezes. She has no rhonchi. She has no rales. Right breast exhibits no mass, no nipple discharge and no skin change. Left breast exhibits no mass, no nipple discharge and no skin change. Breasts no discrete mass no nipple discharge no axillary adenopathy bilaterally  Abdominal: Soft. Bowel sounds are normal. She exhibits no distension and no mass. There is no hepatosplenomegaly. There is no tenderness. There is no CVA tenderness.  Genitourinary: Rectum normal, vagina normal and uterus normal. Rectal exam shows no mass. Guaiac negative stool. No labial fusion. There is no lesion on the right labia. There is no lesion on the left labia. Cervix exhibits no motion tenderness. Right adnexum displays no mass, no tenderness and no fullness. Left adnexum displays no mass, no tenderness and no fullness. No erythema around the vagina.  Musculoskeletal:       No active synovitis to any joint.    Lymphadenopathy:       Right cervical: No superficial cervical adenopathy present.      Left cervical: No superficial cervical adenopathy present.       Right axillary: No pectoral and no lateral adenopathy present.       Left axillary: No pectoral and no lateral adenopathy  present.      Right: No inguinal adenopathy present.       Left: No inguinal adenopathy present.  Neurological: She is alert and oriented to person, place, and time. She has normal strength and normal reflexes. No cranial nerve deficit or sensory deficit. She displays a negative Romberg sign. Coordination and gait normal.  Skin: Skin is warm and dry. No abrasion, no bruising, no ecchymosis and no rash noted. No cyanosis. Nails show no  clubbing.  Psychiatric: She has a normal mood and affect. Her speech is normal and behavior is normal.        Assessment & Plan:   HM:  Tdap today   Pap due 2017  Colonoscopy due in NOvember  She is a 40 year pack smoker but quit 20 years ago    HTN  Continue meds  Hyporthyroidism/:  with weight loss now over-sup[ressed.  will lchange med to one daily and 1/2 tab Saturday and Sunday   Hyperlipidemia  Normal with weight loss  Long term smoker:  She quit 20 years ago  And I discussed Lung cancer screening guideline.  She does not wish imaging   Elevated lfts   Now normalized with weight loss    Intentional wt loss  Keep up the good work   Diverticulosis  See me end of December       Assessment & Plan:

## 2014-02-04 ENCOUNTER — Ambulatory Visit: Payer: Medicare Other

## 2014-02-16 ENCOUNTER — Other Ambulatory Visit: Payer: Self-pay | Admitting: *Deleted

## 2014-02-16 ENCOUNTER — Ambulatory Visit (INDEPENDENT_AMBULATORY_CARE_PROVIDER_SITE_OTHER): Payer: Medicare Other | Admitting: *Deleted

## 2014-02-16 DIAGNOSIS — Z23 Encounter for immunization: Secondary | ICD-10-CM | POA: Diagnosis not present

## 2014-02-16 MED ORDER — RABEPRAZOLE SODIUM 20 MG PO TBEC: 20.0000 mg | DELAYED_RELEASE_TABLET | Freq: Every day | ORAL | Status: DC

## 2014-02-18 MED ORDER — LEVOTHYROXINE SODIUM 125 MCG PO TABS: 125.0000 ug | ORAL_TABLET | Freq: Every day | ORAL | Status: DC

## 2014-02-18 MED ORDER — FUROSEMIDE 80 MG PO TABS: 80.0000 mg | ORAL_TABLET | Freq: Every day | ORAL | Status: DC

## 2014-02-18 MED ORDER — POTASSIUM CHLORIDE ER 10 MEQ PO CPCR: 20.0000 meq | ORAL_CAPSULE | Freq: Every day | ORAL | Status: DC

## 2014-02-18 MED ORDER — ATORVASTATIN CALCIUM 20 MG PO TABS: 20.0000 mg | ORAL_TABLET | Freq: Every day | ORAL | Status: DC

## 2014-03-15 ENCOUNTER — Other Ambulatory Visit: Payer: Self-pay | Admitting: *Deleted

## 2014-03-15 DIAGNOSIS — E039 Hypothyroidism, unspecified: Secondary | ICD-10-CM

## 2014-03-15 LAB — TSH: TSH: 0.896 u[IU]/mL (ref 0.350–4.500)

## 2014-03-15 LAB — T3, FREE: T3 FREE: 2.2 pg/mL — AB (ref 2.3–4.2)

## 2014-03-15 LAB — T4, FREE: FREE T4: 1.05 ng/dL (ref 0.80–1.80)

## 2014-03-16 ENCOUNTER — Ambulatory Visit (INDEPENDENT_AMBULATORY_CARE_PROVIDER_SITE_OTHER): Payer: Medicare Other | Admitting: Internal Medicine

## 2014-03-16 ENCOUNTER — Encounter: Payer: Self-pay | Admitting: Internal Medicine

## 2014-03-16 VITALS — BP 139/67 | HR 56 | Resp 16 | Ht 62.0 in | Wt 177.0 lb

## 2014-03-16 DIAGNOSIS — E039 Hypothyroidism, unspecified: Secondary | ICD-10-CM | POA: Diagnosis not present

## 2014-03-16 DIAGNOSIS — J9801 Acute bronchospasm: Secondary | ICD-10-CM

## 2014-03-16 DIAGNOSIS — E669 Obesity, unspecified: Secondary | ICD-10-CM | POA: Diagnosis not present

## 2014-03-16 MED ORDER — ALBUTEROL SULFATE HFA 108 (90 BASE) MCG/ACT IN AERS: INHALATION_SPRAY | RESPIRATORY_TRACT | Status: DC

## 2014-03-16 MED ORDER — LEVOTHYROXINE SODIUM 125 MCG PO TABS: ORAL_TABLET | ORAL | Status: DC

## 2014-03-16 NOTE — Patient Instructions (Signed)
See me as needed 

## 2014-03-16 NOTE — Progress Notes (Signed)
   Subjective:    Patient ID: Allison [name], female    DOB: [birth year], 68 y.o.   MRN: [id number]  HPI  12/2013  HM: Tdap today Pap due 2017 Colonoscopy due in NOvember She is a 40 year pack smoker but quit 20 years ago   HTN Continue meds  Hyporthyroidism/: with weight loss now over-sup[ressed. will lchange med to one daily and 1/2 tab Saturday and Sunday   Hyperlipidemia Normal with weight loss  Long term smoker: She quit 20 years ago And I discussed Lung cancer screening guideline. She does not wish imaging   Elevated lfts Now normalized with weight loss   Intentional wt loss Keep up the good work   Diverticulosis  See me end of December    TODAY  [name] returns after adjustment for over-replaced levothyroxine   See labs she is now on 125 mcg levo one whole tablet Mon-Friday and 1/2 tablet sat and Sunday .   Feeling well  Had cough a few weeks back and needs refill on albuterol   Obesity /intentional wt loss   [name] has been doing great with calorie reduction and exercise     Review of Systems See HPI    Objective:   Physical Exam Physical Exam  Nursing note and vitals reviewed.  Constitutional: She is oriented to person, place, and time. She appears well-developed and well-nourished.  HENT:  Head: Normocephalic and atraumatic.  Cardiovascular: Normal rate and regular rhythm. Exam reveals no gallop and no friction rub.  No murmur heard.  Pulmonary/Chest: Breath sounds normal. She has no wheezes. She has no rales.  Neurological: She is alert and oriented to person, place, and time.  Skin: Skin is warm and dry.  Psychiatric: She has a normal mood and affect. Her behavior is normal.        Assessment & Plan:  Hypothyroidism  Continue 1 tab mon-fri and 1/2 tab sat Sunday   bronhospasmasthma  Lungs clear today .  Will refill albuterol   Obesity  Continue  Diet/exercise   See me as needed

## 2014-04-13 ENCOUNTER — Other Ambulatory Visit: Payer: Self-pay | Admitting: Internal Medicine

## 2014-04-14 NOTE — Telephone Encounter (Signed)
Refill request

## 2014-07-09 ENCOUNTER — Other Ambulatory Visit: Payer: Self-pay | Admitting: Internal Medicine

## 2014-07-12 NOTE — Telephone Encounter (Signed)
Refill request

## 2014-07-13 ENCOUNTER — Other Ambulatory Visit: Payer: Self-pay | Admitting: *Deleted

## 2014-07-27 ENCOUNTER — Encounter: Payer: Self-pay | Admitting: Internal Medicine

## 2014-07-27 ENCOUNTER — Ambulatory Visit (INDEPENDENT_AMBULATORY_CARE_PROVIDER_SITE_OTHER): Payer: Medicare Other | Admitting: Internal Medicine

## 2014-07-27 VITALS — BP 157/63 | HR 57 | Resp 16 | Ht 62.0 in | Wt 176.0 lb

## 2014-07-27 DIAGNOSIS — J9801 Acute bronchospasm: Secondary | ICD-10-CM

## 2014-07-27 DIAGNOSIS — I10 Essential (primary) hypertension: Secondary | ICD-10-CM | POA: Diagnosis not present

## 2014-07-27 DIAGNOSIS — E039 Hypothyroidism, unspecified: Secondary | ICD-10-CM

## 2014-07-27 MED ORDER — LEVOTHYROXINE SODIUM 125 MCG PO TABS: ORAL_TABLET | ORAL | Status: DC

## 2014-07-27 MED ORDER — POTASSIUM CHLORIDE ER 10 MEQ PO CPCR: 20.0000 meq | ORAL_CAPSULE | Freq: Every day | ORAL | Status: DC

## 2014-07-27 MED ORDER — FUROSEMIDE 80 MG PO TABS: 80.0000 mg | ORAL_TABLET | Freq: Every day | ORAL | Status: DC

## 2014-07-27 MED ORDER — ALBUTEROL SULFATE HFA 108 (90 BASE) MCG/ACT IN AERS
INHALATION_SPRAY | RESPIRATORY_TRACT | Status: AC
Start: 2014-07-27 — End: ?

## 2014-07-27 MED ORDER — RABEPRAZOLE SODIUM 20 MG PO TBEC: 20.0000 mg | DELAYED_RELEASE_TABLET | Freq: Every day | ORAL | Status: DC

## 2014-07-27 NOTE — Progress Notes (Signed)
Subjective:    Patient ID: [name], female    DOB: Nov 29, 1945, 69 y.o.   MRN: [id number]  HPI  03/16/2014 notre Assessment & Plan:  Hypothyroidism Continue 1 tab mon-fri and 1/2 tab sat Sunday   bronhospasmasthma Lungs clear today . Will refill albuterol   Obesity Continue Diet/exercise   See me as needed       Today  Sula Soda is here for follow up  Asthma doing well Used rescue inhaler once last week  No wheezing  Has left heel pain when exercising  She has worked out a lot at [employer]    She has upcoming appt with Dr. Durward Fortes  No Known Allergies Past Medical History  Diagnosis Date  . Diverticulosis   . Hyperlipidemia   . Thyroid disease     hypo  . Elevated serum alkaline phosphatase level   . Fatty liver disease, nonalcoholic   . Obesity   . DJD (degenerative joint disease) of hip   . DJD (degenerative joint disease) of hip     s/P R hip replacement  . Internal hemorrhoids    Past Surgical History  Procedure Laterality Date  . Rotator cuff repair  1996, 1998, 2008  . Joint replacement  2004    R hip  . Toe fusion  2000's  . Dental surgery     History   Social History  . Marital Status: Married    Spouse Name: N/A  . Number of Children: 0  . Years of Education: N/A   Occupational History  . Retired    Social History Main Topics  . Smoking status: Former Smoker -- 2.00 packs/day for 20 years    Types: Cigarettes    Quit date: 04/03/1983  . Smokeless tobacco: Never Used  . Alcohol Use: 3.5 oz/week    7 Standard drinks or equivalent per week     Comment: 0-2 daily  . Drug Use: No  . Sexual Activity: No   Other Topics Concern  . Not on file   Social History Narrative   Family History  Problem Relation Age of Onset  . Cancer Mother 51    colon  . Heart disease Father   . HIV Brother    Patient Active Problem List   Diagnosis Date Noted  . Hx of bone density study 03/12/2013  . Elevated transaminase measurement  03/04/2013  . HTN (hypertension) 03/04/2013  . Osteopenia 03/04/2013  . Dyspnea 01/20/2013  . Bronchospasm, acute 11/22/2011  . Urticaria 11/22/2011  . Family history of colon cancer 09/09/2011  . Obesity 09/09/2011  . Hypokalemia 06/18/2011  . Hyperlipidemia   . Thyroid disease   . Diverticulosis   . Elevated serum alkaline phosphatase level   . Fatty liver disease, nonalcoholic   . DJD (degenerative joint disease) of hip    Current Outpatient Prescriptions on File Prior to Visit  Medication Sig Dispense Refill  . albuterol (PROVENTIL HFA;VENTOLIN HFA) 108 (90 BASE) MCG/ACT inhaler Take 2 inhalations tid as needed 1 Inhaler 1  . atorvastatin (LIPITOR) 20 MG tablet TAKE 1 TABLET EVERY DAY 90 tablet 1  . Calcium Carbonate-Vit D-Min (CALCIUM 1200 PO) Take 1 tablet by mouth daily.      . Cholecalciferol (VITAMIN D3) 5000 UNITS TABS Take 2,000 Units by mouth daily.     . cyclobenzaprine (FLEXERIL) 10 MG tablet Take 1/2 or one whole tablet  q8h prn spasm 30 tablet 0  . furosemide (LASIX) 80 MG tablet TAKE 1 TABLET  EVERY DAY 90 tablet 1  . Ibuprofen 200 MG CAPS Take 3 capsules (600 mg total) by mouth every 6 (six) hours as needed. 120 each 0  . levothyroxine (SYNTHROID, LEVOTHROID) 125 MCG tablet Take one whole tablet mon through Friday and 1/2 tablet Sat and Sun 90 tablet 3  . potassium chloride (MICRO-K) 10 MEQ CR capsule Take 2 capsules (20 mEq total) by mouth daily. 180 capsule 3  . RABEprazole (ACIPHEX) 20 MG tablet Take 1 tablet (20 mg total) by mouth daily. 90 tablet 3   No current facility-administered medications on file prior to visit.      Review of Systems See HPI    Objective:   Physical Exam  Physical Exam  Nursing note and vitals reviewed.  Constitutional: She is oriented to person, place, and time. She appears well-developed and well-nourished.  HENT:  Head: Normocephalic and atraumatic.  Cardiovascular: Normal rate and regular rhythm. Exam reveals no gallop and  no friction rub.  No murmur heard.  Pulmonary/Chest: Breath sounds normal. She has no wheezes. She has no rales.  Neurological: She is alert and oriented to person, place, and time.  Skin: Skin is warm and dry.  Psychiatric: She has a normal mood and affect. Her behavior is normal.             Assessment & Plan:  HTN  Continue meds    Hypothyroidism  Will reorder meds  Most recent TSH normal  Heel pain  Plantar fasciitis  Vs heel spur.  She wishes to see her orthopedic and does not want imaging today  Intentional weight loss   Sula Soda has been doing great with this  Bronchospasm  Ok for albuterol prn  Advised if using 3 or more times in 24 hour period to seek urgent care   Pt has received letter about my departure and advised to make appt with PCP of choice

## 2014-08-31 ENCOUNTER — Encounter: Payer: Self-pay | Admitting: Internal Medicine

## 2014-09-03 DIAGNOSIS — M65321 Trigger finger, right index finger: Secondary | ICD-10-CM | POA: Diagnosis not present

## 2014-09-03 DIAGNOSIS — M19041 Primary osteoarthritis, right hand: Secondary | ICD-10-CM | POA: Diagnosis not present

## 2014-09-21 ENCOUNTER — Other Ambulatory Visit: Payer: Self-pay

## 2014-09-21 DIAGNOSIS — Z1231 Encounter for screening mammogram for malignant neoplasm of breast: Secondary | ICD-10-CM

## 2014-10-25 ENCOUNTER — Ambulatory Visit
Admission: RE | Admit: 2014-10-25 | Discharge: 2014-10-25 | Disposition: A | Discharge status: Home / Self Care | Payer: Medicare Other | Source: Ambulatory Visit

## 2014-10-25 DIAGNOSIS — Z1231 Encounter for screening mammogram for malignant neoplasm of breast: Secondary | ICD-10-CM | POA: Diagnosis not present

## 2014-11-01 DIAGNOSIS — M79641 Pain in right hand: Secondary | ICD-10-CM | POA: Diagnosis not present

## 2014-11-19 ENCOUNTER — Encounter: Payer: Self-pay | Admitting: Internal Medicine

## 2014-11-19 ENCOUNTER — Ambulatory Visit (INDEPENDENT_AMBULATORY_CARE_PROVIDER_SITE_OTHER): Payer: Medicare Other | Admitting: Internal Medicine

## 2014-11-19 VITALS — BP 138/62 | HR 54 | Temp 98.6°F | Resp 14 | Ht 62.0 in | Wt 174.1 lb

## 2014-11-19 DIAGNOSIS — K760 Fatty (change of) liver, not elsewhere classified: Secondary | ICD-10-CM | POA: Diagnosis not present

## 2014-11-19 DIAGNOSIS — I10 Essential (primary) hypertension: Secondary | ICD-10-CM | POA: Diagnosis not present

## 2014-11-19 DIAGNOSIS — E079 Disorder of thyroid, unspecified: Secondary | ICD-10-CM | POA: Diagnosis not present

## 2014-11-19 DIAGNOSIS — E785 Hyperlipidemia, unspecified: Secondary | ICD-10-CM | POA: Diagnosis not present

## 2014-11-19 NOTE — Progress Notes (Signed)
Pre visit review using our clinic review tool, if applicable. No additional management support is needed unless otherwise documented below in the visit note. 

## 2014-11-19 NOTE — Patient Instructions (Signed)
It was a pleasure to meet you today.  We will see you back in about 6 months to make sure you are still doing well. If you have any problems or questions please feel free to call our office.   When you need refills the first time call our office as the pharmacy may try to contact your previous doctor.   Health Maintenance Adopting a healthy lifestyle and getting preventive care can go a long way to promote health and wellness. Talk with your health care provider about what schedule of regular examinations is right for you. This is a good chance for you to check in with your provider about disease prevention and staying healthy. In between checkups, there are plenty of things you can do on your own. Experts have done a lot of research about which lifestyle changes and preventive measures are most likely to keep you healthy. Ask your health care provider for more information. WEIGHT AND DIET  Eat a healthy diet  Be sure to include plenty of vegetables, fruits, low-fat dairy products, and lean protein.  Do not eat a lot of foods high in solid fats, added sugars, or salt.  Get regular exercise. This is one of the most important things you can do for your health.  Most adults should exercise for at least 150 minutes each week. The exercise should increase your heart rate and make you sweat (moderate-intensity exercise).  Most adults should also do strengthening exercises at least twice a week. This is in addition to the moderate-intensity exercise.  Maintain a healthy weight  Body mass index (BMI) is a measurement that can be used to identify possible weight problems. It estimates body fat based on height and weight. Your health care provider can help determine your BMI and help you achieve or maintain a healthy weight.  For females 28 years of age and older:   A BMI below 18.5 is considered underweight.  A BMI of 18.5 to 24.9 is normal.  A BMI of 25 to 29.9 is considered overweight.  A  BMI of 30 and above is considered obese.  Watch levels of cholesterol and blood lipids  You should start having your blood tested for lipids and cholesterol at 67 years of age, then have this test every 5 years.  You may need to have your cholesterol levels checked more often if:  Your lipid or cholesterol levels are high.  You are older than 67 years of age.  You are at high risk for heart disease.  CANCER SCREENING   Lung Cancer  Lung cancer screening is recommended for adults 90-18 years old who are at high risk for lung cancer because of a history of smoking.  A yearly low-dose CT scan of the lungs is recommended for people who:  Currently smoke.  Have quit within the past 15 years.  Have at least a 30-pack-year history of smoking. A pack year is smoking an average of one pack of cigarettes a day for 1 year.  Yearly screening should continue until it has been 15 years since you quit.  Yearly screening should stop if you develop a health problem that would prevent you from having lung cancer treatment.  Breast Cancer  Practice breast self-awareness. This means understanding how your breasts normally appear and feel.  It also means doing regular breast self-exams. Let your health care provider know about any changes, no matter how small.  If you are in your 20s or 30s, you should have  a clinical breast exam (CBE) by a health care provider every 1-3 years as part of a regular health exam.  If you are 63 or older, have a CBE every year. Also consider having a breast X-ray (mammogram) every year.  If you have a family history of breast cancer, talk to your health care provider about genetic screening.  If you are at high risk for breast cancer, talk to your health care provider about having an MRI and a mammogram every year.  Breast cancer gene (BRCA) assessment is recommended for women who have family members with BRCA-related cancers. BRCA-related cancers  include:  Breast.  Ovarian.  Tubal.  Peritoneal cancers.  Results of the assessment will determine the need for genetic counseling and BRCA1 and BRCA2 testing. Cervical Cancer Routine pelvic examinations to screen for cervical cancer are no longer recommended for nonpregnant women who are considered low risk for cancer of the pelvic organs (ovaries, uterus, and vagina) and who do not have symptoms. A pelvic examination may be necessary if you have symptoms including those associated with pelvic infections. Ask your health care provider if a screening pelvic exam is right for you.   The Pap test is the screening test for cervical cancer for women who are considered at risk.  If you had a hysterectomy for a problem that was not cancer or a condition that could lead to cancer, then you no longer need Pap tests.  If you are older than 65 years, and you have had normal Pap tests for the past 10 years, you no longer need to have Pap tests.  If you have had past treatment for cervical cancer or a condition that could lead to cancer, you need Pap tests and screening for cancer for at least 20 years after your treatment.  If you no longer get a Pap test, assess your risk factors if they change (such as having a new sexual partner). This can affect whether you should start being screened again.  Some women have medical problems that increase their chance of getting cervical cancer. If this is the case for you, your health care provider may recommend more frequent screening and Pap tests.  The human papillomavirus (HPV) test is another test that may be used for cervical cancer screening. The HPV test looks for the virus that can cause cell changes in the cervix. The cells collected during the Pap test can be tested for HPV.  The HPV test can be used to screen women 64 years of age and older. Getting tested for HPV can extend the interval between normal Pap tests from three to five years.  An HPV  test also should be used to screen women of any age who have unclear Pap test results.  After 67 years of age, women should have HPV testing as often as Pap tests.  Colorectal Cancer  This type of cancer can be detected and often prevented.  Routine colorectal cancer screening usually begins at 67 years of age and continues through 67 years of age.  Your health care provider may recommend screening at an earlier age if you have risk factors for colon cancer.  Your health care provider may also recommend using home test kits to check for hidden blood in the stool.  A small camera at the end of a tube can be used to examine your colon directly (sigmoidoscopy or colonoscopy). This is done to check for the earliest forms of colorectal cancer.  Routine screening usually begins  at age 50.  Direct examination of the colon should be repeated every 5-10 years through 67 years of age. However, you may need to be screened more often if early forms of precancerous polyps or small growths are found. Skin Cancer  Check your skin from head to toe regularly.  Tell your health care provider about any new moles or changes in moles, especially if there is a change in a mole's shape or color.  Also tell your health care provider if you have a mole that is larger than the size of a pencil eraser.  Always use sunscreen. Apply sunscreen liberally and repeatedly throughout the day.  Protect yourself by wearing long sleeves, pants, a wide-brimmed hat, and sunglasses whenever you are outside. HEART DISEASE, DIABETES, AND HIGH BLOOD PRESSURE   Have your blood pressure checked at least every 1-2 years. High blood pressure causes heart disease and increases the risk of stroke.  If you are between 55 years and 79 years old, ask your health care provider if you should take aspirin to prevent strokes.  Have regular diabetes screenings. This involves taking a blood sample to check your fasting blood sugar  level.  If you are at a normal weight and have a low risk for diabetes, have this test once every three years after 67 years of age.  If you are overweight and have a high risk for diabetes, consider being tested at a younger age or more often. PREVENTING INFECTION  Hepatitis B  If you have a higher risk for hepatitis B, you should be screened for this virus. You are considered at high risk for hepatitis B if:  You were born in a country where hepatitis B is common. Ask your health care provider which countries are considered high risk.  Your parents were born in a high-risk country, and you have not been immunized against hepatitis B (hepatitis B vaccine).  You have HIV or AIDS.  You use needles to inject street drugs.  You live with someone who has hepatitis B.  You have had sex with someone who has hepatitis B.  You get hemodialysis treatment.  You take certain medicines for conditions, including cancer, organ transplantation, and autoimmune conditions. Hepatitis C  Blood testing is recommended for:  Everyone born from 1945 through 1965.  Anyone with known risk factors for hepatitis C. Sexually transmitted infections (STIs)  You should be screened for sexually transmitted infections (STIs) including gonorrhea and chlamydia if:  You are sexually active and are younger than 67 years of age.  You are older than 67 years of age and your health care provider tells you that you are at risk for this type of infection.  Your sexual activity has changed since you were last screened and you are at an increased risk for chlamydia or gonorrhea. Ask your health care provider if you are at risk.  If you do not have HIV, but are at risk, it may be recommended that you take a prescription medicine daily to prevent HIV infection. This is called pre-exposure prophylaxis (PrEP). You are considered at risk if:  You are sexually active and do not regularly use condoms or know the HIV status  of your partner(s).  You take drugs by injection.  You are sexually active with a partner who has HIV. Talk with your health care provider about whether you are at high risk of being infected with HIV. If you choose to begin PrEP, you should first be tested for HIV.   You should then be tested every 3 months for as long as you are taking PrEP.  PREGNANCY   If you are premenopausal and you may become pregnant, ask your health care provider about preconception counseling.  If you may become pregnant, take 400 to 800 micrograms (mcg) of folic acid every day.  If you want to prevent pregnancy, talk to your health care provider about birth control (contraception). OSTEOPOROSIS AND MENOPAUSE   Osteoporosis is a disease in which the bones lose minerals and strength with aging. This can result in serious bone fractures. Your risk for osteoporosis can be identified using a bone density scan.  If you are 56 years of age or older, or if you are at risk for osteoporosis and fractures, ask your health care provider if you should be screened.  Ask your health care provider whether you should take a calcium or vitamin D supplement to lower your risk for osteoporosis.  Menopause may have certain physical symptoms and risks.  Hormone replacement therapy may reduce some of these symptoms and risks. Talk to your health care provider about whether hormone replacement therapy is right for you.  HOME CARE INSTRUCTIONS   Schedule regular health, dental, and eye exams.  Stay current with your immunizations.   Do not use any tobacco products including cigarettes, chewing tobacco, or electronic cigarettes.  If you are pregnant, do not drink alcohol.  If you are breastfeeding, limit how much and how often you drink alcohol.  Limit alcohol intake to no more than 1 drink per day for nonpregnant women. One drink equals 12 ounces of beer, 5 ounces of wine, or 1 ounces of hard liquor.  Do not use street  drugs.  Do not share needles.  Ask your health care provider for help if you need support or information about quitting drugs.  Tell your health care provider if you often feel depressed.  Tell your health care provider if you have ever been abused or do not feel safe at home. Document Released: 10/02/2010 Document Revised: 08/03/2013 Document Reviewed: 02/18/2013 [facility] Patient Information 2015 [place], [place]. This information is not intended to replace advice given to you by your health care provider. Make sure you discuss any questions you have with your health care provider.

## 2014-11-20 ENCOUNTER — Encounter: Payer: Self-pay | Admitting: Internal Medicine

## 2014-11-20 NOTE — Assessment & Plan Note (Signed)
Review of recent labs indicate no need for change. Continue synthroid 125 mcg daily.

## 2014-11-20 NOTE — Assessment & Plan Note (Signed)
Taking lipitor 20 mg daily. Continue, recent lipid panel at goal. No side effects.

## 2014-11-20 NOTE — Assessment & Plan Note (Signed)
We talked about diet and exercise as the only options to help with this. She is working on losing weight and I encouraged that. Reviewed recent labs and LFTs are normal.

## 2014-11-20 NOTE — Progress Notes (Signed)
   Subjective:    Patient ID: Allison [name], female    DOB: Feb 13, 1946, 69 y.o.   MRN: [id number]  HPI The patient is a 69 YO female who is coming in new about her blood pressure. She has struggled with it for many years. Her medications have been stable for some time but her control is not great. Sometimes is it okay but she has been having more high blood pressures in the last 6 months. She is still taking her medication the same. Has been taking some more ibuprofen for some joint pains. She is also concerned about her prior diagnosis of fatty liver disease and wants to know what she can do about it. Denies headaches, SOB, chest pains, nausea, confusion.  PMH, [facility], social history reviewed and updated.   Review of Systems  Constitutional: Negative for fever, activity change, appetite change, fatigue and unexpected weight change.  HENT: Negative.   Eyes: Negative.   Respiratory: Negative for cough, chest tightness, shortness of breath and wheezing.   Cardiovascular: Negative for chest pain, palpitations and leg swelling.  Gastrointestinal: Negative.   Musculoskeletal: Negative.   Skin: Negative.   Neurological: Negative for dizziness, weakness, numbness and headaches.  Psychiatric/Behavioral: Negative.       Objective:   Physical Exam  Constitutional: She is oriented to person, place, and time. She appears well-developed and well-nourished.  HENT:  Head: Normocephalic and atraumatic.  Eyes: EOM are normal.  Neck: Normal range of motion.  Cardiovascular: Normal rate and regular rhythm.   No murmur heard. Pulmonary/Chest: Effort normal and breath sounds normal. No respiratory distress. She has no wheezes. She has no rales.  Abdominal: Soft. Bowel sounds are normal. She exhibits no distension. There is no tenderness. There is no rebound.  Musculoskeletal: She exhibits no edema.  Neurological: She is alert and oriented to person, place, and time. Coordination normal.  Skin: Skin is  warm and dry.  Psychiatric: She has a normal mood and affect.   Filed Vitals:   11/19/14 0902 11/19/14 0931  BP: 162/90 138/62  Pulse: 54   Temp: 98.6 F (37 C)   TempSrc: Oral   Resp: 14   Height: 5\' 2"  (1.575 m)   Weight: 174 lb 1.9 oz (78.98 kg)   SpO2: 98%       Assessment & Plan:

## 2014-11-20 NOTE — Assessment & Plan Note (Signed)
Review of her recent blood pressures do indicate that her pressures have been more elevated in the last few months. Advised her to try using tylenol for joint pain instead to help with her pressures. She is also working on losing weight which will help. Continue lasix 80 mg daily for now. Reviewed recent BMP and no indication for change. BP at goal today.

## 2014-11-24 ENCOUNTER — Telehealth: Payer: Self-pay | Admitting: *Deleted

## 2014-11-24 DIAGNOSIS — M65331 Trigger finger, right middle finger: Secondary | ICD-10-CM | POA: Diagnosis not present

## 2014-11-24 DIAGNOSIS — M79641 Pain in right hand: Secondary | ICD-10-CM | POA: Diagnosis not present

## 2014-11-24 MED ORDER — ATORVASTATIN CALCIUM 20 MG PO TABS: 20.0000 mg | ORAL_TABLET | Freq: Every day | ORAL | Status: DC

## 2014-11-24 NOTE — Telephone Encounter (Signed)
Pt left msg on triage stating she no longer see previous md who rx atorvastatin requesting new rx to be sent to [facility] pt back no answer LMOM rx has been fax to [place]...../LMB

## 2015-01-05 DIAGNOSIS — M65331 Trigger finger, right middle finger: Secondary | ICD-10-CM | POA: Diagnosis not present

## 2015-01-05 DIAGNOSIS — M79641 Pain in right hand: Secondary | ICD-10-CM | POA: Diagnosis not present

## 2015-01-14 ENCOUNTER — Telehealth: Payer: Self-pay | Admitting: *Deleted

## 2015-01-14 MED ORDER — FUROSEMIDE 80 MG PO TABS: 80.0000 mg | ORAL_TABLET | Freq: Every day | ORAL | Status: DC

## 2015-01-14 NOTE — Telephone Encounter (Signed)
Receive call pt states she is needing new rx on her furosemide sent to [facility]. Inform pt sending electronically...Johny Chess

## 2015-02-03 ENCOUNTER — Ambulatory Visit: Payer: Medicare Other

## 2015-02-03 DIAGNOSIS — Z23 Encounter for immunization: Secondary | ICD-10-CM

## 2015-03-16 ENCOUNTER — Encounter: Payer: Self-pay | Admitting: Internal Medicine

## 2015-03-30 ENCOUNTER — Ambulatory Visit (INDEPENDENT_AMBULATORY_CARE_PROVIDER_SITE_OTHER): Payer: Medicare Other | Admitting: Family Medicine

## 2015-03-30 ENCOUNTER — Encounter: Payer: Self-pay | Admitting: Family Medicine

## 2015-03-30 VITALS — BP 130/80 | HR 62 | Temp 98.1°F | Ht 63.0 in | Wt 177.8 lb

## 2015-03-30 DIAGNOSIS — J209 Acute bronchitis, unspecified: Secondary | ICD-10-CM | POA: Diagnosis not present

## 2015-03-30 MED ORDER — AZITHROMYCIN 250 MG PO TABS: ORAL_TABLET | ORAL | Status: DC

## 2015-03-30 MED ORDER — PREDNISONE 10 MG PO TABS: ORAL_TABLET | ORAL | Status: DC

## 2015-03-30 MED ORDER — HYDROCOD POLST-CPM POLST ER 10-8 MG/5ML PO SUER: 5.0000 mL | Freq: Two times a day (BID) | ORAL | Status: DC | PRN

## 2015-03-30 NOTE — Patient Instructions (Signed)
Take the zithromax as directed  Drink lots of fluids Get rest  tussionex for cough when not working or driving  Take the prednisone as directed    Update if not starting to improve in a week or if worsening

## 2015-03-30 NOTE — Progress Notes (Signed)
Pre visit review using our clinic review tool, if applicable. No additional management support is needed unless otherwise documented below in the visit note. 

## 2015-03-30 NOTE — Progress Notes (Signed)
Subjective:    Patient ID: Allison [name], female    DOB: 1945-09-03, 69 y.o.   MRN: [id number]  HPI Here with uri symptoms    She was at [place] last week with her partner - may have come in contact with a virus   Started Sunday night - vit C and zinc did not help  Stuffy head and runny nose and itchy eyes  Coughing  Throat was very sore last night - from coughing  Prod of phlegm - unsure what color it was  Was wheezing last night - not as much today   Did not check temp  Had some chills over the wkend -not now   One dose of alka selzer cold-no help    Patient Active Problem List   Diagnosis Date Noted  . HTN (hypertension) 03/04/2013  . Osteopenia 03/04/2013  . Dyspnea 01/20/2013  . Bronchospasm, acute 11/22/2011  . Urticaria 11/22/2011  . Family history of colon cancer 09/09/2011  . Obesity 09/09/2011  . Hypokalemia 06/18/2011  . Hyperlipidemia   . Thyroid disease   . Fatty liver disease, nonalcoholic   . DJD (degenerative joint disease) of hip    Past Medical History  Diagnosis Date  . Diverticulosis   . Hyperlipidemia   . Thyroid disease     hypo  . Elevated serum alkaline phosphatase level   . Fatty liver disease, nonalcoholic   . Obesity   . DJD (degenerative joint disease) of hip   . DJD (degenerative joint disease) of hip     s/P R hip replacement  . Internal hemorrhoids    Past Surgical History  Procedure Laterality Date  . Rotator cuff repair  1996, 1998, 2008  . Joint replacement  2004    R hip  . Toe fusion  2000's  . Dental surgery     Social History  Substance Use Topics  . Smoking status: Former Smoker -- 2.00 packs/day for 20 years    Types: Cigarettes    Quit date: 04/03/1983  . Smokeless tobacco: Never Used  . Alcohol Use: 3.5 oz/week    7 Standard drinks or equivalent per week     Comment: 0-2 daily   Family History  Problem Relation Age of Onset  . Cancer Mother 66    colon  . Heart disease Father   . HIV  Brother    No Known Allergies Current Outpatient Prescriptions on File Prior to Visit  Medication Sig Dispense Refill  . albuterol (PROVENTIL HFA;VENTOLIN HFA) 108 (90 BASE) MCG/ACT inhaler Take 2 inhalations tid as needed 1 Inhaler 1  . atorvastatin (LIPITOR) 20 MG tablet Take 1 tablet (20 mg total) by mouth daily. 90 tablet 3  . Calcium Carbonate-Vit D-Min (CALCIUM 1200 PO) Take 1 tablet by mouth daily.      . Cholecalciferol (VITAMIN D3) 5000 UNITS TABS Take 2,000 Units by mouth daily.     . furosemide (LASIX) 80 MG tablet Take 1 tablet (80 mg total) by mouth daily. 90 tablet 3  . levothyroxine (SYNTHROID, LEVOTHROID) 125 MCG tablet Take as directed 90 tablet 3  . potassium chloride (MICRO-K) 10 MEQ CR capsule Take 2 capsules (20 mEq total) by mouth daily. 180 capsule 3  . RABEprazole (ACIPHEX) 20 MG tablet Take 1 tablet (20 mg total) by mouth daily. 90 tablet 3   No current facility-administered medications on file prior to visit.      Review of Systems Review of Systems  Constitutional: Negative for  fever, appetite change,  and unexpected weight change.  ENT pos for cong an drhinorrhea and st Eyes: Negative for pain and visual disturbance.  Respiratory: Negative for  shortness of breath.  pos for cough and wheeze  Cardiovascular: Negative for cp or palpitations    Gastrointestinal: Negative for nausea, diarrhea and constipation.  Genitourinary: Negative for urgency and frequency.  Skin: Negative for pallor or rash   Neurological: Negative for weakness, light-headedness, numbness and headaches.  Hematological: Negative for adenopathy. Does not bruise/bleed easily.  Psychiatric/Behavioral: Negative for dysphoric mood. The patient is not nervous/anxious.         Objective:   Physical Exam  Constitutional: She appears well-developed and well-nourished. No distress.  obese and well appearing   HENT:  Head: Normocephalic and atraumatic.  Right Ear: External ear normal.  Left  Ear: External ear normal.  Mouth/Throat: Oropharynx is clear and moist.  Nares are injected and congested  No sinus tenderness Clear rhinorrhea and post nasal drip   Eyes: Conjunctivae and EOM are normal. Pupils are equal, round, and reactive to light. Right eye exhibits no discharge. Left eye exhibits no discharge.  Neck: Normal range of motion. Neck supple.  Cardiovascular: Normal rate and normal heart sounds.   Pulmonary/Chest: Effort normal. No respiratory distress. She has wheezes. She has no rales. She exhibits no tenderness.  Harsh bs with diffuse exp wheezes Nl exp phase time  No rales Few rhonchi   Lymphadenopathy:    She has no cervical adenopathy.  Neurological: She is alert.  Skin: Skin is warm and dry. No rash noted.  Psychiatric: She has a normal mood and affect.          Assessment & Plan:   Problem List Items Addressed This Visit      Respiratory   Acute bronchitis - Primary    With bronchospasm and exp to pneumonia in a hospital Take the zithromax as directed  Drink lots of fluids Get rest  tussionex for cough when not working or driving  Take the prednisone as directed - 30 mg taper  Disc symptomatic care - see instructions on AVS  Update if not starting to improve in a week or if worsening

## 2015-03-31 ENCOUNTER — Ambulatory Visit: Payer: Medicare Other | Admitting: Family

## 2015-03-31 NOTE — Assessment & Plan Note (Signed)
With bronchospasm and exp to pneumonia in a hospital Take the zithromax as directed  Drink lots of fluids Get rest  tussionex for cough when not working or driving  Take the prednisone as directed - 30 mg taper  Disc symptomatic care - see instructions on AVS  Update if not starting to improve in a week or if worsening

## 2015-05-04 DIAGNOSIS — I10 Essential (primary) hypertension: Secondary | ICD-10-CM | POA: Diagnosis not present

## 2015-05-04 DIAGNOSIS — E559 Vitamin D deficiency, unspecified: Secondary | ICD-10-CM | POA: Diagnosis not present

## 2015-05-04 DIAGNOSIS — E039 Hypothyroidism, unspecified: Secondary | ICD-10-CM | POA: Diagnosis not present

## 2015-05-04 DIAGNOSIS — J45909 Unspecified asthma, uncomplicated: Secondary | ICD-10-CM | POA: Diagnosis not present

## 2015-05-04 DIAGNOSIS — E7800 Pure hypercholesterolemia, unspecified: Secondary | ICD-10-CM | POA: Diagnosis not present

## 2015-05-27 ENCOUNTER — Ambulatory Visit: Payer: Medicare Other | Admitting: Internal Medicine

## 2015-08-04 ENCOUNTER — Other Ambulatory Visit: Payer: Self-pay | Admitting: Internal Medicine

## 2015-10-10 ENCOUNTER — Other Ambulatory Visit: Payer: Self-pay | Admitting: Internal Medicine

## 2015-10-24 ENCOUNTER — Other Ambulatory Visit: Payer: Self-pay | Admitting: Internal Medicine

## 2015-10-24 DIAGNOSIS — Z1231 Encounter for screening mammogram for malignant neoplasm of breast: Secondary | ICD-10-CM

## 2015-10-26 ENCOUNTER — Encounter: Payer: Self-pay | Admitting: Internal Medicine

## 2015-11-09 ENCOUNTER — Ambulatory Visit
Admission: RE | Admit: 2015-11-09 | Discharge: 2015-11-09 | Disposition: A | Discharge status: Home / Self Care | Payer: Medicare Other | Source: Ambulatory Visit | Attending: Internal Medicine | Admitting: Internal Medicine

## 2015-11-09 DIAGNOSIS — Z1231 Encounter for screening mammogram for malignant neoplasm of breast: Secondary | ICD-10-CM

## 2015-11-28 DIAGNOSIS — H6121 Impacted cerumen, right ear: Secondary | ICD-10-CM | POA: Diagnosis not present

## 2015-11-28 DIAGNOSIS — Z0000 Encounter for general adult medical examination without abnormal findings: Secondary | ICD-10-CM | POA: Diagnosis not present

## 2015-12-28 ENCOUNTER — Ambulatory Visit (AMBULATORY_SURGERY_CENTER): Payer: Self-pay | Admitting: *Deleted

## 2015-12-28 VITALS — Ht 64.0 in | Wt 186.0 lb

## 2015-12-28 DIAGNOSIS — Z800 Family history of malignant neoplasm of digestive organs: Secondary | ICD-10-CM

## 2015-12-28 MED ORDER — NA SULFATE-K SULFATE-MG SULF 17.5-3.13-1.6 GM/177ML PO SOLN: ORAL | 0 refills | Status: DC

## 2015-12-28 NOTE — Progress Notes (Signed)
Patient states she gets nausea after surgeries and they give her IV medications for this before the surgeries.

## 2015-12-28 NOTE — Progress Notes (Signed)
Patient denies any allergies to eggs or soy. Patient denies any problems with anesthesia/sedation. Patient denies any oxygen use at home and does not take any diet/weight loss medications. Pt declined EMMI education. 

## 2015-12-29 ENCOUNTER — Other Ambulatory Visit (HOSPITAL_COMMUNITY)
Admission: RE | Admit: 2015-12-29 | Discharge: 2015-12-29 | Disposition: A | Discharge status: Home / Self Care | Payer: Medicare Other | Source: Ambulatory Visit | Attending: Internal Medicine | Admitting: Internal Medicine

## 2015-12-29 ENCOUNTER — Other Ambulatory Visit: Payer: Self-pay | Admitting: Internal Medicine

## 2015-12-29 DIAGNOSIS — Z1151 Encounter for screening for human papillomavirus (HPV): Secondary | ICD-10-CM | POA: Insufficient documentation

## 2015-12-29 DIAGNOSIS — Z23 Encounter for immunization: Secondary | ICD-10-CM | POA: Diagnosis not present

## 2015-12-29 DIAGNOSIS — Z01419 Encounter for gynecological examination (general) (routine) without abnormal findings: Secondary | ICD-10-CM | POA: Insufficient documentation

## 2015-12-29 DIAGNOSIS — H6123 Impacted cerumen, bilateral: Secondary | ICD-10-CM | POA: Diagnosis not present

## 2015-12-29 DIAGNOSIS — M8588 Other specified disorders of bone density and structure, other site: Secondary | ICD-10-CM | POA: Diagnosis not present

## 2015-12-29 DIAGNOSIS — E2839 Other primary ovarian failure: Secondary | ICD-10-CM | POA: Diagnosis not present

## 2016-01-02 LAB — CYTOLOGY - PAP

## 2016-01-10 ENCOUNTER — Ambulatory Visit (AMBULATORY_SURGERY_CENTER): Payer: Medicare Other | Admitting: Internal Medicine

## 2016-01-10 ENCOUNTER — Encounter: Payer: Self-pay | Admitting: Internal Medicine

## 2016-01-10 VITALS — BP 95/47 | HR 48 | Temp 98.0°F | Resp 13 | Ht 64.0 in | Wt 186.0 lb

## 2016-01-10 DIAGNOSIS — Z1212 Encounter for screening for malignant neoplasm of rectum: Secondary | ICD-10-CM | POA: Diagnosis not present

## 2016-01-10 DIAGNOSIS — Z1211 Encounter for screening for malignant neoplasm of colon: Secondary | ICD-10-CM

## 2016-01-10 DIAGNOSIS — Z800 Family history of malignant neoplasm of digestive organs: Secondary | ICD-10-CM | POA: Diagnosis not present

## 2016-01-10 DIAGNOSIS — D122 Benign neoplasm of ascending colon: Secondary | ICD-10-CM

## 2016-01-10 MED ORDER — SODIUM CHLORIDE 0.9 % IV SOLN: 500.0000 mL | INTRAVENOUS | Status: DC

## 2016-01-10 NOTE — Op Note (Signed)
[place] Patient Name: Allison [name] Procedure Date: 01/10/2016 7:43 AM MRN: [id number] Endoscopist: Docia Chuck. Henrene Pastor , MD Age: 70 Referring MD:  Date of Birth: 1946/02/16 Gender: Female Account #: [id number] Procedure:                Colonoscopy, with cold snare polypectomy X2 Indications:              Screening patient at increased risk: Colorectal                            cancer in multiple 1st-degree relatives before age                            19 years. Previous screening examinations 2005 and                            2011 were negative for neoplasia Medicines:                Monitored Anesthesia Care Procedure:                Pre-Anesthesia Assessment:                           - Prior to the procedure, a History and Physical                            was performed, and patient medications and                            allergies were reviewed. The patient's tolerance of                            previous anesthesia was also reviewed. The risks                            and benefits of the procedure and the sedation                            options and risks were discussed with the patient.                            All questions were answered, and informed consent                            was obtained. Prior Anticoagulants: The patient has                            taken no previous anticoagulant or antiplatelet                            agents. ASA Grade Assessment: II - A patient with                            mild systemic disease. After reviewing the risks  and benefits, the patient was deemed in                            satisfactory condition to undergo the procedure.                           After obtaining informed consent, the colonoscope                            was passed under direct vision. Throughout the                            procedure, the patient's blood pressure, pulse, and        oxygen saturations were monitored continuously. The                            Model CF-HQ190L [id number]) scope was introduced                            through the anus and advanced to the the cecum,                            identified by appendiceal orifice and ileocecal                            valve. The ileocecal valve, appendiceal orifice,                            and rectum were photographed. The quality of the                            bowel preparation was excellent. The colonoscopy                            was performed without difficulty. The patient                            tolerated the procedure well. The bowel preparation                            used was SUPREP. Scope In: 8:22:05 AM Scope Out: 8:37:07 AM Scope Withdrawal Time: 0 hours 11 minutes 50 seconds  Total Procedure Duration: 0 hours 15 minutes 2 seconds  Findings:                 Two polyps were found in the ascending colon. The                            polyps were 2 mm in size. These polyps were removed                            with a cold snare. Resection and retrieval were  complete.                           Multiple small and large-mouthed diverticula were                            found in the left colon.                           Internal hemorrhoids were found during retroflexion.                           The exam was otherwise without abnormality on                            direct and retroflexion views. Complications:            No immediate complications. Estimated blood loss:                            None. Estimated Blood Loss:     Estimated blood loss: none. Impression:               - Two 2 mm polyps in the ascending colon, removed                            with a cold snare. Resected and retrieved.                           - Diverticulosis in the left colon.                           - Internal hemorrhoids.                           - The  examination was otherwise normal on direct                            and retroflexion views. Recommendation:           - Repeat colonoscopy in 5 years for surveillance.                           - Patient has a contact number available for                            emergencies. The signs and symptoms of potential                            delayed complications were discussed with the                            patient. Return to normal activities tomorrow.                            Written discharge instructions were provided to the  patient.                           - Resume previous diet.                           - Continue present medications.                           - Await pathology results. Docia Chuck. Henrene Pastor, MD 01/10/2016 8:47:46 AM This report has been signed electronically.

## 2016-01-10 NOTE — Patient Instructions (Signed)
YOU HAD AN ENDOSCOPIC PROCEDURE TODAY AT [facility]:   Refer to the procedure report that was given to you for any specific questions about what was found during the examination.  If the procedure report does not answer your questions, please call your gastroenterologist to clarify.  If you requested that your care partner not be given the details of your procedure findings, then the procedure report has been included in a sealed envelope for you to review at your convenience later.  YOU SHOULD EXPECT: Some feelings of bloating in the abdomen. Passage of more gas than usual.  Walking can help get rid of the air that was put into your GI tract during the procedure and reduce the bloating. If you had a lower endoscopy (such as a colonoscopy or flexible sigmoidoscopy) you may notice spotting of blood in your stool or on the toilet paper. If you underwent a bowel prep for your procedure, you may not have a normal bowel movement for a few days.  Please Note:  You might notice some irritation and congestion in your nose or some drainage.  This is from the oxygen used during your procedure.  There is no need for concern and it should clear up in a day or so.  SYMPTOMS TO REPORT IMMEDIATELY:   Following lower endoscopy (colonoscopy or flexible sigmoidoscopy):  Excessive amounts of blood in the stool  Significant tenderness or worsening of abdominal pains  Swelling of the abdomen that is new, acute  Fever of 100F or higher   Following upper endoscopy (EGD)  Vomiting of blood or coffee ground material  New chest pain or pain under the shoulder blades  Painful or persistently difficult swallowing  New shortness of breath  Fever of 100F or higher  Black, tarry-looking stools  For urgent or emergent issues, a gastroenterologist can be reached at any hour by calling [phone].   DIET:  We do recommend a small meal at first, but then you may proceed to your regular diet.  Drink  plenty of fluids but you should avoid alcoholic beverages for 24 hours.  ACTIVITY:  You should plan to take it easy for the rest of today and you should NOT DRIVE or use heavy machinery until tomorrow (because of the sedation medicines used during the test).    FOLLOW UP: Our staff will call the number listed on your records the next business day following your procedure to check on you and address any questions or concerns that you may have regarding the information given to you following your procedure. If we do not reach you, we will leave a message.  However, if you are feeling well and you are not experiencing any problems, there is no need to return our call.  We will assume that you have returned to your regular daily activities without incident.  If any biopsies were taken you will be contacted by phone or by letter within the next 1-3 weeks.  Please call us at [phone] if you have not heard about the biopsies in 3 weeks.    SIGNATURES/CONFIDENTIALITY: You and/or your care partner have signed paperwork which will be entered into your electronic medical record.  These signatures attest to the fact that that the information above on your After Visit Summary has been reviewed and is understood.  Full responsibility of the confidentiality of this discharge information lies with you and/or your care-partner.   Handouts were given to your care partner on polyps, diverticulosis,  hemorrhoids, and a high fiber diet with liberal fluid intake. You may resume your current medications today. Await biopsy results. Please call if any questions or concerns.   

## 2016-01-10 NOTE — Progress Notes (Signed)
Spontaneous respirations throughout. VSS. Resting comfortably. To PACU on room air. Report to  Annette RN.  

## 2016-01-10 NOTE — Progress Notes (Signed)
Called to room to assist during endoscopic procedure.  Patient ID and intended procedure confirmed with present staff. Received instructions for my participation in the procedure from the performing physician.  

## 2016-01-10 NOTE — Progress Notes (Signed)
No problems noted in the recovery room. maw 

## 2016-01-11 ENCOUNTER — Telehealth: Payer: Self-pay

## 2016-01-11 NOTE — Telephone Encounter (Signed)
  Follow up Call-  Call back number 01/10/2016  Post procedure Call Back phone  # [phone]  Permission to leave phone message Yes  Some recent data might be hidden     Patient questions:  Do you have a fever, pain , or abdominal swelling? No. Pain Score  0 *  Have you tolerated food without any problems? Yes.    Have you been able to return to your normal activities? Yes.    Do you have any questions about your discharge instructions: Diet   No. Medications  No. Follow up visit  No.  Do you have questions or concerns about your Care? No.  Actions: * If pain score is 4 or above: No action needed, pain <4.  No problems per the pt. maw

## 2016-01-13 ENCOUNTER — Encounter: Payer: Self-pay | Admitting: Internal Medicine

## 2016-03-28 ENCOUNTER — Ambulatory Visit (INDEPENDENT_AMBULATORY_CARE_PROVIDER_SITE_OTHER): Payer: Medicare Other

## 2016-03-28 ENCOUNTER — Encounter (INDEPENDENT_AMBULATORY_CARE_PROVIDER_SITE_OTHER): Payer: Self-pay | Admitting: Orthopedic Surgery

## 2016-03-28 ENCOUNTER — Ambulatory Visit (INDEPENDENT_AMBULATORY_CARE_PROVIDER_SITE_OTHER): Payer: Medicare Other | Admitting: Orthopedic Surgery

## 2016-03-28 ENCOUNTER — Other Ambulatory Visit (INDEPENDENT_AMBULATORY_CARE_PROVIDER_SITE_OTHER): Payer: Self-pay

## 2016-03-28 VITALS — BP 150/74 | HR 57 | Resp 14 | Ht 64.0 in | Wt 178.0 lb

## 2016-03-28 DIAGNOSIS — M25561 Pain in right knee: Secondary | ICD-10-CM

## 2016-03-28 DIAGNOSIS — G8929 Other chronic pain: Secondary | ICD-10-CM

## 2016-03-28 DIAGNOSIS — M11261 Other chondrocalcinosis, right knee: Secondary | ICD-10-CM

## 2016-03-28 DIAGNOSIS — M1711 Unilateral primary osteoarthritis, right knee: Secondary | ICD-10-CM | POA: Diagnosis not present

## 2016-03-28 MED ORDER — BUPIVACAINE HCL 0.5 % IJ SOLN
3.0000 mL | INTRAMUSCULAR | Status: AC | PRN
  Administered 2016-03-28: 3 mL via INTRA_ARTICULAR

## 2016-03-28 MED ORDER — LIDOCAINE HCL 1 % IJ SOLN
5.0000 mL | INTRAMUSCULAR | Status: AC | PRN
  Administered 2016-03-28: 5 mL

## 2016-03-28 MED ORDER — METHYLPREDNISOLONE ACETATE 40 MG/ML IJ SUSP
80.0000 mg | INTRAMUSCULAR | Status: AC | PRN
  Administered 2016-03-28: 80 mg

## 2016-03-28 NOTE — Progress Notes (Signed)
Office Visit Note   Patient: Allison [name]           Date of Birth: 1945-04-05           MRN: [id number] Visit Date: 03/28/2016              Requested by: Lanice Shirts, MD [address] [place], [place] [address] PCP: Kelton Pillar, MD   Assessment & Plan: Visit Diagnoses:  1. Unilateral primary osteoarthritis, right knee   2. Chronic pain of right knee   3. Chondrocalcinosis of right knee     Plan:  #1: Aspiration and corticosteroid injection to the right kneeWith fluid being sent for analysis #2: Follow up as needed  Follow-Up Instructions: Return if symptoms worsen or fail to improve.   Orders:  Orders Placed This Encounter  Procedures  . Large Joint Injection/Arthrocentesis  . Body Fluid Culture  . XR Knee Complete 4 Views Right  . Cell count + diff,  w/ cryst-synvl fld   No orders of the defined types were placed in this encounter.     Procedures: Large Joint Inj Date/Time: 03/28/2016 5:29 PM Performed by: Biagio Borg D Authorized by: Biagio Borg D   Consent Given by:  Patient Timeout: prior to procedure the correct patient, procedure, and site was verified   Indications:  Pain and joint swelling Location:  Knee Site:  R knee Prep: patient was prepped and draped in usual sterile fashion   Needle Size:  25 G Needle Length:  1.5 inches Approach:  Anteromedial Ultrasound Guidance: No   Fluoroscopic Guidance: No   Arthrogram: No   Medications:  3 mL bupivacaine 0.5 %; 5 mL lidocaine 1 %; 80 mg methylPREDNISolone acetate 40 MG/ML Aspiration Attempted: Yes   Aspirate amount (mL):  10 Aspirate:  Blood-tinged Patient tolerance:  Patient tolerated the procedure well with no immediate complications      Clinical Data: No additional findings.   Subjective: Chief Complaint  Patient presents with  . Right Knee - Pain, Edema    Pain in Right knee starting 2 days ago denies history of injury or trauma and states her  knee has been hurting off for years. Had a previous corticosteroid injection in 2012. Swelling sometimes, takes no meds. She has a vacation coming up this week and was concerned about the pain and is requested evaluation and possible corticosteroid injection.    Review of Systems  Constitutional: Negative.   HENT: Negative.   Eyes: Negative.   Respiratory: Negative.   Cardiovascular: Negative.   Gastrointestinal:       History of acid reflux/GERD  Endocrine:       History of hypothyroidism  Genitourinary: Negative.   Musculoskeletal: Negative.   Skin: Negative.   Allergic/Immunologic: Negative.   Neurological: Negative.   Hematological: Negative.   Psychiatric/Behavioral: Negative.      Objective: Vital Signs: BP (!) 150/74   Pulse (!) 57   Resp 14   Ht 5\' 4"  (1.626 m)   Wt 178 lb (80.7 kg)   BMI 30.55 kg/m   Physical Exam  Constitutional: She is oriented to person, place, and time. She appears well-developed and well-nourished.  HENT:  Head: Normocephalic and atraumatic.  Eyes: EOM are normal. Pupils are equal, round, and reactive to light.  Neck:  No carotid bruits  Pulmonary/Chest: Effort normal.  Musculoskeletal:       Right knee: She exhibits effusion (mild).  Neurological: She is alert and oriented to person,  place, and time.  Skin: Skin is warm and dry.  Psychiatric: She has a normal mood and affect. Her behavior is normal. Judgment and thought content normal.    Right Knee Exam   Tenderness  The patient is experiencing tenderness in the medial joint line and lateral joint line.  Range of Motion  Extension: 5  Flexion: 100   Other  Sensation: normal Pulse: present Swelling: mild Other tests: effusion (mild) present      Specialty Comments:  No specialty comments available.  Imaging: Xr Knee Complete 4 Views Right  Result Date: 03/28/2016 4 view x-ray of the right knee reveals tricompartmental degenerative changes. There is a  calcification both medially and laterally at the joint lines consistent with chondrocalcinosis. Medial joint space is narrowed at least 50% in comparison to the lateral. Periarticular spurring is noted both medially and laterally as well as in the patellofemoral area.    PMFS History: Patient Active Problem List   Diagnosis Date Noted  . Chronic pain of right knee 03/28/2016  . Unilateral primary osteoarthritis, right knee 03/28/2016  . Chondrocalcinosis of right knee 03/28/2016  . Acute bronchitis 03/30/2015  . HTN (hypertension) 03/04/2013  . Osteopenia 03/04/2013  . Dyspnea 01/20/2013  . Bronchospasm, acute 11/22/2011  . Urticaria 11/22/2011  . Family history of colon cancer 09/09/2011  . Obesity 09/09/2011  . Hypokalemia 06/18/2011  . Hyperlipidemia   . Thyroid disease   . Fatty liver disease, nonalcoholic   . DJD (degenerative joint disease) of hip    Past Medical History:  Diagnosis Date  . Diverticulosis   . DJD (degenerative joint disease) of hip   . DJD (degenerative joint disease) of hip    s/P R hip replacement  . Elevated serum alkaline phosphatase level   . Fatty liver disease, nonalcoholic   . Hyperlipidemia   . Internal hemorrhoids   . Obesity   . Post-operative nausea and vomiting   . Thyroid disease    hypo    Family History  Problem Relation Age of Onset  . HIV Brother   . Colon cancer Mother 7  . Heart disease Father     Past Surgical History:  Procedure Laterality Date  . CARPAL TUNNEL RELEASE Bilateral   . DENTAL SURGERY    . JOINT REPLACEMENT  2004   R hip  . [place], 2008   2 left and 1 right  . TOE FUSION  2000's   Social History   Occupational History  . Retired    Social History Main Topics  . Smoking status: Former Smoker    Packs/day: 2.00    Years: 20.00    Types: Cigarettes    Quit date: 04/03/1983  . Smokeless tobacco: Never Used  . Alcohol use 3.5 oz/week    7 Standard drinks or equivalent per  week     Comment: drinks on weekends per pt  . Drug use: No  . Sexual activity: No

## 2016-03-29 LAB — SYNOVIAL CELL COUNT + DIFF, W/ CRYSTALS
BASOPHILS, %: 0 %
EOSINOPHILS-SYNOVIAL: 0 % (ref 0–2)
LYMPHOCYTES-SYNOVIAL FLD: 13 % (ref 0–74)
MONOCYTE/MACROPHAGE: 41 % (ref 0–69)
NEUTROPHIL, SYNOVIAL: 43 % — AB (ref 0–24)
Synoviocytes, %: 3 % (ref 0–15)

## 2016-04-01 LAB — BODY FLUID CULTURE
GRAM STAIN: NONE SEEN
Organism ID, Bacteria: NO GROWTH

## 2016-07-04 DIAGNOSIS — D485 Neoplasm of uncertain behavior of skin: Secondary | ICD-10-CM | POA: Diagnosis not present

## 2016-07-04 DIAGNOSIS — L249 Irritant contact dermatitis, unspecified cause: Secondary | ICD-10-CM | POA: Diagnosis not present

## 2016-07-04 DIAGNOSIS — D224 Melanocytic nevi of scalp and neck: Secondary | ICD-10-CM | POA: Diagnosis not present

## 2016-10-12 ENCOUNTER — Other Ambulatory Visit: Payer: Self-pay | Admitting: Internal Medicine

## 2016-10-12 DIAGNOSIS — Z1231 Encounter for screening mammogram for malignant neoplasm of breast: Secondary | ICD-10-CM

## 2016-11-09 ENCOUNTER — Ambulatory Visit
Admission: RE | Admit: 2016-11-09 | Discharge: 2016-11-09 | Disposition: A | Discharge status: Home / Self Care | Payer: Medicare Other | Source: Ambulatory Visit | Attending: Internal Medicine | Admitting: Internal Medicine

## 2016-11-09 DIAGNOSIS — Z1231 Encounter for screening mammogram for malignant neoplasm of breast: Secondary | ICD-10-CM

## 2016-11-28 DIAGNOSIS — I10 Essential (primary) hypertension: Secondary | ICD-10-CM | POA: Diagnosis not present

## 2016-11-28 DIAGNOSIS — E7800 Pure hypercholesterolemia, unspecified: Secondary | ICD-10-CM | POA: Diagnosis not present

## 2016-11-28 DIAGNOSIS — E559 Vitamin D deficiency, unspecified: Secondary | ICD-10-CM | POA: Diagnosis not present

## 2016-11-28 DIAGNOSIS — E038 Other specified hypothyroidism: Secondary | ICD-10-CM | POA: Diagnosis not present

## 2017-01-15 DIAGNOSIS — Z23 Encounter for immunization: Secondary | ICD-10-CM | POA: Diagnosis not present

## 2017-05-22 DIAGNOSIS — R7309 Other abnormal glucose: Secondary | ICD-10-CM | POA: Diagnosis not present

## 2017-05-22 DIAGNOSIS — Z634 Disappearance and death of family member: Secondary | ICD-10-CM | POA: Diagnosis not present

## 2017-05-22 DIAGNOSIS — E039 Hypothyroidism, unspecified: Secondary | ICD-10-CM | POA: Diagnosis not present

## 2017-05-22 DIAGNOSIS — K219 Gastro-esophageal reflux disease without esophagitis: Secondary | ICD-10-CM | POA: Diagnosis not present

## 2017-05-22 DIAGNOSIS — Z1389 Encounter for screening for other disorder: Secondary | ICD-10-CM | POA: Diagnosis not present

## 2017-05-22 DIAGNOSIS — Z0000 Encounter for general adult medical examination without abnormal findings: Secondary | ICD-10-CM | POA: Diagnosis not present

## 2017-05-22 DIAGNOSIS — I10 Essential (primary) hypertension: Secondary | ICD-10-CM | POA: Diagnosis not present

## 2017-05-22 DIAGNOSIS — R634 Abnormal weight loss: Secondary | ICD-10-CM | POA: Diagnosis not present

## 2017-05-22 DIAGNOSIS — E7800 Pure hypercholesterolemia, unspecified: Secondary | ICD-10-CM | POA: Diagnosis not present

## 2017-05-29 ENCOUNTER — Ambulatory Visit (INDEPENDENT_AMBULATORY_CARE_PROVIDER_SITE_OTHER): Payer: Medicare Other | Admitting: Orthopedic Surgery

## 2017-09-05 ENCOUNTER — Encounter (INDEPENDENT_AMBULATORY_CARE_PROVIDER_SITE_OTHER): Payer: Self-pay | Admitting: Orthopaedic Surgery

## 2017-09-05 ENCOUNTER — Ambulatory Visit (INDEPENDENT_AMBULATORY_CARE_PROVIDER_SITE_OTHER): Payer: Medicare Other | Admitting: Orthopaedic Surgery

## 2017-09-05 ENCOUNTER — Ambulatory Visit (INDEPENDENT_AMBULATORY_CARE_PROVIDER_SITE_OTHER): Payer: Medicare Other

## 2017-09-05 VITALS — BP 161/69 | HR 59 | Ht 64.0 in | Wt 175.0 lb

## 2017-09-05 DIAGNOSIS — M25562 Pain in left knee: Secondary | ICD-10-CM | POA: Diagnosis not present

## 2017-09-05 DIAGNOSIS — G8929 Other chronic pain: Secondary | ICD-10-CM

## 2017-09-05 DIAGNOSIS — M1712 Unilateral primary osteoarthritis, left knee: Secondary | ICD-10-CM | POA: Diagnosis not present

## 2017-09-05 MED ORDER — LIDOCAINE HCL 1 % IJ SOLN
2.0000 mL | INTRAMUSCULAR | Status: AC | PRN
Start: 2017-09-05 — End: 2017-09-05
  Administered 2017-09-05: 2 mL

## 2017-09-05 MED ORDER — METHYLPREDNISOLONE ACETATE 40 MG/ML IJ SUSP
80.0000 mg | INTRAMUSCULAR | Status: AC | PRN
  Administered 2017-09-05: 80 mg

## 2017-09-05 MED ORDER — BUPIVACAINE HCL 0.5 % IJ SOLN
2.0000 mL | INTRAMUSCULAR | Status: AC | PRN
  Administered 2017-09-05: 2 mL via INTRA_ARTICULAR

## 2017-09-05 NOTE — Progress Notes (Signed)
Office Visit Note   Patient: Allison [name]           Date of Birth: Jan 09, 1946           MRN: [id number] Visit Date: 09/05/2017              Requested by: Lanice Shirts, MD [address] [place], [place] [address] PCP: Lanice Shirts, MD   Assessment & Plan: Visit Diagnoses:  1. Chronic pain of left knee   2. Unilateral primary osteoarthritis, left knee     Plan: Primary osteoarthritis left knee.  Knee was aspirated 25 cc of clear yellow fluid.  I injected cortisone.  We will plan to see back as necessary  Follow-Up Instructions: Return if symptoms worsen or fail to improve.   Orders:  Orders Placed This Encounter  Procedures  . Large Joint Inj: L knee  . XR KNEE 3 VIEW LEFT   No orders of the defined types were placed in this encounter.     Procedures: Large Joint Inj: L knee on 09/05/2017 3:12 PM Indications: pain and diagnostic evaluation Details: 25 G 1.5 in needle, anteromedial approach  Arthrogram: No  Medications: 2 mL lidocaine 1 %; 2 mL bupivacaine 0.5 %; 80 mg methylPREDNISolone acetate 40 MG/ML Aspirate: 25 mL clear and yellow Procedure, treatment alternatives, risks and benefits explained, specific risks discussed. Consent was given by the patient. Patient was prepped and draped in the usual sterile fashion.       Clinical Data: No additional findings.   Subjective: Chief Complaint  Patient presents with  . Left Knee - Follow-up, Pain  . Follow-up    L KNEE PAIN FOR 10 DAYS AFTER BEING AT [facility]AK[facility] STAIRS AT [place]  Allison [name] has an established diagnosis of osteoarthritis of her right knee.  A year ago she had a cortisone injection that has "lasted".  She recently had onset of left knee pain without injury or trauma.  Occasion she has had a feeling of tightness and pain along the medial and lateral compartments.  No groin pain.  No back pain.  Status post hip replacement in 2004 without any problems.  Difficulty sleeping  and finding a comfortable position.  No feeling of giving way  HPI  Review of Systems  Constitutional: Negative for fatigue and fever.  HENT: Negative for ear pain.   Eyes: Negative for pain.  Respiratory: Negative for cough and shortness of breath.   Cardiovascular: Positive for leg swelling.  Gastrointestinal: Negative for constipation and diarrhea.  Genitourinary: Negative for difficulty urinating.  Musculoskeletal: Negative for back pain and neck pain.  Skin: Negative for rash.  Allergic/Immunologic: Negative for food allergies.  Neurological: Negative for weakness and numbness.  Hematological: Does not bruise/bleed easily.  Psychiatric/Behavioral: Negative for sleep disturbance.     Objective: Vital Signs: BP (!) 161/69 (BP Location: Left Arm, Patient Position: Sitting, Cuff Size: Normal)   Pulse (!) 59   Ht 5\' 4"  (1.626 m)   Wt 175 lb (79.4 kg)   BMI 30.04 kg/m   Physical Exam  Constitutional: She is oriented to person, place, and time. She appears well-developed and well-nourished.  HENT:  Mouth/Throat: Oropharynx is clear and moist.  Eyes: Pupils are equal, round, and reactive to light. EOM are normal.  Pulmonary/Chest: Effort normal.  Neurological: She is alert and oriented to person, place, and time.  Skin: Skin is warm and dry.  Psychiatric: She has a normal mood and affect. Her behavior  is normal.    Ortho Exam awake alert and oriented x3 comfortable sitting.  Examination left knee reveals a positive effusion.  Some patellar crepitation.  Pain along the medial and lateral joint without crepitation.  Full extension.  Flexion over 100 degrees without instability.  No popliteal pain or mass.  No calf pain.  No distal edema.  Neurovascular exam intact.  Straight leg raise negative.  Painless range of motion both hips  Specialty Comments:  No specialty comments available.  Imaging: Xr Knee 3 View Left  Result Date: 09/05/2017 3 views left knee were obtained in  the standing projection.  There are tricompartmental degenerative changes.  On the AP view there is narrowing of the medial lateral joint space with peripheral osteophytes and subchondral sclerosis bilaterally more so laterally than medially.  There is slight increased valgus.  No ectopic calcification.  There are osteophytes both medial lateral patella facets.  Films are consistent with advanced osteoarthritis    PMFS History: Patient Active Problem List   Diagnosis Date Noted  . Chronic pain of right knee 03/28/2016  . Unilateral primary osteoarthritis, right knee 03/28/2016  . Chondrocalcinosis of right knee 03/28/2016  . Acute bronchitis 03/30/2015  . HTN (hypertension) 03/04/2013  . Osteopenia 03/04/2013  . Dyspnea 01/20/2013  . Bronchospasm, acute 11/22/2011  . Urticaria 11/22/2011  . Family history of colon cancer 09/09/2011  . Obesity 09/09/2011  . Hypokalemia 06/18/2011  . Hyperlipidemia   . Thyroid disease   . Fatty liver disease, nonalcoholic   . DJD (degenerative joint disease) of hip    Past Medical History:  Diagnosis Date  . Diverticulosis   . DJD (degenerative joint disease) of hip   . DJD (degenerative joint disease) of hip    s/P R hip replacement  . Elevated serum alkaline phosphatase level   . Fatty liver disease, nonalcoholic   . Hyperlipidemia   . Internal hemorrhoids   . Obesity   . Post-operative nausea and vomiting   . Thyroid disease    hypo    Family History  Problem Relation Age of Onset  . HIV Brother   . Colon cancer Mother 70  . Heart disease Father     Past Surgical History:  Procedure Laterality Date  . CARPAL TUNNEL RELEASE Bilateral   . DENTAL SURGERY    . JOINT REPLACEMENT  2004   R hip  . [place], 2008   2 left and 1 right  . TOE FUSION  2000's   Social History   Occupational History  . Occupation: Retired  Tobacco Use  . Smoking status: Former Smoker    Packs/day: 2.00    Years: 20.00    Pack  years: 40.00    Types: Cigarettes    Last attempt to quit: 04/03/1983    Years since quitting: 34.4  . Smokeless tobacco: Never Used  Substance and Sexual Activity  . Alcohol use: Yes    Alcohol/week: 3.5 oz    Types: 7 Standard drinks or equivalent per week    Comment: drinks on weekends per pt  . Drug use: No  . Sexual activity: Never    Birth control/protection: Post-menopausal

## 2017-09-13 DIAGNOSIS — H52223 Regular astigmatism, bilateral: Secondary | ICD-10-CM | POA: Diagnosis not present

## 2017-09-13 DIAGNOSIS — H2523 Age-related cataract, morgagnian type, bilateral: Secondary | ICD-10-CM | POA: Diagnosis not present

## 2017-09-13 DIAGNOSIS — H524 Presbyopia: Secondary | ICD-10-CM | POA: Diagnosis not present

## 2017-09-13 DIAGNOSIS — H5213 Myopia, bilateral: Secondary | ICD-10-CM | POA: Diagnosis not present

## 2017-10-29 ENCOUNTER — Other Ambulatory Visit: Payer: Self-pay | Admitting: Internal Medicine

## 2017-10-29 DIAGNOSIS — Z1231 Encounter for screening mammogram for malignant neoplasm of breast: Secondary | ICD-10-CM

## 2017-11-20 ENCOUNTER — Ambulatory Visit
Admission: RE | Admit: 2017-11-20 | Discharge: 2017-11-20 | Disposition: A | Discharge status: Home / Self Care | Payer: Medicare Other | Source: Ambulatory Visit | Attending: Internal Medicine | Admitting: Internal Medicine

## 2017-11-20 DIAGNOSIS — Z1231 Encounter for screening mammogram for malignant neoplasm of breast: Secondary | ICD-10-CM | POA: Diagnosis not present

## 2017-11-20 DIAGNOSIS — E7800 Pure hypercholesterolemia, unspecified: Secondary | ICD-10-CM | POA: Diagnosis not present

## 2017-11-20 DIAGNOSIS — E559 Vitamin D deficiency, unspecified: Secondary | ICD-10-CM | POA: Diagnosis not present

## 2017-11-20 DIAGNOSIS — R7303 Prediabetes: Secondary | ICD-10-CM | POA: Diagnosis not present

## 2017-11-20 DIAGNOSIS — E039 Hypothyroidism, unspecified: Secondary | ICD-10-CM | POA: Diagnosis not present

## 2017-11-25 DIAGNOSIS — R946 Abnormal results of thyroid function studies: Secondary | ICD-10-CM | POA: Diagnosis not present

## 2017-11-25 DIAGNOSIS — D649 Anemia, unspecified: Secondary | ICD-10-CM | POA: Diagnosis not present

## 2017-11-25 DIAGNOSIS — E559 Vitamin D deficiency, unspecified: Secondary | ICD-10-CM | POA: Diagnosis not present

## 2017-11-25 DIAGNOSIS — R7303 Prediabetes: Secondary | ICD-10-CM | POA: Diagnosis not present

## 2018-01-10 DIAGNOSIS — Z23 Encounter for immunization: Secondary | ICD-10-CM | POA: Diagnosis not present

## 2018-01-14 ENCOUNTER — Other Ambulatory Visit (INDEPENDENT_AMBULATORY_CARE_PROVIDER_SITE_OTHER): Payer: Medicare Other

## 2018-01-14 ENCOUNTER — Encounter: Payer: Self-pay | Admitting: Internal Medicine

## 2018-01-14 ENCOUNTER — Ambulatory Visit (INDEPENDENT_AMBULATORY_CARE_PROVIDER_SITE_OTHER): Payer: Medicare Other | Admitting: Internal Medicine

## 2018-01-14 VITALS — BP 144/82 | HR 68 | Ht 64.0 in | Wt 183.0 lb

## 2018-01-14 DIAGNOSIS — Z86010 Personal history of colonic polyps: Secondary | ICD-10-CM | POA: Diagnosis not present

## 2018-01-14 DIAGNOSIS — D649 Anemia, unspecified: Secondary | ICD-10-CM | POA: Diagnosis not present

## 2018-01-14 LAB — IRON: Iron: 57 ug/dL (ref 42–145)

## 2018-01-14 LAB — VITAMIN B12: VITAMIN B 12: 407 pg/mL (ref 211–911)

## 2018-01-14 LAB — IBC PANEL
Iron: 57 ug/dL (ref 42–145)
Saturation Ratios: 11.8 % — ABNORMAL LOW (ref 20.0–50.0)
Transferrin: 344 mg/dL (ref 212.0–360.0)

## 2018-01-14 LAB — FERRITIN: Ferritin: 41.8 ng/mL (ref 10.0–291.0)

## 2018-01-14 NOTE — Progress Notes (Signed)
HISTORY OF PRESENT ILLNESS:  Allison [name] is a 72 y.o. female previously seen in this office for elevated hepatic transaminases felt secondary to fatty liver and colorectal neoplasia screening/surveillance.  She is sent today by her primary care provider regarding normocytic anemia.  I have reviewed outside records and laboratories.  Blood work on November 20, 2017 revealed hemoglobin 11.6 (normal 12-16).  Hemoglobin 1 year previous 13.8.  MCV normal at 90.1.  Other blood lines normal.  Normal liver test.  No iron studies.  Patient was apparently placed on an iron supplement and GI evaluation requested.  Patient has not had Hemoccult studies performed.  She does have a history of colon cancer in multiple first-degree relatives before age 13.  Previous examinations 2005 and 2011 were negative for neoplasia.  Most recent examination October 2017 revealed diminutive adenomas which were removed and left-sided diverticulosis as well as internal hemorrhoids.  Follow-up in 5 years recommended.  Patient's GI review of systems is negative.  No upper GI complaints.  No melena.  No hematochezia.  No unexplained weight loss.  Abdominal ultrasound from 2014 suggesting hepatic steatosis.  REVIEW OF SYSTEMS:  All non-GI ROS negative unless otherwise stated in the HPI except for anxiety, arthritis, sleeping problems  Past Medical History:  Diagnosis Date  . Anemia   . Asthmatic bronchitis   . Carpal tunnel syndrome   . Diverticulosis   . DJD (degenerative joint disease) of hip   . DJD (degenerative joint disease) of hip    s/P R hip replacement  . Elevated serum alkaline phosphatase level   . Fatty liver disease, nonalcoholic   . GERD (gastroesophageal reflux disease)   . Hyperlipidemia   . Hypertension   . Internal hemorrhoids   . Obesity   . Osteoarthritis   . Osteopenia   . Post-operative nausea and vomiting   . Prediabetes   . Thyroid disease    hypo  . Vitamin D deficiency     Past Surgical  History:  Procedure Laterality Date  . CARPAL TUNNEL RELEASE Bilateral   . DENTAL SURGERY    . JOINT REPLACEMENT  2004   R hip  . [place], 2008   2 left and 1 right  . TOE FUSION  2000's    Social History Allison [name]  reports that she quit smoking about 34 years ago. Her smoking use included cigarettes. She has a 40.00 pack-year smoking history. She has never used smokeless tobacco. She reports that she drinks about 7.0 standard drinks of alcohol per week. She reports that she does not use drugs.  family history includes Colon cancer (age of onset: 18) in her mother; HIV in her brother; Heart disease in her father; Ovarian cancer (age of onset: 82) in her maternal aunt.  No Known Allergies     PHYSICAL EXAMINATION: Vital signs: BP (!) 144/82   Pulse 68   Ht 5\' 4"  (1.626 m)   Wt 183 lb (83 kg)   BMI 31.41 kg/m   Constitutional: Pleasant, generally well-appearing, no acute distress Psychiatric: alert and oriented x3, cooperative Eyes: extraocular movements intact, anicteric, conjunctiva pink Mouth: oral pharynx moist, no lesions Neck: supple no lymphadenopathy Cardiovascular: heart regular rate and rhythm, no murmur Lungs: clear to auscultation bilaterally Abdomen: soft, obese, nontender, nondistended, no obvious ascites, no peritoneal signs, normal bowel sounds, no organomegaly Rectal: Deferred Extremities: no clubbing, cyanosis, or lower extremity edema bilaterally Skin: no lesions on visible extremities Neuro: No focal  deficits.  Cranial nerves intact. No asterixis.    ASSESSMENT:  1.  Normocytic anemia.  Mild.  Clinical significance uncertain. 2.  History of adenomatous colon polyps colonoscopy October 2017 3.  Family history of colon cancer and multiple first-degree relatives before age 10 4.  History of elevated liver test.  Resolved.  Felt secondary to fatty liver.  The patient has achieved recommended weight loss.   PLAN:  1.   Anemia panel 2.  Hemoccult studies 3.  If Hemoccult studies positive for any suggestion of iron deficiency, then proceed with colonoscopy to rule out interval colonic lesion to explain mild anemia.  If this were negative, and anemia were to persist or worsen, then hematology referral could be considered by her PCP  25-minute spent face-to-face with the patient.  Greater than 50% of time used for counseling regarding her anemia, impressions, work-up, and plans

## 2018-01-14 NOTE — Patient Instructions (Signed)
Your provider has requested that you go to the basement level for lab work before leaving today. Press "B" on the elevator. The lab is located at the first door on the left as you exit the elevator.   You have been given hemoccult cards to complete at home and mail back.

## 2018-01-17 DIAGNOSIS — L820 Inflamed seborrheic keratosis: Secondary | ICD-10-CM | POA: Diagnosis not present

## 2018-01-17 DIAGNOSIS — L821 Other seborrheic keratosis: Secondary | ICD-10-CM | POA: Diagnosis not present

## 2018-01-27 ENCOUNTER — Other Ambulatory Visit (INDEPENDENT_AMBULATORY_CARE_PROVIDER_SITE_OTHER): Payer: Medicare Other

## 2018-01-27 DIAGNOSIS — D649 Anemia, unspecified: Secondary | ICD-10-CM | POA: Diagnosis not present

## 2018-01-27 DIAGNOSIS — Z86010 Personal history of colonic polyps: Secondary | ICD-10-CM | POA: Diagnosis not present

## 2018-01-27 LAB — HEMOCCULT SLIDES (X 3 CARDS)
Fecal Occult Blood: NEGATIVE
OCCULT 1: NEGATIVE
OCCULT 2: NEGATIVE
OCCULT 3: NEGATIVE
OCCULT 4: NEGATIVE
OCCULT 5: NEGATIVE

## 2018-02-18 DIAGNOSIS — D508 Other iron deficiency anemias: Secondary | ICD-10-CM | POA: Diagnosis not present

## 2018-03-11 ENCOUNTER — Encounter (INDEPENDENT_AMBULATORY_CARE_PROVIDER_SITE_OTHER): Payer: Self-pay | Admitting: Orthopaedic Surgery

## 2018-03-11 ENCOUNTER — Ambulatory Visit (INDEPENDENT_AMBULATORY_CARE_PROVIDER_SITE_OTHER): Payer: Medicare Other | Admitting: Orthopaedic Surgery

## 2018-03-11 VITALS — BP 129/80 | HR 72 | Ht 64.0 in | Wt 174.0 lb

## 2018-03-11 DIAGNOSIS — M1712 Unilateral primary osteoarthritis, left knee: Secondary | ICD-10-CM | POA: Diagnosis not present

## 2018-03-11 DIAGNOSIS — M25542 Pain in joints of left hand: Secondary | ICD-10-CM

## 2018-03-11 DIAGNOSIS — M25562 Pain in left knee: Secondary | ICD-10-CM

## 2018-03-11 DIAGNOSIS — M25541 Pain in joints of right hand: Secondary | ICD-10-CM | POA: Diagnosis not present

## 2018-03-11 MED ORDER — METHYLPREDNISOLONE ACETATE 40 MG/ML IJ SUSP
80.0000 mg | INTRAMUSCULAR | Status: AC | PRN
  Administered 2018-03-11: 80 mg

## 2018-03-11 MED ORDER — LIDOCAINE HCL 1 % IJ SOLN
2.0000 mL | INTRAMUSCULAR | Status: AC | PRN
  Administered 2018-03-11: 2 mL

## 2018-03-11 MED ORDER — BUPIVACAINE HCL 0.5 % IJ SOLN
2.0000 mL | INTRAMUSCULAR | Status: AC | PRN
  Administered 2018-03-11: 2 mL via INTRA_ARTICULAR

## 2018-03-11 MED ORDER — DICLOFENAC SODIUM 1 % TD GEL: 2.0000 g | Freq: Four times a day (QID) | TRANSDERMAL | 1 refills | Status: AC

## 2018-03-11 NOTE — Progress Notes (Signed)
Office Visit Note   Patient: Allison [name]           Date of Birth: 1946-03-06           MRN: [id number] Visit Date: 03/11/2018              Requested by: Lanice Shirts, MD [address] [place], [place] [address] PCP: Lanice Shirts, MD   Assessment & Plan: Visit Diagnoses:  1. Unilateral primary osteoarthritis, left knee   2. Arthralgia of both hands   3. Arthralgia of knee, left     Plan:  #1: Corticosteroid injection to the left knee was accomplished atraumatically #2: We will give her a prescription for Voltaren gel which she can use for her knees and her hands as needed #3: She will discuss with employee health about receiving another injection which may give her the same symptoms or worsen worsening symptoms.  That is up to their discretion.  Follow-Up Instructions: Return if symptoms worsen or fail to improve.   Orders:  Orders Placed This Encounter  Procedures  . Large Joint Inj   Meds ordered this encounter  Medications  . diclofenac sodium (VOLTAREN) 1 % GEL    Sig: Apply 2-4 g topically 4 (four) times daily.    Dispense:  5 Tube    Refill:  1    Order Specific Question:   Supervising Provider    Answer:   Garald Balding [[id number]]      Procedures: Large Joint Inj: L knee on 03/11/2018 9:06 AM Indications: pain and diagnostic evaluation Details: 25 G 1.5 in needle, anteromedial approach  Arthrogram: No  Medications: 2 mL lidocaine 1 %; 2 mL bupivacaine 0.5 %; 80 mg methylPREDNISolone acetate 40 MG/ML Procedure, treatment alternatives, risks and benefits explained, specific risks discussed. Consent was given by the patient. Patient was prepped and draped in the usual sterile fashion.       Clinical Data: No additional findings.   Subjective: Chief Complaint  Patient presents with  . Left Knee - Pain    Went to beach gave her trouble with steps, had to get rubella injection, and woke up with pain in all her  joints, and in her hands she swelling in her hands, after 3 days after getting injection    HPI  Allison [name] is a very pleasant 72 year old white female who is seen today for evaluation of pain and swelling of her left knee as well as both hands.  She did have a rubella injection in November and she developed swelling in both her hands as well as in her left knee.  This had worsened to the point where she had difficulty with ambulation and use of her hands.  However this is improved.  She still though having pain discomfort in her left knee.  She did have a cortisone injection back in June and would like to consider a second injection now.  Review of Systems  Constitutional: Negative for fatigue and fever.  HENT: Negative for ear pain.   Eyes: Negative for pain.  Respiratory: Negative for cough and shortness of breath.   Cardiovascular: Positive for leg swelling.  Gastrointestinal: Negative for constipation and diarrhea.  Genitourinary: Negative for difficulty urinating.  Musculoskeletal: Negative for back pain and neck pain.  Skin: Negative for rash.  Allergic/Immunologic: Negative for food allergies.  Neurological: Negative for weakness and numbness.  Hematological: Does not bruise/bleed easily.  Psychiatric/Behavioral: Negative for sleep disturbance.  Objective: Vital Signs: BP 129/80 (BP Location: Left Arm, Patient Position: Sitting)   Pulse 72   Ht 5\' 4"  (1.626 m)   Wt 174 lb (78.9 kg)   BMI 29.87 kg/m   Physical Exam  Constitutional: She is oriented to person, place, and time. She appears well-developed and well-nourished.  HENT:  Mouth/Throat: Oropharynx is clear and moist.  Eyes: Pupils are equal, round, and reactive to light. EOM are normal.  Pulmonary/Chest: Effort normal.  Neurological: She is alert and oriented to person, place, and time.  Skin: Skin is warm and dry.  Psychiatric: She has a normal mood and affect. Her behavior is normal.    Ortho Exam  Exam today  reveals the left knee to have a trace to 1+ effusion without warmth.  No erythema at this time noted.  Marked crepitance with range of motion.  Some pseudolaxity is noted with varus and valgus stressing but has excellent endpoints.  Pain intermittently noted along the joint lines.  Pain with patellar compression.  Neurovascular intact distally.  Specialty Comments:  No specialty comments available.  Imaging: No results found.   PMFS History: Current Outpatient Medications  Medication Sig Dispense Refill  . albuterol (PROVENTIL HFA;VENTOLIN HFA) 108 (90 BASE) MCG/ACT inhaler Take 2 inhalations tid as needed 1 Inhaler 1  . atorvastatin (LIPITOR) 20 MG tablet TAKE 1 TABLET EVERY DAY 90 tablet 3  . Calcium Carbonate-Vit D-Min (CALCIUM 1200 PO) Take 1 tablet by mouth daily.      . Cholecalciferol (VITAMIN D3) 5000 UNITS TABS Take 2,000 Units by mouth daily.     . Fe Fum-FePoly-Vit C-Vit B3 (INTEGRA PO) Take by mouth. 125-3-40 mg cap    . furosemide (LASIX) 80 MG tablet TAKE 1 TABLET EVERY DAY 90 tablet 3  . levothyroxine (SYNTHROID, LEVOTHROID) 125 MCG tablet Take as directed (Patient taking differently: Take 100 mcg by mouth. Take as directed) 90 tablet 3  . Omega-3 Fatty Acids (FISH OIL) 1000 MG CAPS Take by mouth daily.    . pimecrolimus (ELIDEL) 1 % cream Apply topically 2 (two) times daily as needed.    . potassium chloride (MICRO-K) 10 MEQ CR capsule Take 2 capsules (20 mEq total) by mouth daily. 180 capsule 3  . RABEprazole (ACIPHEX) 20 MG tablet Take 1 tablet (20 mg total) by mouth daily. 90 tablet 3  . diclofenac sodium (VOLTAREN) 1 % GEL Apply 2-4 g topically 4 (four) times daily. 5 Tube 1   No current facility-administered medications for this visit.     Patient Active Problem List   Diagnosis Date Noted  . Chronic pain of right knee 03/28/2016  . Unilateral primary osteoarthritis, right knee 03/28/2016  . Chondrocalcinosis of right knee 03/28/2016  . Acute bronchitis  03/30/2015  . HTN (hypertension) 03/04/2013  . Osteopenia 03/04/2013  . Dyspnea 01/20/2013  . Bronchospasm, acute 11/22/2011  . Urticaria 11/22/2011  . Family history of colon cancer 09/09/2011  . Obesity 09/09/2011  . Hypokalemia 06/18/2011  . Hyperlipidemia   . Thyroid disease   . Fatty liver disease, nonalcoholic   . DJD (degenerative joint disease) of hip    Past Medical History:  Diagnosis Date  . Anemia   . Asthmatic bronchitis   . Carpal tunnel syndrome   . Diverticulosis   . DJD (degenerative joint disease) of hip   . DJD (degenerative joint disease) of hip    s/P R hip replacement  . Elevated serum alkaline phosphatase level   . Fatty liver disease,  nonalcoholic   . GERD (gastroesophageal reflux disease)   . Hyperlipidemia   . Hypertension   . Internal hemorrhoids   . Obesity   . Osteoarthritis   . Osteopenia   . Post-operative nausea and vomiting   . Prediabetes   . Thyroid disease    hypo  . Vitamin D deficiency     Family History  Problem Relation Age of Onset  . HIV Brother   . Colon cancer Mother 38  . Heart disease Father   . Ovarian cancer Maternal Aunt 58       lung cancer    Past Surgical History:  Procedure Laterality Date  . CARPAL TUNNEL RELEASE Bilateral   . DENTAL SURGERY    . JOINT REPLACEMENT  2004   R hip  . [place], 2008   2 left and 1 right  . TOE FUSION  2000's   Social History   Occupational History  . Occupation: Retired  Tobacco Use  . Smoking status: Former Smoker    Packs/day: 2.00    Years: 20.00    Pack years: 40.00    Types: Cigarettes    Last attempt to quit: 04/03/1983    Years since quitting: 34.9  . Smokeless tobacco: Never Used  Substance and Sexual Activity  . Alcohol use: Yes    Alcohol/week: 7.0 standard drinks    Types: 7 Standard drinks or equivalent per week    Comment: drinks on weekends per pt  . Drug use: No  . Sexual activity: Never    Birth control/protection:  Post-menopausal

## 2018-03-12 DIAGNOSIS — H0289 Other specified disorders of eyelid: Secondary | ICD-10-CM | POA: Diagnosis not present

## 2018-03-27 DIAGNOSIS — J069 Acute upper respiratory infection, unspecified: Secondary | ICD-10-CM | POA: Diagnosis not present

## 2018-05-27 ENCOUNTER — Other Ambulatory Visit: Payer: Self-pay | Admitting: Internal Medicine

## 2018-05-27 DIAGNOSIS — E2839 Other primary ovarian failure: Secondary | ICD-10-CM

## 2018-06-06 ENCOUNTER — Telehealth: Payer: Self-pay | Admitting: Internal Medicine

## 2018-06-06 NOTE — Telephone Encounter (Signed)
Not taking new patients sorry.  

## 2018-06-06 NOTE — Telephone Encounter (Signed)
Copied from [place] [id number]. Topic: General - Other >> Jun 06, 2018  1:44 PM Carolyn Stare wrote:  Pt saw Dr Sharlet Salina back in 2016 but has been seeing a PCP and that PCP is retiring in May and she is asking id Dr Sharlet Salina will except her as a new pt

## 2018-06-06 NOTE — Telephone Encounter (Signed)
Dr. Sharlet Salina, would you be willing to see this patient to reestablish care? See message below.  Please advise.

## 2018-06-06 NOTE — Telephone Encounter (Signed)
Spoke to patient and informed.

## 2018-07-31 ENCOUNTER — Other Ambulatory Visit: Payer: Medicare Other

## 2018-12-26 ENCOUNTER — Other Ambulatory Visit: Payer: Self-pay | Admitting: Internal Medicine

## 2018-12-26 DIAGNOSIS — Z1231 Encounter for screening mammogram for malignant neoplasm of breast: Secondary | ICD-10-CM

## 2018-12-26 DIAGNOSIS — M8580 Other specified disorders of bone density and structure, unspecified site: Secondary | ICD-10-CM

## 2019-02-05 ENCOUNTER — Ambulatory Visit
Admission: RE | Admit: 2019-02-05 | Discharge: 2019-02-05 | Disposition: A | Discharge status: Home / Self Care | Payer: Medicare Other | Source: Ambulatory Visit | Attending: Internal Medicine | Admitting: Internal Medicine

## 2019-02-05 ENCOUNTER — Other Ambulatory Visit: Payer: Self-pay | Admitting: Internal Medicine

## 2019-02-05 DIAGNOSIS — M47812 Spondylosis without myelopathy or radiculopathy, cervical region: Secondary | ICD-10-CM

## 2019-03-18 ENCOUNTER — Ambulatory Visit
Admission: RE | Admit: 2019-03-18 | Discharge: 2019-03-18 | Disposition: A | Discharge status: Home / Self Care | Payer: Medicare Other | Source: Ambulatory Visit | Attending: Internal Medicine | Admitting: Internal Medicine

## 2019-03-18 ENCOUNTER — Other Ambulatory Visit: Payer: Self-pay

## 2019-03-18 DIAGNOSIS — Z1231 Encounter for screening mammogram for malignant neoplasm of breast: Secondary | ICD-10-CM

## 2019-03-18 DIAGNOSIS — M8580 Other specified disorders of bone density and structure, unspecified site: Secondary | ICD-10-CM

## 2019-05-11 ENCOUNTER — Ambulatory Visit: Payer: Medicare Other

## 2019-07-24 ENCOUNTER — Encounter (HOSPITAL_BASED_OUTPATIENT_CLINIC_OR_DEPARTMENT_OTHER): Payer: Self-pay | Admitting: *Deleted

## 2019-07-24 ENCOUNTER — Other Ambulatory Visit: Payer: Self-pay

## 2019-07-24 ENCOUNTER — Emergency Department (HOSPITAL_BASED_OUTPATIENT_CLINIC_OR_DEPARTMENT_OTHER)
Admission: EM | Admit: 2019-07-24 | Discharge: 2019-07-25 | Disposition: A | Discharge status: Home / Self Care | Payer: Medicare Other | Attending: Emergency Medicine | Admitting: Emergency Medicine

## 2019-07-24 DIAGNOSIS — R55 Syncope and collapse: Secondary | ICD-10-CM | POA: Diagnosis present

## 2019-07-24 DIAGNOSIS — Y92008 Other place in unspecified non-institutional (private) residence as the place of occurrence of the external cause: Secondary | ICD-10-CM | POA: Diagnosis not present

## 2019-07-24 DIAGNOSIS — Z79899 Other long term (current) drug therapy: Secondary | ICD-10-CM | POA: Insufficient documentation

## 2019-07-24 DIAGNOSIS — W19XXXA Unspecified fall, initial encounter: Secondary | ICD-10-CM | POA: Insufficient documentation

## 2019-07-24 DIAGNOSIS — I10 Essential (primary) hypertension: Secondary | ICD-10-CM | POA: Insufficient documentation

## 2019-07-24 DIAGNOSIS — S0011XA Contusion of right eyelid and periocular area, initial encounter: Secondary | ICD-10-CM | POA: Diagnosis not present

## 2019-07-24 DIAGNOSIS — Z87891 Personal history of nicotine dependence: Secondary | ICD-10-CM | POA: Insufficient documentation

## 2019-07-24 DIAGNOSIS — Y9389 Activity, other specified: Secondary | ICD-10-CM | POA: Insufficient documentation

## 2019-07-24 DIAGNOSIS — Z23 Encounter for immunization: Secondary | ICD-10-CM | POA: Insufficient documentation

## 2019-07-24 DIAGNOSIS — S0083XA Contusion of other part of head, initial encounter: Secondary | ICD-10-CM

## 2019-07-24 DIAGNOSIS — Y999 Unspecified external cause status: Secondary | ICD-10-CM | POA: Insufficient documentation

## 2019-07-24 DIAGNOSIS — S0181XA Laceration without foreign body of other part of head, initial encounter: Secondary | ICD-10-CM | POA: Diagnosis not present

## 2019-07-24 DIAGNOSIS — S0003XA Contusion of scalp, initial encounter: Secondary | ICD-10-CM | POA: Insufficient documentation

## 2019-07-24 DIAGNOSIS — H05231 Hemorrhage of right orbit: Secondary | ICD-10-CM

## 2019-07-24 NOTE — ED Triage Notes (Signed)
Pt c/o waking up on kitchen floor, lac to chin and swelling around right eye.

## 2019-07-25 ENCOUNTER — Emergency Department (HOSPITAL_BASED_OUTPATIENT_CLINIC_OR_DEPARTMENT_OTHER): Payer: Medicare Other

## 2019-07-25 ENCOUNTER — Encounter (HOSPITAL_BASED_OUTPATIENT_CLINIC_OR_DEPARTMENT_OTHER): Payer: Self-pay | Admitting: Emergency Medicine

## 2019-07-25 MED ORDER — TETANUS-DIPHTH-ACELL PERTUSSIS 5-2.5-18.5 LF-MCG/0.5 IM SUSP
0.5000 mL | Freq: Once | INTRAMUSCULAR | Status: AC
  Administered 2019-07-25: 0.5 mL via INTRAMUSCULAR
  Filled 2019-07-25: qty 0.5

## 2019-07-25 MED ORDER — LIDOCAINE-EPINEPHRINE (PF) 2 %-1:200000 IJ SOLN
INTRAMUSCULAR | Status: AC
  Administered 2019-07-25: 10 mL
  Filled 2019-07-25: qty 10

## 2019-07-25 MED ORDER — LIDOCAINE-EPINEPHRINE (PF) 2 %-1:200000 IJ SOLN: 10.0000 mL | Freq: Once | INTRAMUSCULAR | Status: AC

## 2019-07-25 MED ORDER — SODIUM CHLORIDE 0.9 % IV BOLUS
1000.0000 mL | Freq: Once | INTRAVENOUS | Status: AC
  Administered 2019-07-25: 02:00:00 1000 mL via INTRAVENOUS

## 2019-07-25 NOTE — ED Provider Notes (Signed)
[facility] Provider Note: Georgena Spurling, MD, FACEP  CSN: [id number] MRN: [id number] ARRIVAL: 07/24/19 at [place]: [place]  Loss of Consciousness   HISTORY OF PRESENT ILLNESS  07/25/19 12:40 AM Allison [name] is a 74 y.o. female who donated blood yesterday morning.  Yesterday evening she was sitting on the sofa watching television.  She then found herself lying on the kitchen floor with a laceration to her chin not having recalled what precipitated this.  She also has some swelling around the right eye as well as a hematoma to her left occiput.  She rates associated head pain is a 3 out of 10, worse with palpation.  She has not been vomiting.  She denies neck pain.  She has no history of syncope or seizure disorder.  She did not bite her tongue.  She was not incontinent.   Past Medical History:  Diagnosis Date  . Anemia   . Asthmatic bronchitis   . Carpal tunnel syndrome   . Diverticulosis   . DJD (degenerative joint disease) of hip    s/P R hip replacement  . Elevated serum alkaline phosphatase level   . Fatty liver disease, nonalcoholic   . GERD (gastroesophageal reflux disease)   . Hyperlipidemia   . Hypertension   . Internal hemorrhoids   . Obesity   . Osteoarthritis   . Osteopenia   . Post-operative nausea and vomiting   . Prediabetes   . Thyroid disease    hypo  . Vitamin D deficiency     Past Surgical History:  Procedure Laterality Date  . CARPAL TUNNEL RELEASE Bilateral   . DENTAL SURGERY    . JOINT REPLACEMENT  2004   R hip  . [place], 2008   2 left and 1 right  . TOE FUSION  2000's    Family History  Problem Relation Age of Onset  . HIV Brother   . Colon cancer Mother 2  . Heart disease Father   . Ovarian cancer Maternal Aunt 58       lung cancer    Social History   Tobacco Use  . Smoking status: Former Smoker    Packs/day: 2.00    Years: 20.00    Pack years: 40.00    Types:  Cigarettes    Quit date: 04/03/1983    Years since quitting: 36.3  . Smokeless tobacco: Never Used  Substance Use Topics  . Alcohol use: Yes    Alcohol/week: 7.0 standard drinks    Types: 7 Standard drinks or equivalent per week    Comment: drinks on weekends per pt  . Drug use: No    Prior to Admission medications   Medication Sig Start Date End Date Taking? Authorizing Provider  albuterol (PROVENTIL HFA;VENTOLIN HFA) 108 (90 BASE) MCG/ACT inhaler Take 2 inhalations tid as needed 07/27/14   Schoenhoff, Altamese Cabal, MD  atorvastatin (LIPITOR) 20 MG tablet TAKE 1 TABLET EVERY DAY 08/04/15   Hoyt Koch, MD  Calcium Carbonate-Vit D-Min (CALCIUM 1200 PO) Take 1 tablet by mouth daily.      [provider]  Cholecalciferol (VITAMIN D3) 5000 UNITS TABS Take 2,000 Units by mouth daily.     [provider]  diclofenac sodium (VOLTAREN) 1 % GEL Apply 2-4 g topically 4 (four) times daily. 03/11/18   Cherylann Ratel, PA-C  Fe Fum-FePoly-Vit C-Vit B3 (INTEGRA PO) Take by mouth. 125-3-40 mg cap  [provider]  furosemide (LASIX) 80 MG tablet TAKE 1 TABLET EVERY DAY 10/10/15   Hoyt Koch, MD  levothyroxine (SYNTHROID, LEVOTHROID) 125 MCG tablet Take as directed Patient taking differently: Take 100 mcg by mouth. Take as directed 07/27/14   Schoenhoff, Altamese Cabal, MD  pimecrolimus (ELIDEL) 1 % cream Apply topically 2 (two) times daily as needed.    [provider]  potassium chloride (MICRO-K) 10 MEQ CR capsule Take 2 capsules (20 mEq total) by mouth daily. 07/27/14   Schoenhoff, Altamese Cabal, MD    Allergies Patient has no known allergies.   REVIEW OF SYSTEMS  Negative except as noted here or in the History of Present Illness.   PHYSICAL EXAMINATION  Initial Vital Signs Blood pressure (!) 175/54, pulse 62, temperature 98 F (36.7 C), temperature source Oral, resp. rate 18, height 5\' 4"  (1.626 m), weight 81.2 kg, SpO2 100  %.  Examination General: Well-developed, well-nourished female in no acute distress; appearance consistent with age of record HENT: normocephalic; left occipital hematoma; no hemotympanum; right periorbital hematoma; laceration to chin:      Eyes: pupils equal, round and reactive to light; extraocular muscles intact Neck: supple; nontender Heart: regular rate and rhythm Lungs: clear to auscultation bilaterally Abdomen: soft; nondistended; nontender; bowel sounds present Extremities: No deformity; full range of motion; pulses normal Neurologic: Awake, alert and oriented; motor function intact in all extremities and symmetric; no facial droop Skin: Warm and dry Psychiatric: Normal mood and affect   RESULTS  Summary of this visit's results, reviewed and interpreted by myself:   EKG Interpretation  Date/Time:  Friday July 24 2019 23:54:19 EDT Ventricular Rate:  58 PR Interval:    QRS Duration: 113 QT Interval:  454 QTC Calculation: 446 R Axis:   33 Text Interpretation: Sinus rhythm Borderline intraventricular conduction delay Low voltage, precordial leads No significant change was found Confirmed by Kehinde Totzke [phone]) on 07/25/2019 12:04:28 AM      Laboratory Studies: No results found for this or any previous visit (from the past 24 hour(s)). Imaging Studies: CT Head Wo Contrast  Result Date: 07/25/2019 CLINICAL DATA:  Head trauma EXAM: CT HEAD WITHOUT CONTRAST TECHNIQUE: Contiguous axial images were obtained from the base of the skull through the vertex without intravenous contrast. COMPARISON:  None. FINDINGS: Brain: No evidence of acute territorial infarction, hemorrhage, hydrocephalus,extra-axial collection or mass lesion/mass effect. There is dilatation the ventricles and sulci consistent with age-related atrophy. Low-attenuation changes in the deep white matter consistent with small vessel ischemia. Vascular: No hyperdense vessel or unexpected calcification. Skull: The  skull is intact. No fracture or focal lesion identified. Sinuses/Orbits: The visualized paranasal sinuses and mastoid air cells are clear. The orbits and globes intact. Other: None Face: Osseous: No acute fracture or other significant osseous abnormality.The nasal bone, mandibles, zygomatic arches and pterygoid plates are intact. Orbits: No fracture identified. Unremarkable appearance of globes and orbits. Sinuses: The visualized paranasal sinuses and mastoid air cells are unremarkable. Soft tissues: Mild soft tissue swelling seen around the right periorbital region. There is also a laceration seen under the inferior mandible with overlying soft tissue swelling. Limited intracranial: No acute findings. IMPRESSION: No acute intracranial abnormality. Findings consistent with age related atrophy and chronic small vessel ischemia Mild soft tissue swelling seen around the right orbit. Soft tissue laceration and swelling underlying the mandible. Electronically Signed   By: Prudencio Pair M.D.   On: 07/25/2019 00:42   CT Maxillofacial Wo Contrast  Result Date: 07/25/2019  CLINICAL DATA:  Head trauma EXAM: CT HEAD WITHOUT CONTRAST TECHNIQUE: Contiguous axial images were obtained from the base of the skull through the vertex without intravenous contrast. COMPARISON:  None. FINDINGS: Brain: No evidence of acute territorial infarction, hemorrhage, hydrocephalus,extra-axial collection or mass lesion/mass effect. There is dilatation the ventricles and sulci consistent with age-related atrophy. Low-attenuation changes in the deep white matter consistent with small vessel ischemia. Vascular: No hyperdense vessel or unexpected calcification. Skull: The skull is intact. No fracture or focal lesion identified. Sinuses/Orbits: The visualized paranasal sinuses and mastoid air cells are clear. The orbits and globes intact. Other: None Face: Osseous: No acute fracture or other significant osseous abnormality.The nasal bone, mandibles,  zygomatic arches and pterygoid plates are intact. Orbits: No fracture identified. Unremarkable appearance of globes and orbits. Sinuses: The visualized paranasal sinuses and mastoid air cells are unremarkable. Soft tissues: Mild soft tissue swelling seen around the right periorbital region. There is also a laceration seen under the inferior mandible with overlying soft tissue swelling. Limited intracranial: No acute findings. IMPRESSION: No acute intracranial abnormality. Findings consistent with age related atrophy and chronic small vessel ischemia Mild soft tissue swelling seen around the right orbit. Soft tissue laceration and swelling underlying the mandible. Electronically Signed   By: Prudencio Pair M.D.   On: 07/25/2019 00:42    ED COURSE and MDM  Nursing notes, initial and subsequent vitals signs, including pulse oximetry, reviewed and interpreted by myself.  Vitals:   07/24/19 2350 07/24/19 2352 07/25/19 0100  BP:  (!) 175/54 (!) 117/49  Pulse:  62 (!) 59  Resp:  18 18  Temp:  98 F (36.7 C)   TempSrc:  Oral   SpO2:  100% 100%  Weight: 81.2 kg    Height: 5\' 4"  (1.626 m)     Medications  lidocaine-EPINEPHrine (XYLOCAINE W/EPI) 2 %-1:200000 (PF) injection 10 mL (has no administration in time range)  sodium chloride 0.9 % bolus 1,000 mL (has no administration in time range)  Tdap (BOOSTRIX) injection 0.5 mL (has no administration in time range)   I suspect the patient had a syncopal episode as a result of giving blood yesterday.  She has never had this happen before.  She could have had a seizure but this appears less likely and she did not bite her tongue or have incontinence.  We will give her a liter of fluid bolus in the ED and update her tetanus.  PROCEDURES  Procedures  LACERATION REPAIR Performed by: Karen Chafe Shaana Acocella Authorized by: Karen Chafe Mika Anastasi Consent: Verbal consent obtained. Risks and benefits: risks, benefits and alternatives were discussed Consent given by:  patient Patient identity confirmed: provided demographic data Prepped and Draped in normal sterile fashion Wound explored  Laceration Location: Chin  Laceration Length: 2.5 cm  No Foreign Bodies seen or palpated  Anesthesia: local infiltration  Local anesthetic: lidocaine 2% with epinephrine  Anesthetic total: 4 ml  Irrigation method: syringe Amount of cleaning: standard  Skin closure: 5-0 Ethilon  Number of sutures: 6  Technique: Simple interrupted  Patient tolerance: Patient tolerated the procedure well with no immediate complications.     ED DIAGNOSES     ICD-10-CM   1. Syncope and collapse  R55   2. Periorbital hematoma of right eye  H05.231   3. Hematoma of occipital surface of head, initial encounter  S00.83XA   4. Laceration of skin of chin, initial encounter  S01.81XA        Kanae Ignatowski, Jenny Reichmann, MD 07/25/19 [phone]

## 2019-07-25 NOTE — ED Notes (Signed)
MD aware of pt.

## 2019-09-08 ENCOUNTER — Ambulatory Visit: Payer: Medicare Other | Admitting: Internal Medicine

## 2019-09-08 ENCOUNTER — Other Ambulatory Visit: Payer: Self-pay

## 2019-09-08 ENCOUNTER — Encounter: Payer: Self-pay | Admitting: Internal Medicine

## 2019-09-08 DIAGNOSIS — R55 Syncope and collapse: Secondary | ICD-10-CM

## 2019-09-08 NOTE — Patient Instructions (Addendum)
Medication Instructions:  Your physician recommends that you continue on your current medications as directed. Please refer to the Current Medication list given to you today.  Labwork: None ordered.  Testing/Procedures: Your physician has requested that you have an echocardiogram. Echocardiography is a painless test that uses sound waves to create images of your heart. It provides your doctor with information about the size and shape of your heart and how well your heart's chambers and valves are working. This procedure takes approximately one hour. There are no restrictions for this procedure.  Please schedule for ECHO.  Follow-Up: Your physician wants you to follow-up in: as needed with Dr. Gwendel Hanson up based on results of ECHO.  Any Other Special Instructions Will Be Listed Below (If Applicable).  If you need a refill on your cardiac medications before your next appointment, please call your pharmacy.

## 2019-09-08 NOTE — Progress Notes (Signed)
HPI Allison [name] is referred today by Dr. Fara Olden for evaluation of syncope and found on cardiac monitoring to have brief episodes of atrial tachycardia. The patient is a pleasant 74 yo woman with a h/o HTN, dyslipidemia, and obesity. She has fatty liver disease. She had a syncopal episode in April and got a lac on her chin which was sewed up. She did not have any warning and did not lose continence. She did not bite her tongue. She had given blood earlier that day but had eaten after and had not exercised. She had no warning that anything was wrong before the spell and felt ok after except for the bleeding. No palpitations.   No Known Allergies   Current Outpatient Medications  Medication Sig Dispense Refill  . albuterol (PROVENTIL HFA;VENTOLIN HFA) 108 (90 BASE) MCG/ACT inhaler Take 2 inhalations tid as needed 1 Inhaler 1  . atorvastatin (LIPITOR) 20 MG tablet TAKE 1 TABLET EVERY DAY 90 tablet 3  . Calcium Carbonate-Vit D-Min (CALCIUM 1200 PO) Take 1 tablet by mouth daily.      . Cholecalciferol (VITAMIN D3) 5000 UNITS TABS Take 2,000 Units by mouth daily.     . diclofenac sodium (VOLTAREN) 1 % GEL Apply 2-4 g topically 4 (four) times daily. 5 Tube 1  . Fe Fum-FePoly-Vit C-Vit B3 (INTEGRA PO) Take by mouth. 125-3-40 mg cap    . furosemide (LASIX) 80 MG tablet TAKE 1 TABLET EVERY DAY 90 tablet 3  . levothyroxine (SYNTHROID, LEVOTHROID) 125 MCG tablet Take as directed (Patient taking differently: Take 100 mcg by mouth. Take as directed) 90 tablet 3  . pimecrolimus (ELIDEL) 1 % cream Apply topically 2 (two) times daily as needed.    . potassium chloride (MICRO-K) 10 MEQ CR capsule Take 2 capsules (20 mEq total) by mouth daily. 180 capsule 3   No current facility-administered medications for this visit.     Past Medical History:  Diagnosis Date  . Anemia   . Asthmatic bronchitis   . Carpal tunnel syndrome   . Diverticulosis   . DJD (degenerative joint disease) of hip    s/P R  hip replacement  . Elevated serum alkaline phosphatase level   . Fatty liver disease, nonalcoholic   . GERD (gastroesophageal reflux disease)   . Hyperlipidemia   . Hypertension   . Internal hemorrhoids   . Obesity   . Osteoarthritis   . Osteopenia   . Post-operative nausea and vomiting   . Prediabetes   . Thyroid disease    hypo  . Vitamin D deficiency     ROS:   All systems reviewed and negative except as noted in the HPI.   Past Surgical History:  Procedure Laterality Date  . CARPAL TUNNEL RELEASE Bilateral   . DENTAL SURGERY    . JOINT REPLACEMENT  2004   R hip  . [place], 2008   2 left and 1 right  . TOE FUSION  2000's     Family History  Problem Relation Age of Onset  . HIV Brother   . Colon cancer Mother 80  . Heart disease Father   . Ovarian cancer Maternal Aunt 58       lung cancer     Social History   Socioeconomic History  . Marital status: Single    Spouse name: Not on file  . Number of children: 0  . Years of education: Not on file  . Highest education  level: Not on file  Occupational History  . Occupation: Retired  Tobacco Use  . Smoking status: Former Smoker    Packs/day: 2.00    Years: 20.00    Pack years: 40.00    Types: Cigarettes    Quit date: 04/03/1983    Years since quitting: 36.4  . Smokeless tobacco: Never Used  Substance and Sexual Activity  . Alcohol use: Yes    Alcohol/week: 7.0 standard drinks    Types: 7 Standard drinks or equivalent per week    Comment: drinks on weekends per pt  . Drug use: No  . Sexual activity: Never    Birth control/protection: Post-menopausal  Other Topics Concern  . Not on file  Social History Narrative  . Not on file   Social Determinants of Health   Financial Resource Strain:   . Difficulty of Paying Living Expenses:   Food Insecurity:   . Worried About Charity fundraiser in the Last Year:   . Arboriculturist in the Last Year:   Transportation Needs:   .  Film/video editor (Medical):   Marland Kitchen Lack of Transportation (Non-Medical):   Physical Activity:   . Days of Exercise per Week:   . Minutes of Exercise per Session:   Stress:   . Feeling of Stress :   Social Connections:   . Frequency of Communication with Friends and Family:   . Frequency of Social Gatherings with Friends and Family:   . Attends Religious Services:   . Active Member of Clubs or Organizations:   . Attends Archivist Meetings:   Marland Kitchen Marital Status:   Intimate Partner Violence:   . Fear of Current or Ex-Partner:   . Emotionally Abused:   Marland Kitchen Physically Abused:   . Sexually Abused:      Ht 5\' 4"  (1.626 m)   Wt 180 lb 9.6 oz (81.9 kg)   BMI 31.00 kg/m   Physical Exam:  Well appearing NAD HEENT: Unremarkable Neck:  No JVD, no thyromegally Lymphatics:  No adenopathy Back:  No CVA tenderness Lungs:  Clear with no wheezes HEART:  Regular rate rhythm, no murmurs, no rubs, no clicks Abd:  soft, positive bowel sounds, no organomegally, no rebound, no guarding Ext:  2 plus pulses, no edema, no cyanosis, no clubbing Skin:  No rashes no nodules Neuro:  CN II through XII intact, motor grossly intact  EKG - nsr   Assess/Plan: 1. Syncope - the etiology is unclear. Her spells do not sound vagal. I have asked her to obtain a 2D echo and we will reassess. 2. NS atrial tachycardia - she is asymptomatic. I do not think that these are related to her syncope. No treatment 3. HTN - her bp is up a bit but we will hold off on aggressively treatment out of concern for her prior episode of syncope.   Mikle Bosworth.D.

## 2019-09-30 ENCOUNTER — Other Ambulatory Visit: Payer: Self-pay

## 2019-09-30 ENCOUNTER — Ambulatory Visit (HOSPITAL_COMMUNITY): Payer: Medicare Other | Attending: Cardiovascular Disease

## 2019-09-30 DIAGNOSIS — R55 Syncope and collapse: Secondary | ICD-10-CM | POA: Insufficient documentation

## 2019-10-06 ENCOUNTER — Telehealth: Payer: Self-pay

## 2019-10-06 NOTE — Telephone Encounter (Signed)
-----   Message from Damian Leavell, RN sent at 09/11/2019  8:50 AM EDT ----- Regarding: f/u on echo results 6/30

## 2019-10-06 NOTE — Telephone Encounter (Signed)
Call placed to Pt.  Advised her ECHO results were good, pumping function WNL.  Per Dr. Estanislado Pandy ECHO was good he would advise loop recorder vs. Watchful waiting.  Discussed with Pt.  At this time she would like to wait and see how she does.  She will call office if she has any further needs.

## 2019-12-31 ENCOUNTER — Telehealth: Payer: Self-pay

## 2019-12-31 ENCOUNTER — Ambulatory Visit: Payer: Self-pay

## 2019-12-31 ENCOUNTER — Ambulatory Visit (INDEPENDENT_AMBULATORY_CARE_PROVIDER_SITE_OTHER): Payer: Medicare Other | Admitting: Orthopaedic Surgery

## 2019-12-31 ENCOUNTER — Other Ambulatory Visit: Payer: Self-pay

## 2019-12-31 ENCOUNTER — Encounter: Payer: Self-pay | Admitting: Orthopaedic Surgery

## 2019-12-31 VITALS — Ht 64.0 in | Wt 175.0 lb

## 2019-12-31 DIAGNOSIS — M170 Bilateral primary osteoarthritis of knee: Secondary | ICD-10-CM | POA: Insufficient documentation

## 2019-12-31 DIAGNOSIS — G8929 Other chronic pain: Secondary | ICD-10-CM | POA: Diagnosis not present

## 2019-12-31 DIAGNOSIS — M25562 Pain in left knee: Secondary | ICD-10-CM | POA: Diagnosis not present

## 2019-12-31 MED ORDER — BUPIVACAINE HCL 0.5 % IJ SOLN
2.0000 mL | INTRAMUSCULAR | Status: AC | PRN
  Administered 2019-12-31: 2 mL via INTRA_ARTICULAR

## 2019-12-31 MED ORDER — METHYLPREDNISOLONE ACETATE 40 MG/ML IJ SUSP
80.0000 mg | INTRAMUSCULAR | Status: AC | PRN
  Administered 2019-12-31: 80 mg via INTRA_ARTICULAR

## 2019-12-31 MED ORDER — LIDOCAINE HCL 1 % IJ SOLN
2.0000 mL | INTRAMUSCULAR | Status: AC | PRN
  Administered 2019-12-31: 2 mL

## 2019-12-31 NOTE — Telephone Encounter (Signed)
Please precert for left knee visco injections. This is Dr.Whitfield's patient. Thanks! 

## 2019-12-31 NOTE — Progress Notes (Addendum)
Office Visit Note   Patient: Allison [name]           Date of Birth: 03/06/1946           MRN: [id number] Visit Date: 12/31/2019              Requested by: Leeroy Cha, MD [address] [place],  [place] [id number] PCP: Leeroy Cha, MD   Assessment & Plan: Visit Diagnoses:  1. Chronic pain of left knee   2. Bilateral primary osteoarthritis of knee     Plan: Progressive osteoarthritis left knee.  Long discussion regarding diagnosis and treatment options.  Will inject the knee with cortisone today and pre-CERT Visco supplementation.  Have also discussed knee replacement but [name] would prefer to wait to see if the injections will help  Follow-Up Instructions: Return Pre-CERT Visco supplementation.   Orders:  Orders Placed This Encounter  Procedures  . XR KNEE 3 VIEW LEFT   No orders of the defined types were placed in this encounter.     Procedures: Large Joint Inj: L knee on 12/31/2019 2:57 PM Indications: pain and diagnostic evaluation Details: 25 G 1.5 in needle, anteromedial approach  Arthrogram: No  Medications: 2 mL lidocaine 1 %; 2 mL bupivacaine 0.5 %; 80 mg methylPREDNISolone acetate 40 MG/ML Procedure, treatment alternatives, risks and benefits explained, specific risks discussed. Consent was given by the patient. Patient was prepped and draped in the usual sterile fashion.       Clinical Data: No additional findings.   Subjective: Chief Complaint  Patient presents with  . Left Knee - Pain  Patient presents today for her left knee pain. She was here last in 2019 and injected with cortisone. She states that the injection only lasted 2-3 weeks. She is having pain all throughout her knee. She states that her knee feels tight, weak, and grinds. She has difficulty going down stairs. Her knee aches into her ankle and wakes her at night. She takes Tylenol for pain relief. No previous knee surgery.   HPI  Review of Systems    Constitutional: Negative for fatigue.  HENT: Negative for ear pain.   Eyes: Negative for pain.  Respiratory: Negative for shortness of breath.   Cardiovascular: Negative for leg swelling.  Gastrointestinal: Negative for constipation and diarrhea.  Endocrine: Negative for cold intolerance and heat intolerance.  Genitourinary: Negative for difficulty urinating.  Musculoskeletal: Positive for joint swelling.  Skin: Negative for rash.  Allergic/Immunologic: Negative for food allergies.  Neurological: Positive for weakness.  Hematological: Does not bruise/bleed easily.  Psychiatric/Behavioral: Positive for sleep disturbance.     Objective: Vital Signs: Ht 5\' 4"  (1.626 m)   Wt 175 lb (79.4 kg)   BMI 30.04 kg/m   Physical Exam Constitutional:      Appearance: She is well-developed.  Eyes:     Pupils: Pupils are equal, round, and reactive to light.  Pulmonary:     Effort: Pulmonary effort is normal.  Skin:    General: Skin is warm and dry.  Neurological:     Mental Status: She is alert and oriented to person, place, and time.  Psychiatric:        Behavior: Behavior normal.     Ortho Exam awake alert and oriented x3.  Comfortable sitting.  Full extension left knee with flexion about 100 degrees.  No instability.  Having both medial and lateral joint pain.  No obvious increased varus of valgus with weightbearing.  Some patella crepitation  but no pain with patella compression.  No popliteal pain or mass.  No calf pain.  Motor exam intact.  Straight leg raise negative.  Painless range of motion left hip.  Skin intact Specialty Comments:  No specialty comments available.  Imaging: XR KNEE 3 VIEW LEFT  Result Date: 12/31/2019 Films of the left knee were obtained in 3 projections standing and compared to films performed in 2019.  There is progression of the tricompartmental degenerative changes particularly in the lateral compartment where there is more narrowing and larger  peripheral osteophytes.  There may be some calcification within the menisci consistent with CPPD.  There are degenerative changes in the lateral and patellofemoral compartments as well.  No acute changes    PMFS History: Patient Active Problem List   Diagnosis Date Noted  . Bilateral primary osteoarthritis of knee 12/31/2019  . Syncope and collapse 09/08/2019  . Chronic pain of right knee 03/28/2016  . Unilateral primary osteoarthritis, right knee 03/28/2016  . Chondrocalcinosis of right knee 03/28/2016  . Acute bronchitis 03/30/2015  . HTN (hypertension) 03/04/2013  . Osteopenia 03/04/2013  . Dyspnea 01/20/2013  . Bronchospasm, acute 11/22/2011  . Urticaria 11/22/2011  . Family history of colon cancer 09/09/2011  . Obesity 09/09/2011  . Hypokalemia 06/18/2011  . Hyperlipidemia   . Thyroid disease   . Fatty liver disease, nonalcoholic   . DJD (degenerative joint disease) of hip    Past Medical History:  Diagnosis Date  . Anemia   . Asthmatic bronchitis   . Carpal tunnel syndrome   . Diverticulosis   . DJD (degenerative joint disease) of hip    s/P R hip replacement  . Elevated serum alkaline phosphatase level   . Fatty liver disease, nonalcoholic   . GERD (gastroesophageal reflux disease)   . Hyperlipidemia   . Hypertension   . Internal hemorrhoids   . Obesity   . Osteoarthritis   . Osteopenia   . Post-operative nausea and vomiting   . Prediabetes   . Thyroid disease    hypo  . Vitamin D deficiency     Family History  Problem Relation Age of Onset  . HIV Brother   . Colon cancer Mother 24  . Heart disease Father   . Ovarian cancer Maternal Aunt 58       lung cancer    Past Surgical History:  Procedure Laterality Date  . CARPAL TUNNEL RELEASE Bilateral   . DENTAL SURGERY    . JOINT REPLACEMENT  2004   R hip  . [place], 2008   2 left and 1 right  . TOE FUSION  2000's   Social History   Occupational History  . Occupation:  Retired  Tobacco Use  . Smoking status: Former Smoker    Packs/day: 2.00    Years: 20.00    Pack years: 40.00    Types: Cigarettes    Quit date: 04/03/1983    Years since quitting: 36.7  . Smokeless tobacco: Never Used  Vaping Use  . Vaping Use: Never used  Substance and Sexual Activity  . Alcohol use: Yes    Alcohol/week: 7.0 standard drinks    Types: 7 Standard drinks or equivalent per week    Comment: drinks on weekends per pt  . Drug use: No  . Sexual activity: Never    Birth control/protection: Post-menopausal

## 2020-01-01 NOTE — Telephone Encounter (Signed)
Noted  

## 2020-01-08 ENCOUNTER — Telehealth: Payer: Self-pay

## 2020-01-08 NOTE — Telephone Encounter (Signed)
Submitted VOB, Gelsyn-3, left knee.

## 2020-02-10 ENCOUNTER — Other Ambulatory Visit: Payer: Self-pay

## 2020-02-10 ENCOUNTER — Encounter: Payer: Self-pay | Admitting: Orthopaedic Surgery

## 2020-02-10 ENCOUNTER — Ambulatory Visit: Payer: Self-pay

## 2020-02-10 ENCOUNTER — Ambulatory Visit (INDEPENDENT_AMBULATORY_CARE_PROVIDER_SITE_OTHER): Payer: Medicare Other | Admitting: Orthopaedic Surgery

## 2020-02-10 VITALS — Ht 64.0 in | Wt 175.0 lb

## 2020-02-10 DIAGNOSIS — M25551 Pain in right hip: Secondary | ICD-10-CM

## 2020-02-10 DIAGNOSIS — M79671 Pain in right foot: Secondary | ICD-10-CM | POA: Diagnosis not present

## 2020-02-10 DIAGNOSIS — M25571 Pain in right ankle and joints of right foot: Secondary | ICD-10-CM

## 2020-02-10 NOTE — Progress Notes (Signed)
Office Visit Note   Patient: Allison [name]           Date of Birth: Nov 29, 1945           MRN: [id number] Visit Date: 02/10/2020              Requested by: Allison Cha, MD [address] [place],  [place] [address] PCP: Allison Cha, MD   Assessment & Plan: Visit Diagnoses:  1. Pain in right ankle and joints of right foot   2. Pain in right hip   3. Pain in right foot     Plan: Allison [name] recently had onset of lateral right hip pain and right foot and ankle discomfort after doing a lot of work around her house.  Discomfort occurred within 2 or 3 days.  Her pain is localized in her hip around the greater trochanter.  I suspect she has a greater trochanteric bursitis or gluteal I inflammation.  I discussed use of Voltaren gel.  Like to see her back in 4 to 5 weeks if no improvement consider local cortisone injection as her pain is so localized.  She has had a prior right hip replacement but doesn't have any groin or thigh pain.  She also had pain in the area of her right ankle and midfoot after the above activity.  There is some evidence of very mild insertional Achilles tendinitis.  She might even have a mild sprain of the lateral ankle.  There is definitely some midfoot arthritis.   Will place her in an equalizer boot and check her back in 3 to 4 weeks if no improvement  Follow-Up Instructions: Return if symptoms worsen or fail to improve.   Orders:  Orders Placed This Encounter  Procedures  . XR Ankle 2 Views Right   No orders of the defined types were placed in this encounter.     Procedures: No procedures performed   Clinical Data: No additional findings.   Subjective: Chief Complaint  Patient presents with  . Right Hip - Pain  . Right Foot - Pain  Patient presents today for right foot and right hip pain. She said that it started after climbing up and down a ladder to clean her windows a month ago. She said it started with pain in her  right heel and lateral foot. She said that it hurts to bear weight and feels like a "bone bruise". She also has been having lateral right hip and buttock pain. She has no groin pain, numbness, or tingling. She also states that her hip has been locking up. She has a history of a right total hip arthroplasty in 2014 with Dr.Zoey [name].   HPI  Review of Systems   Objective: Vital Signs: Ht 5\' 4"  (1.626 m)   Wt 175 lb (79.4 kg)   BMI 30.04 kg/m   Physical Exam Constitutional:      Appearance: She is well-developed.  Eyes:     Pupils: Pupils are equal, round, and reactive to light.  Pulmonary:     Effort: Pulmonary effort is normal.  Skin:    General: Skin is warm and dry.  Neurological:     Mental Status: She is alert and oriented to person, place, and time.  Psychiatric:        Behavior: Behavior normal.     Ortho Exam awake alert and oriented x3.  Comfortable sitting.  Very minimal discomfort over the anterior talofibular ligament right foot with maybe a small ganglion  cyst in the same area.  Skin is intact.  Good capillary refill to toes with good pulses.  Very minimal discomfort at the insertion of the Achilles tendon.  Achilles tendon is intact.  No instability.  Mild pain along the talonavicular navicular first cuneiform bones the dorsum of the midfoot.  Some limitation of ankle motion but no particular pain  Specialty Comments:  No specialty comments available.  Imaging: XR Ankle 2 Views Right  Result Date: 02/10/2020 Films of the right ankle obtained in 2 projections standing.  Obvious degenerative changes in the midfoot with sagging of the talonavicular and navicular first cuneiform joints.  Prominence of the posterior angle of the os calcis with a very small posterior heel spur.  Prominent plantar heel spur which is not symptomatic.  Mild changes of degenerative arthritis at the ankle.  No acute changes    PMFS History: Patient Active Problem List   Diagnosis Date  Noted  . Pain in right hip 02/10/2020  . Pain in right foot 02/10/2020  . Bilateral primary osteoarthritis of knee 12/31/2019  . Syncope and collapse 09/08/2019  . Chronic pain of right knee 03/28/2016  . Unilateral primary osteoarthritis, right knee 03/28/2016  . Chondrocalcinosis of right knee 03/28/2016  . Acute bronchitis 03/30/2015  . HTN (hypertension) 03/04/2013  . Osteopenia 03/04/2013  . Dyspnea 01/20/2013  . Bronchospasm, acute 11/22/2011  . Urticaria 11/22/2011  . Family history of colon cancer 09/09/2011  . Obesity 09/09/2011  . Hypokalemia 06/18/2011  . Hyperlipidemia   . Thyroid disease   . Fatty liver disease, nonalcoholic   . DJD (degenerative joint disease) of hip    Past Medical History:  Diagnosis Date  . Anemia   . Asthmatic bronchitis   . Carpal tunnel syndrome   . Diverticulosis   . DJD (degenerative joint disease) of hip    s/P R hip replacement  . Elevated serum alkaline phosphatase level   . Fatty liver disease, nonalcoholic   . GERD (gastroesophageal reflux disease)   . Hyperlipidemia   . Hypertension   . Internal hemorrhoids   . Obesity   . Osteoarthritis   . Osteopenia   . Post-operative nausea and vomiting   . Prediabetes   . Thyroid disease    hypo  . Vitamin D deficiency     Family History  Problem Relation Age of Onset  . HIV Brother   . Colon cancer Mother 83  . Heart disease Father   . Ovarian cancer Maternal Aunt 58       lung cancer    Past Surgical History:  Procedure Laterality Date  . CARPAL TUNNEL RELEASE Bilateral   . DENTAL SURGERY    . JOINT REPLACEMENT  2004   R hip  . [place], 2008   2 left and 1 right  . TOE FUSION  2000's   Social History   Occupational History  . Occupation: Retired  Tobacco Use  . Smoking status: Former Smoker    Packs/day: 2.00    Years: 20.00    Pack years: 40.00    Types: Cigarettes    Quit date: 04/03/1983    Years since quitting: 36.8  . Smokeless  tobacco: Never Used  Vaping Use  . Vaping Use: Never used  Substance and Sexual Activity  . Alcohol use: Yes    Alcohol/week: 7.0 standard drinks    Types: 7 Standard drinks or equivalent per week    Comment: drinks on weekends per  pt  . Drug use: No  . Sexual activity: Never    Birth control/protection: Post-menopausal

## 2020-02-29 ENCOUNTER — Telehealth: Payer: Self-pay

## 2020-02-29 NOTE — Telephone Encounter (Signed)
Called and left a VM advising patient to call to schedule an appointment with Dr. Durward Fortes for gel injection.  Approved, Gelsyn-3, left knee. [place] Patient will be responsible for 20% OOP. Co-pay of $30.00 per visit No PA required

## 2020-04-26 ENCOUNTER — Other Ambulatory Visit: Payer: Self-pay | Admitting: Internal Medicine

## 2020-04-26 DIAGNOSIS — Z1231 Encounter for screening mammogram for malignant neoplasm of breast: Secondary | ICD-10-CM

## 2020-04-26 DIAGNOSIS — H2513 Age-related nuclear cataract, bilateral: Secondary | ICD-10-CM | POA: Diagnosis not present

## 2020-04-28 ENCOUNTER — Ambulatory Visit: Payer: Medicare Other | Admitting: Orthopaedic Surgery

## 2020-04-28 ENCOUNTER — Other Ambulatory Visit: Payer: Self-pay

## 2020-04-28 ENCOUNTER — Encounter: Payer: Self-pay | Admitting: Orthopaedic Surgery

## 2020-04-28 DIAGNOSIS — M1712 Unilateral primary osteoarthritis, left knee: Secondary | ICD-10-CM | POA: Diagnosis not present

## 2020-04-28 DIAGNOSIS — M170 Bilateral primary osteoarthritis of knee: Secondary | ICD-10-CM

## 2020-04-28 MED ORDER — SODIUM HYALURONATE (VISCOSUP) 16.8 MG/2ML IX SOSY
16.8000 mg | PREFILLED_SYRINGE | INTRA_ARTICULAR | Status: AC | PRN
  Administered 2020-04-28: 16.8 mg via INTRA_ARTICULAR

## 2020-04-28 NOTE — Progress Notes (Signed)
Office Visit Note   Patient: Allison [name]           Date of Birth: 1945-10-31           MRN: [id number] Visit Date: 04/28/2020              Requested by: Leeroy Cha, MD [address] [place],  [place] [address] PCP: Leeroy Cha, MD   Assessment & Plan: Visit Diagnoses:  1. Bilateral primary osteoarthritis of knee     Plan: First GELSYN-3 viscosupplementation injected the medial compartment left knee.  Return weekly for the next 2 weeks to complete the series  Follow-Up Instructions: Return in about 1 week (around 05/05/2020).   Orders:  No orders of the defined types were placed in this encounter.  No orders of the defined types were placed in this encounter.     Procedures: Large Joint Inj: L knee on 04/28/2020 1:38 PM Indications: pain and joint swelling Details: 25 G 1.5 in needle, anteromedial approach  Arthrogram: No  Medications: 16.8 mg Sodium Hyaluronate (Viscosup) 16.8 MG/2ML Outcome: tolerated well, no immediate complications Procedure, treatment alternatives, risks and benefits explained, specific risks discussed. Consent was given by the patient. Immediately prior to procedure a time out was called to verify the correct patient, procedure, equipment, support staff and site/side marked as required. Patient was prepped and draped in the usual sterile fashion.       Clinical Data: No additional findings.   Subjective: Chief Complaint  Patient presents with  . Left Knee - Follow-up  Has been approved for viscosupplementation left knee.  We will start the injections today  HPI  Review of Systems   Objective: Vital Signs: There were no vitals taken for this visit.  Physical Exam  Ortho Exam left knee was not hot red warm or swollen.  No effusion.  Does have some mild to moderate medial joint pain consistent with arthritis  Specialty Comments:  No specialty comments available.  Imaging: No results  found.   PMFS History: Patient Active Problem List   Diagnosis Date Noted  . Pain in right hip 02/10/2020  . Pain in right foot 02/10/2020  . Bilateral primary osteoarthritis of knee 12/31/2019  . Syncope and collapse 09/08/2019  . Chronic pain of right knee 03/28/2016  . Unilateral primary osteoarthritis, right knee 03/28/2016  . Chondrocalcinosis of right knee 03/28/2016  . Acute bronchitis 03/30/2015  . HTN (hypertension) 03/04/2013  . Osteopenia 03/04/2013  . Dyspnea 01/20/2013  . Bronchospasm, acute 11/22/2011  . Urticaria 11/22/2011  . Family history of colon cancer 09/09/2011  . Obesity 09/09/2011  . Hypokalemia 06/18/2011  . Hyperlipidemia   . Thyroid disease   . Fatty liver disease, nonalcoholic   . DJD (degenerative joint disease) of hip    Past Medical History:  Diagnosis Date  . Anemia   . Asthmatic bronchitis   . Carpal tunnel syndrome   . Diverticulosis   . DJD (degenerative joint disease) of hip    s/P R hip replacement  . Elevated serum alkaline phosphatase level   . Fatty liver disease, nonalcoholic   . GERD (gastroesophageal reflux disease)   . Hyperlipidemia   . Hypertension   . Internal hemorrhoids   . Obesity   . Osteoarthritis   . Osteopenia   . Post-operative nausea and vomiting   . Prediabetes   . Thyroid disease    hypo  . Vitamin D deficiency     Family History  Problem  Relation Age of Onset  . HIV Brother   . Colon cancer Mother 76  . Heart disease Father   . Ovarian cancer Maternal Aunt 58       lung cancer    Past Surgical History:  Procedure Laterality Date  . CARPAL TUNNEL RELEASE Bilateral   . DENTAL SURGERY    . JOINT REPLACEMENT  2004   R hip  . [place], 2008   2 left and 1 right  . TOE FUSION  2000's   Social History   Occupational History  . Occupation: Retired  Tobacco Use  . Smoking status: Former Smoker    Packs/day: 2.00    Years: 20.00    Pack years: 40.00    Types: Cigarettes     Quit date: 04/03/1983    Years since quitting: 37.0  . Smokeless tobacco: Never Used  Vaping Use  . Vaping Use: Never used  Substance and Sexual Activity  . Alcohol use: Yes    Alcohol/week: 7.0 standard drinks    Types: 7 Standard drinks or equivalent per week    Comment: drinks on weekends per pt  . Drug use: No  . Sexual activity: Never    Birth control/protection: Post-menopausal     Garald Balding, MD   Note - This record has been created using [employer].  Chart creation errors have been sought, but may not always  have been located. Such creation errors do not reflect on  the standard of medical care.

## 2020-05-05 ENCOUNTER — Other Ambulatory Visit: Payer: Self-pay

## 2020-05-05 ENCOUNTER — Encounter: Payer: Self-pay | Admitting: Orthopaedic Surgery

## 2020-05-05 ENCOUNTER — Ambulatory Visit: Payer: Medicare Other | Admitting: Orthopaedic Surgery

## 2020-05-05 DIAGNOSIS — M1712 Unilateral primary osteoarthritis, left knee: Secondary | ICD-10-CM

## 2020-05-05 DIAGNOSIS — M170 Bilateral primary osteoarthritis of knee: Secondary | ICD-10-CM

## 2020-05-05 MED ORDER — SODIUM HYALURONATE (VISCOSUP) 16.8 MG/2ML IX SOSY
16.8000 mg | PREFILLED_SYRINGE | INTRA_ARTICULAR | Status: AC | PRN
  Administered 2020-05-05: 16.8 mg via INTRA_ARTICULAR

## 2020-05-05 NOTE — Progress Notes (Signed)
Office Visit Note   Patient: Allison [name]           Date of Birth: 1945/06/01           MRN: [id number] Visit Date: 05/05/2020              Requested by: Leeroy Cha, MD [address] [place],  [place] [address] PCP: Leeroy Cha, MD   Assessment & Plan: Visit Diagnoses:  1. Bilateral primary osteoarthritis of knee     Plan: Second Gelsyn injection left knee without a problem.  Return next week to complete the series of 3  Follow-Up Instructions: Return in about 1 week (around 05/12/2020).   Orders:  Orders Placed This Encounter  Procedures  . Large Joint Inj: L knee   No orders of the defined types were placed in this encounter.     Procedures: Large Joint Inj: L knee on 05/05/2020 1:33 PM Indications: pain and joint swelling Details: 25 G 1.5 in needle, anteromedial approach  Arthrogram: No  Medications: 16.8 mg Sodium Hyaluronate (Viscosup) 16.8 MG/2ML Outcome: tolerated well, no immediate complications Procedure, treatment alternatives, risks and benefits explained, specific risks discussed. Consent was given by the patient. Immediately prior to procedure a time out was called to verify the correct patient, procedure, equipment, support staff and site/side marked as required. Patient was prepped and draped in the usual sterile fashion.       Clinical Data: No additional findings.   Subjective: Chief Complaint  Patient presents with  . Left Knee - Follow-up    Gelsyn started 04/28/2020  Patient presents today for the second Gelsyn injection in her left knee. She started the series on 04/28/2020. No complications form the first injection. She is doing well.   HPI  Review of Systems   Objective: Vital Signs: There were no vitals taken for this visit.  Physical Exam  Ortho Exam left knee was not hot red warm or swollen.  No effusion.  Specialty Comments:  No specialty comments available.  Imaging: No results  found.   PMFS History: Patient Active Problem List   Diagnosis Date Noted  . Pain in right hip 02/10/2020  . Pain in right foot 02/10/2020  . Bilateral primary osteoarthritis of knee 12/31/2019  . Syncope and collapse 09/08/2019  . Chronic pain of right knee 03/28/2016  . Unilateral primary osteoarthritis, right knee 03/28/2016  . Chondrocalcinosis of right knee 03/28/2016  . Acute bronchitis 03/30/2015  . HTN (hypertension) 03/04/2013  . Osteopenia 03/04/2013  . Dyspnea 01/20/2013  . Bronchospasm, acute 11/22/2011  . Urticaria 11/22/2011  . Family history of colon cancer 09/09/2011  . Obesity 09/09/2011  . Hypokalemia 06/18/2011  . Hyperlipidemia   . Thyroid disease   . Fatty liver disease, nonalcoholic   . DJD (degenerative joint disease) of hip    Past Medical History:  Diagnosis Date  . Anemia   . Asthmatic bronchitis   . Carpal tunnel syndrome   . Diverticulosis   . DJD (degenerative joint disease) of hip    s/P R hip replacement  . Elevated serum alkaline phosphatase level   . Fatty liver disease, nonalcoholic   . GERD (gastroesophageal reflux disease)   . Hyperlipidemia   . Hypertension   . Internal hemorrhoids   . Obesity   . Osteoarthritis   . Osteopenia   . Post-operative nausea and vomiting   . Prediabetes   . Thyroid disease    hypo  . Vitamin D deficiency  Family History  Problem Relation Age of Onset  . HIV Brother   . Colon cancer Mother 91  . Heart disease Father   . Ovarian cancer Maternal Aunt 58       lung cancer    Past Surgical History:  Procedure Laterality Date  . CARPAL TUNNEL RELEASE Bilateral   . DENTAL SURGERY    . JOINT REPLACEMENT  2004   R hip  . [place], 2008   2 left and 1 right  . TOE FUSION  2000's   Social History   Occupational History  . Occupation: Retired  Tobacco Use  . Smoking status: Former Smoker    Packs/day: 2.00    Years: 20.00    Pack years: 40.00    Types: Cigarettes     Quit date: 04/03/1983    Years since quitting: 37.1  . Smokeless tobacco: Never Used  Vaping Use  . Vaping Use: Never used  Substance and Sexual Activity  . Alcohol use: Yes    Alcohol/week: 7.0 standard drinks    Types: 7 Standard drinks or equivalent per week    Comment: drinks on weekends per pt  . Drug use: No  . Sexual activity: Never    Birth control/protection: Post-menopausal

## 2020-05-12 ENCOUNTER — Encounter: Payer: Self-pay | Admitting: Orthopaedic Surgery

## 2020-05-12 ENCOUNTER — Ambulatory Visit: Payer: Medicare Other | Admitting: Orthopaedic Surgery

## 2020-05-12 ENCOUNTER — Other Ambulatory Visit: Payer: Self-pay

## 2020-05-12 ENCOUNTER — Ambulatory Visit: Payer: Self-pay

## 2020-05-12 VITALS — Ht 64.0 in | Wt 175.0 lb

## 2020-05-12 DIAGNOSIS — G8929 Other chronic pain: Secondary | ICD-10-CM | POA: Diagnosis not present

## 2020-05-12 DIAGNOSIS — M1712 Unilateral primary osteoarthritis, left knee: Secondary | ICD-10-CM

## 2020-05-12 DIAGNOSIS — M25511 Pain in right shoulder: Secondary | ICD-10-CM | POA: Diagnosis not present

## 2020-05-12 DIAGNOSIS — M170 Bilateral primary osteoarthritis of knee: Secondary | ICD-10-CM

## 2020-05-12 MED ORDER — SODIUM HYALURONATE (VISCOSUP) 16.8 MG/2ML IX SOSY
16.8000 mg | PREFILLED_SYRINGE | INTRA_ARTICULAR | Status: AC | PRN
  Administered 2020-05-12: 16.8 mg via INTRA_ARTICULAR

## 2020-05-12 NOTE — Progress Notes (Addendum)
Office Visit Note   Patient: Allison [name]           Date of Birth: 06/25/1945           MRN: [id number] Visit Date: 05/12/2020              Requested by: Leeroy Cha, MD [address] [place],  [place] [address] PCP: Leeroy Cha, MD   Assessment & Plan: Visit Diagnoses:  1. Chronic right shoulder pain   2. Bilateral primary osteoarthritis of knee     Plan: Third and final gel  injection into the left knee.  [name] has had some benefit. Prominence of the right distal clavicle is a bony protrusion (consistent with arthritis of the [facility] joint.  No other bony abnormalities identified by x-ray.  Patient not having any significant pain with the shoulder or loss of motion  Follow-Up Instructions: Return if symptoms worsen or fail to improve.   Orders:  Orders Placed This Encounter  Procedures  . Large Joint Inj: L knee  . XR Shoulder Right   No orders of the defined types were placed in this encounter.     Procedures: Large Joint Inj: L knee on 05/12/2020 1:18 PM Indications: pain and joint swelling Details: 25 G 1.5 in needle, anteromedial approach  Arthrogram: No  Medications: 16.8 mg Sodium Hyaluronate (Viscosup) 16.8 MG/2ML Outcome: tolerated well, no immediate complications Procedure, treatment alternatives, risks and benefits explained, specific risks discussed. Consent was given by the patient. Immediately prior to procedure a time out was called to verify the correct patient, procedure, equipment, support staff and site/side marked as required. Patient was prepped and draped in the usual sterile fashion.       Clinical Data: No additional findings.   Subjective: Chief Complaint  Patient presents with  . Left Knee - Follow-up    Gelsyn started 04/28/2020  Patient presents today for the third Gelsyn injection in her left knee. Patient states that she is doing well. She has noticed improvement.  Also has noted some protuberance  of the right distal clavicle without history of recent injury or trauma.  Has had prior rotator cuff repair and distal clavicle resection but just recently noted the prominence of the clavicle.  She has minimal discomfort. HPI  Review of Systems   Objective: Vital Signs: Ht 5\' 4"  (1.626 m)   Wt 175 lb (79.4 kg)   BMI 30.04 kg/m   Physical Exam Constitutional:      Appearance: She is well-developed and well-nourished.  HENT:     Mouth/Throat:     Mouth: Oropharynx is clear and moist.  Eyes:     Extraocular Movements: EOM normal.     Pupils: Pupils are equal, round, and reactive to light.  Pulmonary:     Effort: Pulmonary effort is normal.  Skin:    General: Skin is warm and dry.  Neurological:     Mental Status: She is alert and oriented to person, place, and time.  Psychiatric:        Mood and Affect: Mood and affect normal.        Behavior: Behavior normal.     Left knee was not hot warm or red.  I proceeded with the third Visco injection. Right shoulder with prominence of the distal clavicle.  Minimal discomfort.  Negative impingement able to place arm overhead.  Good grip and release and good strength.  Biceps appears to be intact.  Well-healed incision over the distal  clavicle from prior resection  Specialty Comments:  No specialty comments available.  Imaging: No results found.   PMFS History: Patient Active Problem List   Diagnosis Date Noted  . Pain in right shoulder 05/12/2020  . Pain in right hip 02/10/2020  . Pain in right foot 02/10/2020  . Bilateral primary osteoarthritis of knee 12/31/2019  . Syncope and collapse 09/08/2019  . Chronic pain of right knee 03/28/2016  . Unilateral primary osteoarthritis, right knee 03/28/2016  . Chondrocalcinosis of right knee 03/28/2016  . Acute bronchitis 03/30/2015  . HTN (hypertension) 03/04/2013  . Osteopenia 03/04/2013  . Dyspnea 01/20/2013  . Bronchospasm, acute 11/22/2011  . Urticaria 11/22/2011  . Family  history of colon cancer 09/09/2011  . Obesity 09/09/2011  . Hypokalemia 06/18/2011  . Hyperlipidemia   . Thyroid disease   . Fatty liver disease, nonalcoholic   . DJD (degenerative joint disease) of hip    Past Medical History:  Diagnosis Date  . Anemia   . Asthmatic bronchitis   . Carpal tunnel syndrome   . Diverticulosis   . DJD (degenerative joint disease) of hip    s/P R hip replacement  . Elevated serum alkaline phosphatase level   . Fatty liver disease, nonalcoholic   . GERD (gastroesophageal reflux disease)   . Hyperlipidemia   . Hypertension   . Internal hemorrhoids   . Obesity   . Osteoarthritis   . Osteopenia   . Post-operative nausea and vomiting   . Prediabetes   . Thyroid disease    hypo  . Vitamin D deficiency     Family History  Problem Relation Age of Onset  . HIV Brother   . Colon cancer Mother 50  . Heart disease Father   . Ovarian cancer Maternal Aunt 58       lung cancer    Past Surgical History:  Procedure Laterality Date  . CARPAL TUNNEL RELEASE Bilateral   . DENTAL SURGERY    . JOINT REPLACEMENT  2004   R hip  . [place], 2008   2 left and 1 right  . TOE FUSION  2000's   Social History   Occupational History  . Occupation: Retired  Tobacco Use  . Smoking status: Former Smoker    Packs/day: 2.00    Years: 20.00    Pack years: 40.00    Types: Cigarettes    Quit date: 04/03/1983    Years since quitting: 37.1  . Smokeless tobacco: Never Used  Vaping Use  . Vaping Use: Never used  Substance and Sexual Activity  . Alcohol use: Yes    Alcohol/week: 7.0 standard drinks    Types: 7 Standard drinks or equivalent per week    Comment: drinks on weekends per pt  . Drug use: No  . Sexual activity: Never    Birth control/protection: Post-menopausal

## 2020-05-13 DIAGNOSIS — L509 Urticaria, unspecified: Secondary | ICD-10-CM | POA: Diagnosis not present

## 2020-05-13 DIAGNOSIS — L739 Follicular disorder, unspecified: Secondary | ICD-10-CM | POA: Diagnosis not present

## 2020-05-13 DIAGNOSIS — E039 Hypothyroidism, unspecified: Secondary | ICD-10-CM | POA: Diagnosis not present

## 2020-05-18 ENCOUNTER — Ambulatory Visit
Admission: RE | Admit: 2020-05-18 | Discharge: 2020-05-18 | Disposition: A | Discharge status: Home / Self Care | Payer: Medicare Other | Source: Ambulatory Visit

## 2020-05-18 ENCOUNTER — Other Ambulatory Visit: Payer: Self-pay

## 2020-05-18 DIAGNOSIS — Z1231 Encounter for screening mammogram for malignant neoplasm of breast: Secondary | ICD-10-CM

## 2020-06-07 DIAGNOSIS — H18413 Arcus senilis, bilateral: Secondary | ICD-10-CM | POA: Diagnosis not present

## 2020-06-07 DIAGNOSIS — H2513 Age-related nuclear cataract, bilateral: Secondary | ICD-10-CM | POA: Diagnosis not present

## 2020-06-07 DIAGNOSIS — H25013 Cortical age-related cataract, bilateral: Secondary | ICD-10-CM | POA: Diagnosis not present

## 2020-06-07 DIAGNOSIS — H2511 Age-related nuclear cataract, right eye: Secondary | ICD-10-CM | POA: Diagnosis not present

## 2020-06-07 DIAGNOSIS — H25043 Posterior subcapsular polar age-related cataract, bilateral: Secondary | ICD-10-CM | POA: Diagnosis not present

## 2020-06-27 DIAGNOSIS — H2511 Age-related nuclear cataract, right eye: Secondary | ICD-10-CM | POA: Diagnosis not present

## 2020-06-28 DIAGNOSIS — H2512 Age-related nuclear cataract, left eye: Secondary | ICD-10-CM | POA: Diagnosis not present

## 2020-07-12 DIAGNOSIS — J45909 Unspecified asthma, uncomplicated: Secondary | ICD-10-CM | POA: Diagnosis not present

## 2020-07-12 DIAGNOSIS — Z1389 Encounter for screening for other disorder: Secondary | ICD-10-CM | POA: Diagnosis not present

## 2020-07-12 DIAGNOSIS — Z0000 Encounter for general adult medical examination without abnormal findings: Secondary | ICD-10-CM | POA: Diagnosis not present

## 2020-07-12 DIAGNOSIS — I10 Essential (primary) hypertension: Secondary | ICD-10-CM | POA: Diagnosis not present

## 2020-07-12 DIAGNOSIS — K760 Fatty (change of) liver, not elsewhere classified: Secondary | ICD-10-CM | POA: Diagnosis not present

## 2020-07-12 DIAGNOSIS — Z7189 Other specified counseling: Secondary | ICD-10-CM | POA: Diagnosis not present

## 2020-07-12 DIAGNOSIS — E7800 Pure hypercholesterolemia, unspecified: Secondary | ICD-10-CM | POA: Diagnosis not present

## 2020-07-12 DIAGNOSIS — M170 Bilateral primary osteoarthritis of knee: Secondary | ICD-10-CM | POA: Diagnosis not present

## 2020-07-12 DIAGNOSIS — N183 Chronic kidney disease, stage 3 unspecified: Secondary | ICD-10-CM | POA: Diagnosis not present

## 2020-07-12 DIAGNOSIS — E039 Hypothyroidism, unspecified: Secondary | ICD-10-CM | POA: Diagnosis not present

## 2020-08-24 DIAGNOSIS — R600 Localized edema: Secondary | ICD-10-CM | POA: Diagnosis not present

## 2020-08-24 DIAGNOSIS — U071 COVID-19: Secondary | ICD-10-CM | POA: Diagnosis not present

## 2020-08-24 DIAGNOSIS — I10 Essential (primary) hypertension: Secondary | ICD-10-CM | POA: Diagnosis not present

## 2020-08-24 DIAGNOSIS — R059 Cough, unspecified: Secondary | ICD-10-CM | POA: Diagnosis not present

## 2020-08-24 DIAGNOSIS — J069 Acute upper respiratory infection, unspecified: Secondary | ICD-10-CM | POA: Diagnosis not present

## 2020-10-02 ENCOUNTER — Encounter (HOSPITAL_BASED_OUTPATIENT_CLINIC_OR_DEPARTMENT_OTHER): Payer: Self-pay | Admitting: Emergency Medicine

## 2020-10-02 ENCOUNTER — Other Ambulatory Visit: Payer: Self-pay

## 2020-10-02 ENCOUNTER — Emergency Department (HOSPITAL_BASED_OUTPATIENT_CLINIC_OR_DEPARTMENT_OTHER)
Admission: EM | Admit: 2020-10-02 | Discharge: 2020-10-02 | Disposition: A | Discharge status: Home / Self Care | Payer: Medicare Other | Attending: Emergency Medicine | Admitting: Emergency Medicine

## 2020-10-02 DIAGNOSIS — T7804XA Anaphylactic reaction due to fruits and vegetables, initial encounter: Secondary | ICD-10-CM | POA: Insufficient documentation

## 2020-10-02 DIAGNOSIS — Z96641 Presence of right artificial hip joint: Secondary | ICD-10-CM | POA: Insufficient documentation

## 2020-10-02 DIAGNOSIS — E039 Hypothyroidism, unspecified: Secondary | ICD-10-CM | POA: Insufficient documentation

## 2020-10-02 DIAGNOSIS — Z87891 Personal history of nicotine dependence: Secondary | ICD-10-CM | POA: Insufficient documentation

## 2020-10-02 DIAGNOSIS — J45909 Unspecified asthma, uncomplicated: Secondary | ICD-10-CM | POA: Diagnosis not present

## 2020-10-02 DIAGNOSIS — T7840XA Allergy, unspecified, initial encounter: Secondary | ICD-10-CM

## 2020-10-02 DIAGNOSIS — Z79899 Other long term (current) drug therapy: Secondary | ICD-10-CM | POA: Diagnosis not present

## 2020-10-02 DIAGNOSIS — I10 Essential (primary) hypertension: Secondary | ICD-10-CM | POA: Diagnosis not present

## 2020-10-02 DIAGNOSIS — R238 Other skin changes: Secondary | ICD-10-CM | POA: Insufficient documentation

## 2020-10-02 MED ORDER — HYDROXYZINE HCL 25 MG PO TABS
25.0000 mg | ORAL_TABLET | Freq: Four times a day (QID) | ORAL | 0 refills | Status: DC
Start: 2020-10-02 — End: 2021-07-21

## 2020-10-02 MED ORDER — TRIAMCINOLONE ACETONIDE 0.1 % EX CREA: 1.0000 "application " | TOPICAL_CREAM | Freq: Two times a day (BID) | CUTANEOUS | 0 refills | Status: AC

## 2020-10-02 NOTE — ED Provider Notes (Signed)
[facility] Provider Note   CSN: [id number] Arrival date & time: 10/02/20  1317     History Chief Complaint  Patient presents with   Rash    Allison [name] is a 75 y.o. female.  Patient presents to the emergency department for evaluation of blisters.  Patient was blueberry picking 3 days ago.  The following day she noted small bumps that came up on her legs and a couple on her abdomen.  These were intensely itchy, improved with scratching.  She states that she then developed fluid-filled blisters in the center of these areas.  Some of the blisters have broken others are intact.  No fevers, nausea or vomiting.  No other signs and symptoms of allergic reaction.  She is uncertain if she was bitten by an insect.  No purulent drainage.  She has tried Benadryl and some topical allergy medications without any improvement.  She states that the blisters came up all around the same time.      Past Medical History:  Diagnosis Date   Anemia    Asthmatic bronchitis    Carpal tunnel syndrome    Diverticulosis    DJD (degenerative joint disease) of hip    s/P R hip replacement   Elevated serum alkaline phosphatase level    Fatty liver disease, nonalcoholic    GERD (gastroesophageal reflux disease)    Hyperlipidemia    Hypertension    Internal hemorrhoids    Obesity    Osteoarthritis    Osteopenia    Post-operative nausea and vomiting    Prediabetes    Thyroid disease    hypo   Vitamin D deficiency     Patient Active Problem List   Diagnosis Date Noted   Pain in right shoulder 05/12/2020   Pain in right hip 02/10/2020   Pain in right foot 02/10/2020   Bilateral primary osteoarthritis of knee 12/31/2019   Syncope and collapse 09/08/2019   Chronic pain of right knee 03/28/2016   Unilateral primary osteoarthritis, right knee 03/28/2016   Chondrocalcinosis of right knee 03/28/2016   Acute bronchitis 03/30/2015   HTN (hypertension) 03/04/2013    Osteopenia 03/04/2013   Dyspnea 01/20/2013   Bronchospasm, acute 11/22/2011   Urticaria 11/22/2011   Family history of colon cancer 09/09/2011   Obesity 09/09/2011   Hypokalemia 06/18/2011   Hyperlipidemia    Thyroid disease    Fatty liver disease, nonalcoholic    DJD (degenerative joint disease) of hip     Past Surgical History:  Procedure Laterality Date   CARPAL TUNNEL RELEASE Bilateral    DENTAL SURGERY     JOINT REPLACEMENT  2004   R hip   [place], 2008   2 left and 1 right   TOE FUSION  2000's     OB History   No obstetric history on file.     Family History  Problem Relation Age of Onset   HIV Brother    Colon cancer Mother 104   Heart disease Father    Ovarian cancer Maternal Aunt 58       lung cancer    Social History   Tobacco Use   Smoking status: Former    Packs/day: 2.00    Years: 20.00    Pack years: 40.00    Types: Cigarettes    Quit date: 04/03/1983    Years since quitting: 37.5   Smokeless tobacco: Never  Vaping Use   Vaping Use: Never  used  Substance Use Topics   Alcohol use: Yes    Alcohol/week: 7.0 standard drinks    Types: 7 Standard drinks or equivalent per week    Comment: drinks on weekends per pt   Drug use: No    Home Medications Prior to Admission medications   Medication Sig Start Date End Date Taking? Authorizing Provider  hydrOXYzine (ATARAX/VISTARIL) 25 MG tablet Take 1 tablet (25 mg total) by mouth every 6 (six) hours. 10/02/20  Yes Carlisle Cater, PA-C  triamcinolone cream (KENALOG) 0.1 % Apply 1 application topically 2 (two) times daily. 10/02/20  Yes Carlisle Cater, PA-C  albuterol (PROVENTIL HFA;VENTOLIN HFA) 108 (90 BASE) MCG/ACT inhaler Take 2 inhalations tid as needed 07/27/14   Schoenhoff, Altamese Cabal, MD  atorvastatin (LIPITOR) 20 MG tablet TAKE 1 TABLET EVERY DAY 08/04/15   Hoyt Koch, MD  Calcium Carbonate-Vit D-Min (CALCIUM 1200 PO) Take 1 tablet by mouth daily.    [provider]  Cholecalciferol (VITAMIN D3) 5000 UNITS TABS Take 2,000 Units by mouth daily.    [provider]  diclofenac sodium (VOLTAREN) 1 % GEL Apply 2-4 g topically 4 (four) times daily. 03/11/18   Cherylann Ratel, PA-C  furosemide (LASIX) 40 MG tablet Take 40 mg by mouth daily.    [provider]  levothyroxine (SYNTHROID) 100 MCG tablet Take 100 mcg by mouth daily before breakfast.    [provider]  pimecrolimus (ELIDEL) 1 % cream Apply topically 2 (two) times daily as needed.    [provider]  potassium chloride (MICRO-K) 10 MEQ CR capsule Take 10 mEq by mouth daily.    [provider]    Allergies    Patient has no known allergies.  Review of Systems   Review of Systems  Constitutional:  Negative for fever.  HENT:  Negative for facial swelling and trouble swallowing.   Eyes:  Negative for redness.  Respiratory:  Negative for shortness of breath, wheezing and stridor.   Cardiovascular:  Negative for chest pain.  Gastrointestinal:  Negative for nausea and vomiting.  Musculoskeletal:  Negative for myalgias.  Skin:  Positive for rash.  Neurological:  Negative for light-headedness.  Psychiatric/Behavioral:  Negative for confusion.    Physical Exam Updated Vital Signs BP (!) 170/74 (BP Location: Right Arm)   Pulse 60   Temp 98.6 F (37 C) (Oral)   Resp 18   Ht 5\' 4"  (1.626 m)   Wt 84.4 kg   SpO2 98%   BMI 31.93 kg/m   Physical Exam Vitals and nursing note reviewed.  Constitutional:      Appearance: She is well-developed.  HENT:     Head: Normocephalic and atraumatic.  Eyes:     Conjunctiva/sclera: Conjunctivae normal.  Pulmonary:     Effort: No respiratory distress.  Musculoskeletal:     Cervical back: Normal range of motion and neck supple.  Skin:    General: Skin is warm and dry.     Comments: Patient with approximately 10, scattered, wheals with central bullae noted on the lower extremities and abdomen.   On the left leg there are a couple that have broken open leaving a central shallow-based ulceration.  Neurological:     Mental Status: She is alert.    ED Results / Procedures / Treatments   Labs (all labs ordered are listed, but only abnormal results are displayed) Labs Reviewed - No data to display  EKG None  Radiology No results found.  Procedures Procedures  Medications Ordered in ED Medications - No data to display  ED Course  I have reviewed the triage vital signs and the nursing notes.  Pertinent labs & imaging results that were available during my care of the patient were reviewed by me and considered in my medical decision making (see chart for details).  Patient seen and examined.   Vital signs reviewed and are as follows: BP (!) 170/74 (BP Location: Right Arm)   Pulse 60   Temp 98.6 F (37 C) (Oral)   Resp 18   Ht 5\' 4"  (1.626 m)   Wt 84.4 kg   SpO2 98%   BMI 31.93 kg/m   Patient with allergic appearing wheals and bullae.  They are itchy.  None appear cellulitic.  They all appear to be about the same age.  Suspicious for insect bites as they started within a day of picking blueberries outdoors.  Patient counseled on wound care especially in the areas where the bullae have broken.  Will give topical triamcinolone ointment to use directly on the areas as well as hydroxyzine for itching.  Counseled on sedation with this medication.   Pt urged to return with worsening pain, worsening swelling, expanding area of redness or streaking up extremity, fever, or any other concerns. Pt verbalizes understanding and agrees with plan.    MDM Rules/Calculators/A&P                          Patient with allergic skin reaction.  No signs of anaphylaxis.  No signs of infection.   Final Clinical Impression(s) / ED Diagnoses Final diagnoses:  Bullae  Allergic reaction, initial encounter    Rx / DC Orders ED Discharge Orders          Ordered    triamcinolone cream  (KENALOG) 0.1 %  2 times daily        10/02/20 1414    hydrOXYzine (ATARAX/VISTARIL) 25 MG tablet  Every 6 hours        10/02/20 1414             Carlisle Cater, PA-C 10/02/20 1420    Blanchie Dessert, MD 10/05/20 2215

## 2020-10-02 NOTE — Discharge Instructions (Signed)
Please read and follow all provided instructions.  Your diagnoses today include:  1. Bullae   2. Allergic reaction, initial encounter     Tests performed today include: Vital signs. See below for your results today.   Medications prescribed:  Triamcinolone (Kenalog) cream - topical steroid medication for skin reaction  Hydroxyzine - antihistamine  You can find this medication over-the-counter.   This medication will make you drowsy. DO NOT drive or perform any activities that require you to be awake and alert if taking this.  Take any prescribed medications only as directed.  Home care instructions:  Follow any educational materials contained in this packet.  BE VERY CAREFUL not to take multiple medicines containing Tylenol (also called acetaminophen). Doing so can lead to an overdose which can damage your liver and cause liver failure and possibly death.   Follow-up instructions: Please follow-up with your primary care provider in the next 3 days for further evaluation of your symptoms.   Return instructions:  Please return to the Emergency Department if you experience worsening symptoms.  Please return if you have any other emergent concerns.  Additional Information:  Your vital signs today were: BP (!) 170/74 (BP Location: Right Arm)   Pulse 60   Temp 98.6 F (37 C) (Oral)   Resp 18   Ht 5\' 4"  (1.626 m)   Wt 84.4 kg   SpO2 98%   BMI 31.93 kg/m  If your blood pressure (BP) was elevated above 135/85 this visit, please have this repeated by your doctor within one month. --------------

## 2020-10-02 NOTE — ED Triage Notes (Addendum)
Pt presents with multiple blisters  Bilateral leg and  chest area., clear drainage , x 2 days , sts picked blueberries 3 days ago. Took benadryl with no relief . Itching badly she sts

## 2020-10-06 DIAGNOSIS — R21 Rash and other nonspecific skin eruption: Secondary | ICD-10-CM | POA: Diagnosis not present

## 2020-10-06 DIAGNOSIS — L139 Bullous disorder, unspecified: Secondary | ICD-10-CM | POA: Diagnosis not present

## 2020-10-06 DIAGNOSIS — R238 Other skin changes: Secondary | ICD-10-CM | POA: Diagnosis not present

## 2020-10-11 DIAGNOSIS — Z91038 Other insect allergy status: Secondary | ICD-10-CM | POA: Diagnosis not present

## 2020-10-11 DIAGNOSIS — L98491 Non-pressure chronic ulcer of skin of other sites limited to breakdown of skin: Secondary | ICD-10-CM | POA: Diagnosis not present

## 2020-10-24 DIAGNOSIS — L308 Other specified dermatitis: Secondary | ICD-10-CM | POA: Diagnosis not present

## 2020-10-24 DIAGNOSIS — L309 Dermatitis, unspecified: Secondary | ICD-10-CM | POA: Diagnosis not present

## 2020-11-08 DIAGNOSIS — L308 Other specified dermatitis: Secondary | ICD-10-CM | POA: Diagnosis not present

## 2020-11-29 DIAGNOSIS — L120 Bullous pemphigoid: Secondary | ICD-10-CM | POA: Diagnosis not present

## 2021-01-11 ENCOUNTER — Ambulatory Visit: Payer: Medicare Other | Admitting: Orthopaedic Surgery

## 2021-01-11 ENCOUNTER — Encounter: Payer: Self-pay | Admitting: Orthopaedic Surgery

## 2021-01-11 ENCOUNTER — Ambulatory Visit: Payer: Self-pay

## 2021-01-11 ENCOUNTER — Other Ambulatory Visit: Payer: Self-pay

## 2021-01-11 DIAGNOSIS — G8929 Other chronic pain: Secondary | ICD-10-CM | POA: Diagnosis not present

## 2021-01-11 DIAGNOSIS — M170 Bilateral primary osteoarthritis of knee: Secondary | ICD-10-CM | POA: Diagnosis not present

## 2021-01-11 DIAGNOSIS — M25552 Pain in left hip: Secondary | ICD-10-CM

## 2021-01-11 DIAGNOSIS — M1612 Unilateral primary osteoarthritis, left hip: Secondary | ICD-10-CM | POA: Diagnosis not present

## 2021-01-11 DIAGNOSIS — M545 Low back pain, unspecified: Secondary | ICD-10-CM

## 2021-01-11 MED ORDER — LIDOCAINE HCL 1 % IJ SOLN
2.0000 mL | INTRAMUSCULAR | Status: AC | PRN
  Administered 2021-01-11: 2 mL

## 2021-01-11 MED ORDER — BUPIVACAINE HCL 0.25 % IJ SOLN
2.0000 mL | INTRAMUSCULAR | Status: AC | PRN
  Administered 2021-01-11: 2 mL via INTRA_ARTICULAR

## 2021-01-11 MED ORDER — METHYLPREDNISOLONE ACETATE 40 MG/ML IJ SUSP
80.0000 mg | INTRAMUSCULAR | Status: AC | PRN
  Administered 2021-01-11: 80 mg via INTRA_ARTICULAR

## 2021-01-11 NOTE — Progress Notes (Signed)
Office Visit Note   Patient: Allison [name]           Date of Birth: 05/15/1945           MRN: [id number] Visit Date: 01/11/2021              Requested by: Leeroy Cha, MD [address] [place],  [place] [address] PCP: Leeroy Cha, MD   Assessment & Plan: Visit Diagnoses:  1. Pain in left hip   2. Bilateral primary osteoarthritis of knee   3. Unilateral primary osteoarthritis, left hip   4. Chronic bilateral low back pain without sciatica     Plan: Allison [name] visits the office evaluation of back, left hip and left knee pain.  She has an established diagnosis of advanced osteoarthritis of the left knee.  She has had viscosupplementation and cortisone in the past.  She would like another cortisone injection today but is seriously contemplating knee replacement sometime after the first of the year.  She is reached the point where she is having significant compromise of her activities and not in a prolonged relief with the above treatment.  I would refer her to Dr. Ninfa Linden when she is ready.  She has had some low back pain with x-rays demonstrating significant degenerative disc disease.  There is no referred pain or evidence of radiculopathy.  We discussed exercises.  She might want to come back to the office in the next couple weeks and injected area of local tenderness in the right parasacral region.  She is also experienced some discomfort in her left hip and groin with x-rays demonstrating some early arthritis.  She has had a prior successful right total hip replacement about 20 years ago.  I do not think that is contributing to her present knee pr I also do not think it is at the point where she can will require knee replacementoblem.  Also do not think she is at the point where we need to consider hip replacement  Follow-Up Instructions: Return if symptoms worsen or fail to improve.   Orders:  Orders Placed This Encounter  Procedures   XR Lumbar Spine  2-3 Views   XR Pelvis 1-2 Views   Ambulatory referral to Orthopedic Surgery   No orders of the defined types were placed in this encounter.     Procedures: Large Joint Inj: L knee on 01/11/2021 2:41 PM Indications: pain and diagnostic evaluation Details: 25 G 1.5 in needle, anterolateral approach  Arthrogram: No  Medications: 2 mL lidocaine 1 %; 80 mg methylPREDNISolone acetate 40 MG/ML; 2 mL bupivacaine 0.25 % Procedure, treatment alternatives, risks and benefits explained, specific risks discussed. Consent was given by the patient. Patient was prepped and draped in the usual sterile fashion.      Clinical Data: No additional findings.   Subjective: Chief Complaint  Patient presents with   Left Knee - Pain   Left Hip - Pain  Patient presents today for left recurrent knee pain. She said that she received gel injections in February and states that they did not help. Her pain is located anteriorly. She cannot fully flex her knee. She has difficulty with stairs and it swells. She takes BC powders and Tylenol. More recently with her difficulty walking she has developed left lateral buttock pain as well. She has no groin pain, but does have some lower back pain.   HPI  Review of Systems   Objective: Vital Signs: There were no vitals taken for  this visit.  Physical Exam Constitutional:      Appearance: She is well-developed.  Eyes:     Pupils: Pupils are equal, round, and reactive to light.  Pulmonary:     Effort: Pulmonary effort is normal.  Skin:    General: Skin is warm and dry.  Neurological:     Mental Status: She is alert and oriented to person, place, and time.  Psychiatric:        Behavior: Behavior normal.    Ortho Exam awake but and oriented x3.  Comfortable sitting.  Straight leg raise negative.  Painless range of motion of both hips but slight decrease in external rotation of the left compared to the right hip.  Prior right total hip replacement.  Some  percussible tenderness in the right parasacral region but no masses.  Left knee was not hot red warm or swollen.  No effusion.  Mostly lateral joint pain consistent with the lateral arthritis noted by films.  Neurologically intact.  Full extension and about 102 or 3 degrees of flexion.  No popliteal pain or mass  Specialty Comments:  No specialty comments available.  Imaging: XR Lumbar Spine 2-3 Views  Result Date: 01/11/2021 Films of the lumbar spine were obtained in AP lateral projections.  There is a very minimal right-sided degenerative scoliosis.  There is diffuse degenerative disc disease on the lateral film with slight anterior listhesis of L5 on S1.  Narrowing of the disc space height at every level in the lumbar spine with anterior and posterior spurring.  Minimal calcification of the abdominal aorta.  No obvious aneurysmal dilatation.  Films are consistent with advanced osteoarthritis  XR Pelvis 1-2 Views  Result Date: 01/11/2021 AP pelvis demonstrates an intact right total hip replacement with very minimal poly wear.  Slight superior migration of the femoral head.  Degenerative changes noted in the left hip with narrowing of the joint space and very minimal inferior humeral head spurs and minimal subchondral cysts in the femoral head.    PMFS History: Patient Active Problem List   Diagnosis Date Noted   Unilateral primary osteoarthritis, left hip 01/11/2021   Low back pain 01/11/2021   Pain in right shoulder 05/12/2020   Pain in right hip 02/10/2020   Pain in right foot 02/10/2020   Bilateral primary osteoarthritis of knee 12/31/2019   Syncope and collapse 09/08/2019   Chronic pain of right knee 03/28/2016   Unilateral primary osteoarthritis, right knee 03/28/2016   Chondrocalcinosis of right knee 03/28/2016   Acute bronchitis 03/30/2015   HTN (hypertension) 03/04/2013   Osteopenia 03/04/2013   Dyspnea 01/20/2013   Bronchospasm, acute 11/22/2011   Urticaria 11/22/2011    Family history of colon cancer 09/09/2011   Obesity 09/09/2011   Hypokalemia 06/18/2011   Hyperlipidemia    Thyroid disease    Fatty liver disease, nonalcoholic    DJD (degenerative joint disease) of hip    Past Medical History:  Diagnosis Date   Anemia    Asthmatic bronchitis    Carpal tunnel syndrome    Diverticulosis    DJD (degenerative joint disease) of hip    s/P R hip replacement   Elevated serum alkaline phosphatase level    Fatty liver disease, nonalcoholic    GERD (gastroesophageal reflux disease)    Hyperlipidemia    Hypertension    Internal hemorrhoids    Obesity    Osteoarthritis    Osteopenia    Post-operative nausea and vomiting    Prediabetes  Thyroid disease    hypo   Vitamin D deficiency     Family History  Problem Relation Age of Onset   HIV Brother    Colon cancer Mother 75   Heart disease Father    Ovarian cancer Maternal Aunt 58       lung cancer    Past Surgical History:  Procedure Laterality Date   CARPAL TUNNEL RELEASE Bilateral    DENTAL SURGERY     JOINT REPLACEMENT  2004   R hip   [place], 2008   2 left and 1 right   TOE FUSION  2000's   Social History   Occupational History   Occupation: Retired  Tobacco Use   Smoking status: Former    Packs/day: 2.00    Years: 20.00    Pack years: 40.00    Types: Cigarettes    Quit date: 04/03/1983    Years since quitting: 37.8   Smokeless tobacco: Never  Vaping Use   Vaping Use: Never used  Substance and Sexual Activity   Alcohol use: Yes    Alcohol/week: 7.0 standard drinks    Types: 7 Standard drinks or equivalent per week    Comment: drinks on weekends per pt   Drug use: No   Sexual activity: Never    Birth control/protection: Post-menopausal

## 2021-01-25 ENCOUNTER — Other Ambulatory Visit: Payer: Self-pay

## 2021-01-25 ENCOUNTER — Ambulatory Visit: Payer: Medicare Other | Admitting: Orthopaedic Surgery

## 2021-01-25 ENCOUNTER — Encounter: Payer: Self-pay | Admitting: Orthopaedic Surgery

## 2021-01-25 DIAGNOSIS — G8929 Other chronic pain: Secondary | ICD-10-CM | POA: Diagnosis not present

## 2021-01-25 DIAGNOSIS — M545 Low back pain, unspecified: Secondary | ICD-10-CM

## 2021-01-25 MED ORDER — LIDOCAINE HCL 1 % IJ SOLN
4.0000 mL | INTRAMUSCULAR | Status: AC | PRN
  Administered 2021-01-25: 4 mL

## 2021-01-25 MED ORDER — METHYLPREDNISOLONE ACETATE 40 MG/ML IJ SUSP
80.0000 mg | INTRAMUSCULAR | Status: AC | PRN
Start: 2021-01-25 — End: 2021-01-25
  Administered 2021-01-25: 80 mg via INTRAMUSCULAR

## 2021-01-25 NOTE — Progress Notes (Signed)
Office Visit Note   Patient: Allison [name]           Date of Birth: 12-Oct-1945           MRN: [id number] Visit Date: 01/25/2021              Requested by: Leeroy Cha, MD [address] [place],  [place] [address] PCP: Leeroy Cha, MD   Assessment & Plan: Visit Diagnoses: No diagnosis found.  Plan: Patient with a history of end-stage left knee arthritis as well as lower back pain.  She has an appointment with Dr. Ninfa Linden to discuss knee replacement next year.  This is in November.  She also wishes to have her lower area of tenderness in her right parasacral area injected and it was today.  She may follow-up as needed  Follow-Up Instructions: No follow-ups on file.   Orders:  No orders of the defined types were placed in this encounter.  No orders of the defined types were placed in this encounter.     Procedures: Trigger Point Inj  Date/Time: 01/25/2021 1:09 PM Performed by: Persons, Bevely Palmer, PA Authorized by: Persons, Bevely Palmer, [place]   Consent Given by:  Patient Site marked: the procedure site was marked   Total # of Trigger Points:  1 Location: back   Needle Size:  25 G Medications #1:  4 mL lidocaine 1 %; 80 mg methylPREDNISolone acetate 40 MG/ML Comments: After verbal consent was obtained the area of maximal tenderness on the right parasacral area was marked.  This was cleaned with Betadine and alcohol.  Injection of lidocaine and methylprednisolone was administered.  She tolerated this well Band-Aid was applied    Clinical Data: No additional findings.   Subjective: No chief complaint on file. Patient presents today for a two week follow up on her lower back and hip pain.     Review of Systems  All other systems reviewed and are negative.   Objective: Vital Signs: There were no vitals taken for this visit.  Physical Exam Normal affect patient is a comfortable Ortho Exam Examination of her lower back no  deformity no redness no erythema she is focally tender in area just to the right parasacral.  She denies any paresthesias strength is good without any deficits no radicular findings Specialty Comments:  No specialty comments available.  Imaging: No results found.   PMFS History: Patient Active Problem List   Diagnosis Date Noted   Unilateral primary osteoarthritis, left hip 01/11/2021   Low back pain 01/11/2021   Pain in right shoulder 05/12/2020   Pain in right hip 02/10/2020   Pain in right foot 02/10/2020   Bilateral primary osteoarthritis of knee 12/31/2019   Syncope and collapse 09/08/2019   Chronic pain of right knee 03/28/2016   Unilateral primary osteoarthritis, right knee 03/28/2016   Chondrocalcinosis of right knee 03/28/2016   Acute bronchitis 03/30/2015   HTN (hypertension) 03/04/2013   Osteopenia 03/04/2013   Dyspnea 01/20/2013   Bronchospasm, acute 11/22/2011   Urticaria 11/22/2011   Family history of colon cancer 09/09/2011   Obesity 09/09/2011   Hypokalemia 06/18/2011   Hyperlipidemia    Thyroid disease    Fatty liver disease, nonalcoholic    DJD (degenerative joint disease) of hip    Past Medical History:  Diagnosis Date   Anemia    Asthmatic bronchitis    Carpal tunnel syndrome    Diverticulosis    DJD (degenerative joint disease) of hip  s/P R hip replacement   Elevated serum alkaline phosphatase level    Fatty liver disease, nonalcoholic    GERD (gastroesophageal reflux disease)    Hyperlipidemia    Hypertension    Internal hemorrhoids    Obesity    Osteoarthritis    Osteopenia    Post-operative nausea and vomiting    Prediabetes    Thyroid disease    hypo   Vitamin D deficiency     Family History  Problem Relation Age of Onset   HIV Brother    Colon cancer Mother 6   Heart disease Father    Ovarian cancer Maternal Aunt 58       lung cancer    Past Surgical History:  Procedure Laterality Date   CARPAL TUNNEL RELEASE Bilateral     DENTAL SURGERY     JOINT REPLACEMENT  2004   R hip   [place], 2008   2 left and 1 right   TOE FUSION  2000's   Social History   Occupational History   Occupation: Retired  Tobacco Use   Smoking status: Former    Packs/day: 2.00    Years: 20.00    Pack years: 40.00    Types: Cigarettes    Quit date: 04/03/1983    Years since quitting: 37.8   Smokeless tobacco: Never  Vaping Use   Vaping Use: Never used  Substance and Sexual Activity   Alcohol use: Yes    Alcohol/week: 7.0 standard drinks    Types: 7 Standard drinks or equivalent per week    Comment: drinks on weekends per pt   Drug use: No   Sexual activity: Never    Birth control/protection: Post-menopausal

## 2021-01-31 DIAGNOSIS — H04123 Dry eye syndrome of bilateral lacrimal glands: Secondary | ICD-10-CM | POA: Diagnosis not present

## 2021-02-07 ENCOUNTER — Other Ambulatory Visit: Payer: Self-pay | Admitting: Internal Medicine

## 2021-02-07 DIAGNOSIS — I10 Essential (primary) hypertension: Secondary | ICD-10-CM | POA: Diagnosis not present

## 2021-02-07 DIAGNOSIS — E7800 Pure hypercholesterolemia, unspecified: Secondary | ICD-10-CM | POA: Diagnosis not present

## 2021-02-07 DIAGNOSIS — Z0000 Encounter for general adult medical examination without abnormal findings: Secondary | ICD-10-CM | POA: Diagnosis not present

## 2021-02-07 DIAGNOSIS — R7303 Prediabetes: Secondary | ICD-10-CM | POA: Diagnosis not present

## 2021-02-07 DIAGNOSIS — J45909 Unspecified asthma, uncomplicated: Secondary | ICD-10-CM | POA: Diagnosis not present

## 2021-02-07 DIAGNOSIS — G5600 Carpal tunnel syndrome, unspecified upper limb: Secondary | ICD-10-CM | POA: Diagnosis not present

## 2021-02-07 DIAGNOSIS — M85859 Other specified disorders of bone density and structure, unspecified thigh: Secondary | ICD-10-CM | POA: Diagnosis not present

## 2021-02-07 DIAGNOSIS — L120 Bullous pemphigoid: Secondary | ICD-10-CM | POA: Diagnosis not present

## 2021-02-07 DIAGNOSIS — R7309 Other abnormal glucose: Secondary | ICD-10-CM | POA: Diagnosis not present

## 2021-02-07 DIAGNOSIS — E039 Hypothyroidism, unspecified: Secondary | ICD-10-CM | POA: Diagnosis not present

## 2021-02-08 ENCOUNTER — Ambulatory Visit: Payer: Self-pay

## 2021-02-08 ENCOUNTER — Ambulatory Visit: Payer: Medicare Other | Admitting: Orthopaedic Surgery

## 2021-02-08 VITALS — Ht 64.0 in | Wt 189.0 lb

## 2021-02-08 DIAGNOSIS — M25562 Pain in left knee: Secondary | ICD-10-CM

## 2021-02-08 DIAGNOSIS — M25561 Pain in right knee: Secondary | ICD-10-CM

## 2021-02-08 DIAGNOSIS — M1712 Unilateral primary osteoarthritis, left knee: Secondary | ICD-10-CM | POA: Diagnosis not present

## 2021-02-08 DIAGNOSIS — G8929 Other chronic pain: Secondary | ICD-10-CM

## 2021-02-08 DIAGNOSIS — M1711 Unilateral primary osteoarthritis, right knee: Secondary | ICD-10-CM

## 2021-02-08 NOTE — Progress Notes (Signed)
Office Visit Note   Patient: Allison [name]           Date of Birth: 05-25-1945           MRN: [id number] Visit Date: 02/08/2021              Requested by: Garald Balding, MD [address] [place],  [place] [address] PCP: Leeroy Cha, MD   Assessment & Plan: Visit Diagnoses:  1. Chronic pain of left knee   2. Chronic pain of right knee   3. Unilateral primary osteoarthritis, left knee   4. Unilateral primary osteoarthritis, right knee     Plan: I talked her in length in detail about knee replacement surgery.  She does wish to have this for her left knee after the first year and I agree with this.  We talked about the interoperative and postoperative course and the risk and benefits of surgery.  I went over her x-rays with her and showed her knee replacement model and described how the surgery is performed.  All questions and concerns were answered and addressed.  She understands we will be in touch with her about scheduling the surgery.  Follow-Up Instructions: Return for 2 weeks post-op.   Orders:  Orders Placed This Encounter  Procedures   XR Knee 1-2 Views Right   XR Knee 1-2 Views Left   No orders of the defined types were placed in this encounter.     Procedures: No procedures performed   Clinical Data: No additional findings.   Subjective: Chief Complaint  Patient presents with   Left Knee - Pain   Right Knee - Pain  The patient is a long-term patient of Dr. Durward Fortes who has well-documented arthritis in both of her knees with the left worse than the right.  She has had multiple injections for years now including steroids and hyaluronic acid injections.  She is a very active 75 year old female and is a Psychologist, occupational at [employer].  At this point her left knee pain is becoming daily and it is detriment affecting her mobility, her quality of life and actives daily living.  She is worked on activity modification and taking anti-inflammatories and  again had multiple interventions with steroids and hyaluronic acid injections.  At this point she would like to consider knee replacement surgery for her left knee sometime after the first of the year.  HPI  Review of Systems She currently denies any headache, chest pain, shortness of breath, fever, chills, nausea, vomiting  Objective: Vital Signs: Ht 5\' 4"  (1.626 m)   Wt 189 lb (85.7 kg)   BMI 32.44 kg/m   Physical Exam She is alert and orient x3 and in no acute distress Ortho Exam Examination of her left knee shows slight valgus malalignment.  There is lateral joint line tenderness and patella from crepitation.  The knee is slightly ligamentously lax with good range of motion.  Her right knee has slight varus malalignment with global tenderness as well and patellofemoral crepitation. Specialty Comments:  No specialty comments available.  Imaging: XR Knee 1-2 Views Left  Result Date: 02/08/2021 2 views of the left knee show tricompartment arthritic changes with almost complete loss of the lateral joint space and patellofemoral joint.  There are osteophytes in all 3 compartments.  XR Knee 1-2 Views Right  Result Date: 02/08/2021 2 views of the right knee show tricompartment arthritic changes with joint space narrowing and peritubular osteophytes.    PMFS History: Patient Active Problem  List   Diagnosis Date Noted   Unilateral primary osteoarthritis, left knee 02/08/2021   Unilateral primary osteoarthritis, left hip 01/11/2021   Low back pain 01/11/2021   Pain in right shoulder 05/12/2020   Pain in right hip 02/10/2020   Pain in right foot 02/10/2020   Bilateral primary osteoarthritis of knee 12/31/2019   Syncope and collapse 09/08/2019   Chronic pain of right knee 03/28/2016   Unilateral primary osteoarthritis, right knee 03/28/2016   Chondrocalcinosis of right knee 03/28/2016   Acute bronchitis 03/30/2015   HTN (hypertension) 03/04/2013   Osteopenia 03/04/2013    Dyspnea 01/20/2013   Bronchospasm, acute 11/22/2011   Urticaria 11/22/2011   Family history of colon cancer 09/09/2011   Obesity 09/09/2011   Hypokalemia 06/18/2011   Hyperlipidemia    Thyroid disease    Fatty liver disease, nonalcoholic    DJD (degenerative joint disease) of hip    Past Medical History:  Diagnosis Date   Anemia    Asthmatic bronchitis    Carpal tunnel syndrome    Diverticulosis    DJD (degenerative joint disease) of hip    s/P R hip replacement   Elevated serum alkaline phosphatase level    Fatty liver disease, nonalcoholic    GERD (gastroesophageal reflux disease)    Hyperlipidemia    Hypertension    Internal hemorrhoids    Obesity    Osteoarthritis    Osteopenia    Post-operative nausea and vomiting    Prediabetes    Thyroid disease    hypo   Vitamin D deficiency     Family History  Problem Relation Age of Onset   HIV Brother    Colon cancer Mother 70   Heart disease Father    Ovarian cancer Maternal Aunt 58       lung cancer    Past Surgical History:  Procedure Laterality Date   CARPAL TUNNEL RELEASE Bilateral    DENTAL SURGERY     JOINT REPLACEMENT  2004   R hip   [place], 2008   2 left and 1 right   TOE FUSION  2000's   Social History   Occupational History   Occupation: Retired  Tobacco Use   Smoking status: Former    Packs/day: 2.00    Years: 20.00    Pack years: 40.00    Types: Cigarettes    Quit date: 04/03/1983    Years since quitting: 37.8   Smokeless tobacco: Never  Vaping Use   Vaping Use: Never used  Substance and Sexual Activity   Alcohol use: Yes    Alcohol/week: 7.0 standard drinks    Types: 7 Standard drinks or equivalent per week    Comment: drinks on weekends per pt   Drug use: No   Sexual activity: Never    Birth control/protection: Post-menopausal

## 2021-02-24 ENCOUNTER — Encounter: Payer: Self-pay | Admitting: Internal Medicine

## 2021-03-06 DIAGNOSIS — L718 Other rosacea: Secondary | ICD-10-CM | POA: Diagnosis not present

## 2021-03-06 DIAGNOSIS — L120 Bullous pemphigoid: Secondary | ICD-10-CM | POA: Diagnosis not present

## 2021-03-08 ENCOUNTER — Encounter: Payer: Self-pay | Admitting: Orthopaedic Surgery

## 2021-03-08 ENCOUNTER — Ambulatory Visit: Payer: Medicare Other | Admitting: Orthopaedic Surgery

## 2021-03-08 ENCOUNTER — Other Ambulatory Visit: Payer: Self-pay

## 2021-03-08 DIAGNOSIS — G8929 Other chronic pain: Secondary | ICD-10-CM | POA: Diagnosis not present

## 2021-03-08 DIAGNOSIS — M5441 Lumbago with sciatica, right side: Secondary | ICD-10-CM

## 2021-03-08 DIAGNOSIS — M25551 Pain in right hip: Secondary | ICD-10-CM | POA: Diagnosis not present

## 2021-03-08 NOTE — Progress Notes (Signed)
Office Visit Note   Patient: Allison [name]           Date of Birth: 17-Dec-1945           MRN: [id number] Visit Date: 03/08/2021              Requested by: Leeroy Cha, MD [address] [place],  [place] [address] PCP: Leeroy Cha, MD   Assessment & Plan: Visit Diagnoses:  1. Chronic bilateral low back pain with right-sided sciatica   2. Pain in right hip     Plan: [name] has seen Dr. Ninfa Linden for evaluation of her arthritic right knee and plans to have a knee replacement sometime after the first of the year.  I have also seen her for evaluation of right hip pain with evidence of some mild polywear that occasionally is symptomatic.  Index surgery was in 2004.  Recently she has had some trouble with her back and I injected an area of trigger point tenderness and she is not sure it made much of a difference but also has some referred pain along her right greater trochanter into her groin and groin even along the medial aspect of her thigh.  She has pain at rest or even with motion.  I think the problem may be more referable to her back than to her hip but she had no pain with motion of her hip.  I am going to order an MRI scan of her lumbar spine.  She has had prior films in October of her lumbar spine demonstrating significant degenerative changes and I would not be surprised if she has some stenosis  Follow-Up Instructions: Return After MRI scan lumbar spine.   Orders:  No orders of the defined types were placed in this encounter.  No orders of the defined types were placed in this encounter.     Procedures: No procedures performed   Clinical Data: No additional findings.   Subjective: Chief Complaint  Patient presents with   Right Hip - Pain  Patient presents today for right hip pain. She said that she continue to hurt in the same area as her last visit, but now the pain travels into her leg and groin area.  Clinically he has experienced  some back pain and paralumbar discomfort.  She will have discomfort when she is up and ambulating or even when she is just at rest even sleeping.  There is no pain beyond the mid thigh but the pain will start in her back radiating into her buttock and across her trochanter into her right groin and the medial aspect of her thigh  HPI  Review of Systems   Objective: Vital Signs: There were no vitals taken for this visit.  Physical Exam Constitutional:      Appearance: She is well-developed.  Eyes:     Pupils: Pupils are equal, round, and reactive to light.  Pulmonary:     Effort: Pulmonary effort is normal.  Skin:    General: Skin is warm and dry.  Neurological:     Mental Status: She is alert and oriented to person, place, and time.  Psychiatric:        Behavior: Behavior normal.    Ortho Exam painless range of motion of both of her hips and, specifically, the right hip.  There was no loss of motion on the extreme of internal or external rotation.  No localized areas of tenderness about the groin of the greater trochanter.  There were some areas of tenderness in the paralumbar region and at the lumbosacral junction but no masses.  No pain over the SI joint  Specialty Comments:  No specialty comments available.  Imaging: No results found.   PMFS History: Patient Active Problem List   Diagnosis Date Noted   Unilateral primary osteoarthritis, left knee 02/08/2021   Unilateral primary osteoarthritis, left hip 01/11/2021   Low back pain 01/11/2021   Pain in right shoulder 05/12/2020   Pain in right hip 02/10/2020   Pain in right foot 02/10/2020   Bilateral primary osteoarthritis of knee 12/31/2019   Syncope and collapse 09/08/2019   Chronic pain of right knee 03/28/2016   Unilateral primary osteoarthritis, right knee 03/28/2016   Chondrocalcinosis of right knee 03/28/2016   Acute bronchitis 03/30/2015   HTN (hypertension) 03/04/2013   Osteopenia 03/04/2013   Dyspnea  01/20/2013   Bronchospasm, acute 11/22/2011   Urticaria 11/22/2011   Family history of colon cancer 09/09/2011   Obesity 09/09/2011   Hypokalemia 06/18/2011   Hyperlipidemia    Thyroid disease    Fatty liver disease, nonalcoholic    DJD (degenerative joint disease) of hip    Past Medical History:  Diagnosis Date   Anemia    Asthmatic bronchitis    Carpal tunnel syndrome    Diverticulosis    DJD (degenerative joint disease) of hip    s/P R hip replacement   Elevated serum alkaline phosphatase level    Fatty liver disease, nonalcoholic    GERD (gastroesophageal reflux disease)    Hyperlipidemia    Hypertension    Internal hemorrhoids    Obesity    Osteoarthritis    Osteopenia    Post-operative nausea and vomiting    Prediabetes    Thyroid disease    hypo   Vitamin D deficiency     Family History  Problem Relation Age of Onset   HIV Brother    Colon cancer Mother 52   Heart disease Father    Ovarian cancer Maternal Aunt 58       lung cancer    Past Surgical History:  Procedure Laterality Date   CARPAL TUNNEL RELEASE Bilateral    DENTAL SURGERY     JOINT REPLACEMENT  2004   R hip   [place], 2008   2 left and 1 right   TOE FUSION  2000's   Social History   Occupational History   Occupation: Retired  Tobacco Use   Smoking status: Former    Packs/day: 2.00    Years: 20.00    Pack years: 40.00    Types: Cigarettes    Quit date: 04/03/1983    Years since quitting: 37.9   Smokeless tobacco: Never  Vaping Use   Vaping Use: Never used  Substance and Sexual Activity   Alcohol use: Yes    Alcohol/week: 7.0 standard drinks    Types: 7 Standard drinks or equivalent per week    Comment: drinks on weekends per pt   Drug use: No   Sexual activity: Never    Birth control/protection: Post-menopausal

## 2021-03-28 ENCOUNTER — Other Ambulatory Visit: Payer: Self-pay

## 2021-03-28 DIAGNOSIS — G8929 Other chronic pain: Secondary | ICD-10-CM

## 2021-04-17 ENCOUNTER — Telehealth: Payer: Self-pay | Admitting: Orthopaedic Surgery

## 2021-04-17 NOTE — Telephone Encounter (Signed)
LMOM for pt to return call to get sch for post MRI after 04/26/21 with PW

## 2021-04-26 ENCOUNTER — Ambulatory Visit
Admission: RE | Admit: 2021-04-26 | Discharge: 2021-04-26 | Disposition: A | Discharge status: Home / Self Care | Payer: Medicare Other | Source: Ambulatory Visit | Attending: Orthopaedic Surgery | Admitting: Orthopaedic Surgery

## 2021-04-26 DIAGNOSIS — M2578 Osteophyte, vertebrae: Secondary | ICD-10-CM | POA: Diagnosis not present

## 2021-04-26 DIAGNOSIS — M5416 Radiculopathy, lumbar region: Secondary | ICD-10-CM | POA: Diagnosis not present

## 2021-04-26 DIAGNOSIS — M545 Low back pain, unspecified: Secondary | ICD-10-CM | POA: Diagnosis not present

## 2021-04-26 DIAGNOSIS — G8929 Other chronic pain: Secondary | ICD-10-CM

## 2021-04-26 DIAGNOSIS — M48061 Spinal stenosis, lumbar region without neurogenic claudication: Secondary | ICD-10-CM | POA: Diagnosis not present

## 2021-04-26 DIAGNOSIS — M5441 Lumbago with sciatica, right side: Secondary | ICD-10-CM

## 2021-04-27 ENCOUNTER — Other Ambulatory Visit: Payer: Self-pay

## 2021-05-09 ENCOUNTER — Ambulatory Visit: Payer: Medicare Other | Admitting: Orthopaedic Surgery

## 2021-05-09 ENCOUNTER — Other Ambulatory Visit: Payer: Self-pay

## 2021-05-09 ENCOUNTER — Encounter: Payer: Self-pay | Admitting: Orthopaedic Surgery

## 2021-05-09 VITALS — Ht 64.0 in | Wt 189.0 lb

## 2021-05-09 DIAGNOSIS — M1712 Unilateral primary osteoarthritis, left knee: Secondary | ICD-10-CM | POA: Diagnosis not present

## 2021-05-09 NOTE — Progress Notes (Addendum)
Office Visit Note   Patient: Allison [name]           Date of Birth: December 27, 1945           MRN: [id number] Visit Date: 05/09/2021              Requested by: Leeroy Cha, MD [address] [place],  [place] [address] PCP: Leeroy Cha, MD   Assessment & Plan: Visit Diagnoses: Low back pain  Plan: Pleasant 76 year old woman with a history of lower back pain and right radicular symptoms.  At her last visit an MRI was ordered of her lumbar spine.  Her symptoms have remained unchanged.  She does have more pain going down her legs when she has been standing for a period of time.  She is here to review her MRI MRI showed degenerative changes throughout her entire lumbar spine.  Also some stenosis.  Specifically at L4-5 severe multifactorial spinal stenosis with endplate osteophytes right foraminal stenosis as well facet arthropathy at well at L3-4 with some minimal anterior listhesis she also has stenosis and facet arthropathy at the L5-S1 level we think she might do well with an epidural steroid injection she is seeing Dr. Ernestina Patches in the past we will refer her to him.  Also given her some exercises to do  Follow-Up Instructions: No follow-ups on file.   Orders:  No orders of the defined types were placed in this encounter.  No orders of the defined types were placed in this encounter.     Procedures: No procedures performed   Clinical Data: No additional findings.   Subjective: No chief complaint on file. Patient presents today for follow up on her lower back. She had an MRI and is here today for those results.    Review of Systems   Objective: Vital Signs: There were no vitals taken for this visit.  Physical Exam Well-developed well-nourished pleasant woman sitting comfortably Ortho Exam Exam is unchanged she has good motor strength 5 out of 5 she does not have any pain to palpation over her spine sensation is intact distally she has good  strength with resisted extension flexion Specialty Comments:  No specialty comments available.  Imaging: No results found.   PMFS History: Patient Active Problem List   Diagnosis Date Noted   Unilateral primary osteoarthritis, left knee 02/08/2021   Unilateral primary osteoarthritis, left hip 01/11/2021   Low back pain 01/11/2021   Pain in right shoulder 05/12/2020   Pain in right hip 02/10/2020   Pain in right foot 02/10/2020   Bilateral primary osteoarthritis of knee 12/31/2019   Syncope and collapse 09/08/2019   Chronic pain of right knee 03/28/2016   Unilateral primary osteoarthritis, right knee 03/28/2016   Chondrocalcinosis of right knee 03/28/2016   Acute bronchitis 03/30/2015   HTN (hypertension) 03/04/2013   Osteopenia 03/04/2013   Dyspnea 01/20/2013   Bronchospasm, acute 11/22/2011   Urticaria 11/22/2011   Family history of colon cancer 09/09/2011   Obesity 09/09/2011   Hypokalemia 06/18/2011   Hyperlipidemia    Thyroid disease    Fatty liver disease, nonalcoholic    DJD (degenerative joint disease) of hip    Past Medical History:  Diagnosis Date   Anemia    Asthmatic bronchitis    Carpal tunnel syndrome    Diverticulosis    DJD (degenerative joint disease) of hip    s/P R hip replacement   Elevated serum alkaline phosphatase level    Fatty liver disease,  nonalcoholic    GERD (gastroesophageal reflux disease)    Hyperlipidemia    Hypertension    Internal hemorrhoids    Obesity    Osteoarthritis    Osteopenia    Post-operative nausea and vomiting    Prediabetes    Thyroid disease    hypo   Vitamin D deficiency     Family History  Problem Relation Age of Onset   HIV Brother    Colon cancer Mother 65   Heart disease Father    Ovarian cancer Maternal Aunt 58       lung cancer    Past Surgical History:  Procedure Laterality Date   CARPAL TUNNEL RELEASE Bilateral    DENTAL SURGERY     JOINT REPLACEMENT  2004   R hip   [place], 2008   2 left and 1 right   TOE FUSION  2000's   Social History   Occupational History   Occupation: Retired  Tobacco Use   Smoking status: Former    Packs/day: 2.00    Years: 20.00    Pack years: 40.00    Types: Cigarettes    Quit date: 04/03/1983    Years since quitting: 38.1   Smokeless tobacco: Never  Vaping Use   Vaping Use: Never used  Substance and Sexual Activity   Alcohol use: Yes    Alcohol/week: 7.0 standard drinks    Types: 7 Standard drinks or equivalent per week    Comment: drinks on weekends per pt   Drug use: No   Sexual activity: Never    Birth control/protection: Post-menopausal

## 2021-05-09 NOTE — Addendum Note (Signed)
Addended by: Lendon Collar on: 05/09/2021 03:22 PM   Modules accepted: Orders

## 2021-05-25 ENCOUNTER — Other Ambulatory Visit: Payer: Self-pay

## 2021-05-25 ENCOUNTER — Ambulatory Visit: Payer: Medicare Other | Admitting: Physical Medicine and Rehabilitation

## 2021-05-25 ENCOUNTER — Ambulatory Visit: Payer: Self-pay

## 2021-05-25 ENCOUNTER — Encounter: Payer: Self-pay | Admitting: Physical Medicine and Rehabilitation

## 2021-05-25 VITALS — BP 162/85 | HR 76

## 2021-05-25 DIAGNOSIS — M5416 Radiculopathy, lumbar region: Secondary | ICD-10-CM

## 2021-05-25 MED ORDER — METHYLPREDNISOLONE ACETATE 80 MG/ML IJ SUSP
80.0000 mg | Freq: Once | INTRAMUSCULAR | Status: AC
  Administered 2021-05-25: 80 mg

## 2021-05-25 NOTE — Patient Instructions (Signed)

## 2021-05-25 NOTE — Progress Notes (Signed)
Pt state lower back pain that travels down her left leg. Pt state walking, standing and bending makes the pain worse. Pt state she takes over the counter pain and uses pain cream to help ease her pain worse.  Numeric Pain Rating Scale and Functional Assessment Average Pain 2   In the last MONTH (on 0-10 scale) has pain interfered with the following?  1. General activity like being  able to carry out your everyday physical activities such as walking, climbing stairs, carrying groceries, or moving a chair?  Rating(10)   +Driver, -BT, -Dye Allergies.

## 2021-06-11 NOTE — Progress Notes (Signed)
Allison [name] - 76 y.o. female MRN [id number]  Date of birth: [birth year]  Office Visit Note: Visit Date: 05/25/2021 PCP: Leeroy Cha, MD Referred by: Leeroy Cha,*  Subjective: Chief Complaint  Patient presents with   Lower Back - Pain   Left Leg - Pain   HPI:  Allison [name] is a 76 y.o. female who comes in today at the request of Dr. Joni Fears for planned Right L4-5 Lumbar Transforaminal epidural steroid injection with fluoroscopic guidance.  The patient has failed conservative care including home exercise, medications, time and activity modification.  This injection will be diagnostic and hopefully therapeutic.  Please see requesting physician notes for further details and justification.  ROS Otherwise per HPI.  Assessment & Plan: Visit Diagnoses:    ICD-10-CM   1. Lumbar radiculopathy  M54.16 XR C-ARM NO REPORT    Epidural Steroid injection    methylPREDNISolone acetate (DEPO-MEDROL) injection 80 mg      Plan: No additional findings.   Meds & Orders:  Meds ordered this encounter  Medications   methylPREDNISolone acetate (DEPO-MEDROL) injection 80 mg    Orders Placed This Encounter  Procedures   XR C-ARM NO REPORT   Epidural Steroid injection    Follow-up: No follow-ups on file.   Procedures: No procedures performed  Lumbosacral Transforaminal Epidural Steroid Injection - Sub-Pedicular Approach with Fluoroscopic Guidance  Patient: Allison [name]      Date of Birth: 25-Aug-1945 MRN: [id number] PCP: Leeroy Cha, MD      Visit Date: 05/25/2021   Universal Protocol:    Date/Time: 05/25/2021  Consent Given By: the patient  Position: PRONE  Additional Comments: Vital signs were monitored before and after the procedure. Patient was prepped and draped in the usual sterile fashion. The correct patient, procedure, and site was verified.   Injection Procedure Details:   Procedure diagnoses: Lumbar radiculopathy  [M54.16]    Meds Administered:  Meds ordered this encounter  Medications   methylPREDNISolone acetate (DEPO-MEDROL) injection 80 mg    Laterality: Right  Location/Site: L4  Needle:5.0 in., 22 ga.  Short bevel or Quincke spinal needle  Needle Placement: Transforaminal  Findings:    -Comments: Excellent flow of contrast along the nerve, nerve root and into the epidural space.  Procedure Details: After squaring off the end-plates to get a true AP view, the C-arm was positioned so that an oblique view of the foramen as noted above was visualized. The target area is just inferior to the "nose of the scotty dog" or sub pedicular. The soft tissues overlying this structure were infiltrated with 2-3 ml. of 1% Lidocaine without Epinephrine.  The spinal needle was inserted toward the target using a "trajectory" view along the fluoroscope beam.  Under AP and lateral visualization, the needle was advanced so it did not puncture dura and was located close the 6 O'Clock position of the pedical in AP tracterory. Biplanar projections were used to confirm position. Aspiration was confirmed to be negative for CSF and/or blood. A 1-2 ml. volume of Isovue-250 was injected and flow of contrast was noted at each level. Radiographs were obtained for documentation purposes.   After attaining the desired flow of contrast documented above, a 0.5 to 1.0 ml test dose of 0.25% Marcaine was injected into each respective transforaminal space.  The patient was observed for 90 seconds post injection.  After no sensory deficits were reported, and normal lower extremity motor function was noted,   the above injectate was administered so  that equal amounts of the injectate were placed at each foramen (level) into the transforaminal epidural space.   Additional Comments:  The patient tolerated the procedure well Dressing: 2 x 2 sterile gauze and Band-Aid    Post-procedure details: Patient was observed during the  procedure. Post-procedure instructions were reviewed.  Patient left the clinic in stable condition.    Clinical History: No specialty comments available.     Objective:  VS:  HT:     WT:    BMI:      BP:(!) 162/85   HR:76bpm   TEMP: ( )   RESP:  Physical Exam Vitals and nursing note reviewed.  Constitutional:      General: She is not in acute distress.    Appearance: Normal appearance. She is not ill-appearing.  HENT:     Head: Normocephalic and atraumatic.     Right Ear: External ear normal.     Left Ear: External ear normal.  Eyes:     Extraocular Movements: Extraocular movements intact.  Cardiovascular:     Rate and Rhythm: Normal rate.     Pulses: Normal pulses.  Pulmonary:     Effort: Pulmonary effort is normal. No respiratory distress.  Abdominal:     General: There is no distension.     Palpations: Abdomen is soft.  Musculoskeletal:        General: Tenderness present.     Cervical back: Neck supple.     Right lower leg: No edema.     Left lower leg: No edema.     Comments: Patient has good distal strength with no pain over the greater trochanters.  No clonus or focal weakness.  Skin:    Findings: No erythema, lesion or rash.  Neurological:     General: No focal deficit present.     Mental Status: She is alert and oriented to person, place, and time.     Sensory: No sensory deficit.     Motor: No weakness or abnormal muscle tone.     Coordination: Coordination normal.  Psychiatric:        Mood and Affect: Mood normal.        Behavior: Behavior normal.     Imaging: No results found.

## 2021-06-11 NOTE — Procedures (Signed)
Lumbosacral Transforaminal Epidural Steroid Injection - Sub-Pedicular Approach with Fluoroscopic Guidance  Patient: Allison [name]      Date of Birth: 25-Jun-1945 MRN: [id number] PCP: Leeroy Cha, MD      Visit Date: 05/25/2021   Universal Protocol:    Date/Time: 05/25/2021  Consent Given By: the patient  Position: PRONE  Additional Comments: Vital signs were monitored before and after the procedure. Patient was prepped and draped in the usual sterile fashion. The correct patient, procedure, and site was verified.   Injection Procedure Details:   Procedure diagnoses: Lumbar radiculopathy [M54.16]    Meds Administered:  Meds ordered this encounter  Medications   methylPREDNISolone acetate (DEPO-MEDROL) injection 80 mg    Laterality: Right  Location/Site: L4  Needle:5.0 in., 22 ga.  Short bevel or Quincke spinal needle  Needle Placement: Transforaminal  Findings:    -Comments: Excellent flow of contrast along the nerve, nerve root and into the epidural space.  Procedure Details: After squaring off the end-plates to get a true AP view, the C-arm was positioned so that an oblique view of the foramen as noted above was visualized. The target area is just inferior to the "nose of the scotty dog" or sub pedicular. The soft tissues overlying this structure were infiltrated with 2-3 ml. of 1% Lidocaine without Epinephrine.  The spinal needle was inserted toward the target using a "trajectory" view along the fluoroscope beam.  Under AP and lateral visualization, the needle was advanced so it did not puncture dura and was located close the 6 O'Clock position of the pedical in AP tracterory. Biplanar projections were used to confirm position. Aspiration was confirmed to be negative for CSF and/or blood. A 1-2 ml. volume of Isovue-250 was injected and flow of contrast was noted at each level. Radiographs were obtained for documentation purposes.   After attaining the  desired flow of contrast documented above, a 0.5 to 1.0 ml test dose of 0.25% Marcaine was injected into each respective transforaminal space.  The patient was observed for 90 seconds post injection.  After no sensory deficits were reported, and normal lower extremity motor function was noted,   the above injectate was administered so that equal amounts of the injectate were placed at each foramen (level) into the transforaminal epidural space.   Additional Comments:  The patient tolerated the procedure well Dressing: 2 x 2 sterile gauze and Band-Aid    Post-procedure details: Patient was observed during the procedure. Post-procedure instructions were reviewed.  Patient left the clinic in stable condition.

## 2021-06-30 ENCOUNTER — Other Ambulatory Visit: Payer: Self-pay | Admitting: Physician Assistant

## 2021-06-30 DIAGNOSIS — M1712 Unilateral primary osteoarthritis, left knee: Secondary | ICD-10-CM

## 2021-07-10 ENCOUNTER — Ambulatory Visit
Admission: RE | Admit: 2021-07-10 | Discharge: 2021-07-10 | Disposition: A | Discharge status: Home / Self Care | Payer: Medicare Other | Source: Ambulatory Visit | Attending: Internal Medicine | Admitting: Internal Medicine

## 2021-07-10 DIAGNOSIS — Z780 Asymptomatic menopausal state: Secondary | ICD-10-CM | POA: Diagnosis not present

## 2021-07-10 DIAGNOSIS — M8589 Other specified disorders of bone density and structure, multiple sites: Secondary | ICD-10-CM | POA: Diagnosis not present

## 2021-07-10 DIAGNOSIS — M85859 Other specified disorders of bone density and structure, unspecified thigh: Secondary | ICD-10-CM

## 2021-07-11 ENCOUNTER — Encounter (HOSPITAL_COMMUNITY): Admission: RE | Admit: 2021-07-11 | Payer: Medicare Other | Source: Ambulatory Visit

## 2021-07-14 NOTE — Progress Notes (Addendum)
Anesthesia Review: ? ?PCP: DR Leeroy Cha  ?Cardiologist : none  ?Chest x-ray : ?EKG : 07/18/21  ?Echo :2021  ?Monitor- 2021  ?Stress test: ?Cardiac Cath :  ?Activity level: can do a flgiht of stairs without difficulty  ?Sleep Study/ CPAP : none  ?Fasting Blood Sugar :      / Checks Blood Sugar -- times a day:   ?Blood Thinner/ Instructions /Last Dose: ?ASA / Instructions/ Last Dose :   ?Preidabetes- checks glucose on occasion at home ?Hgba1c- 07/18/21-  5.8  ?Volunteers in cardiac and pulm rehab at [facility]  ?

## 2021-07-17 ENCOUNTER — Encounter: Payer: Medicare Other | Admitting: Orthopaedic Surgery

## 2021-07-17 NOTE — Progress Notes (Signed)
DUE TO COVID-19 ONLY  2 VISITOR IS ALLOWED TO COME WITH YOU AND STAY IN THE WAITING ROOM ONLY DURING PRE OP AND PROCEDURE DAY OF SURGERY.  4  VISITOR  MAY VISIT WITH YOU AFTER SURGERY IN YOUR PRIVATE ROOM DURING VISITING HOURS ONLY! ?YOU MAY HAVE ONE PERSON SPEND THE NITE WITH YOU IN YOUR ROOM AFTER SURGERY.   ? ? ? ? Your procedure is scheduled on:  ?  07/21/21  ? Report to [facility] Main  Entrance ? ? Report to admitting at    0515            AM ?DO NOT [place], PICTURE ID OR WALLET DAY OF SURGERY.  ?  ? ? Call this number if you have problems the morning of surgery [phone]  ? ? REMEMBER: NO  SOLID FOODS , CANDY, GUM OR MINTS AFTER MIDNITE THE NITE BEFORE SURGERY .       [facility] Kitchen CLEAR LIQUIDS UNTIL    0415am             DAY OF SURGERY.  0415am     PLEASE FINISH ENSURE DRINK PER SURGEON ORDER  WHICH NEEDS TO BE COMPLETED AT         MORNING OF SURGERY.   ? ? ? ? ?CLEAR LIQUID DIET ? ? ?Foods Allowed      ?WATER ?BLACK COFFEE ( SUGAR OK, NO MILK, CREAM OR CREAMER) REGULAR AND DECAF  ?TEA ( SUGAR OK NO MILK, CREAM, OR CREAMER) REGULAR AND DECAF  ?PLAIN JELLO ( NO RED)  ?FRUIT ICES ( NO RED, NO FRUIT PULP)  ?POPSICLES ( NO RED)  ?JUICE- APPLE, WHITE GRAPE AND WHITE CRANBERRY  ?SPORT DRINK LIKE GATORADE ( NO RED)  ?CLEAR BROTH ( VEGETABLE , CHICKEN OR BEEF)                                                               ? ?    ? ?BRUSH YOUR TEETH MORNING OF SURGERY AND RINSE YOUR MOUTH OUT, NO CHEWING GUM CANDY OR MINTS. ?  ? ? Take these medicines the morning of surgery with A SIP OF WATER:  inhalers as usual and brin,g synthroid  ? ? ?DO NOT TAKE ANY DIABETIC MEDICATIONS DAY OF YOUR SURGERY ?                  ?            You may not have any metal on your body including hair pins and  ?            piercings  Do not wear jewelry, make-up, lotions, powders or perfumes, deodorant ?            Do not wear nail polish on your fingernails.   ?           IF YOU ARE A FEMALE AND WANT TO SHAVE UNDER ARMS  OR LEGS PRIOR TO SURGERY YOU MUST DO SO AT LEAST 48 HOURS PRIOR TO SURGERY.  ?            Men may shave face and neck. ? ? Do not bring valuables to the hospital. [place] NOT ?  RESPONSIBLE   FOR VALUABLES. ? Contacts, dentures or bridgework may not be worn into surgery. ? Leave suitcase in the car. After surgery it may be brought to your room. ? ?  ? Patients discharged the day of surgery will not be allowed to drive home. IF YOU ARE HAVING SURGERY AND GOING HOME THE SAME DAY, YOU MUST HAVE AN ADULT TO DRIVE YOU HOME AND BE WITH YOU FOR 24 HOURS. YOU MAY GO HOME BY TAXI OR UBER OR ORTHERWISE, BUT AN ADULT MUST ACCOMPANY YOU HOME AND STAY WITH YOU FOR 24 HOURS. ?  ? ?            Please read over the following fact sheets you were given: ?_____________________________________________________________________ ? ?[facility] - Preparing for Surgery ?Before surgery, you can play an important role.  Because skin is not sterile, your skin needs to be as free of germs as possible.  You can reduce the number of germs on your skin by washing with CHG (chlorahexidine gluconate) soap before surgery.  CHG is an antiseptic cleaner which kills germs and bonds with the skin to continue killing germs even after washing. ?Please DO NOT use if you have an allergy to CHG or antibacterial soaps.  If your skin becomes reddened/irritated stop using the CHG and inform your nurse when you arrive at Short Stay. ?Do not shave (including legs and underarms) for at least 48 hours prior to the first CHG shower.  You may shave your face/neck. ?Please follow these instructions carefully: ? 1.  Shower with CHG Soap the night before surgery and the  morning of Surgery. ? 2.  If you choose to wash your hair, wash your hair first as usual with your  normal  shampoo. ? 3.  After you shampoo, rinse your hair and body thoroughly to remove the  shampoo.                           4.  Use CHG as you would any other liquid soap.  You can  apply chg directly  to the skin and wash  ?                     Gently with a scrungie or clean washcloth. ? 5.  Apply the CHG Soap to your body ONLY FROM THE NECK DOWN.   Do not use on face/ open      ?                     Wound or open sores. Avoid contact with eyes, ears mouth and genitals (private parts).  ?                     Production manager,  Genitals (private parts) with your normal soap. ?            6.  Wash thoroughly, paying special attention to the area where your surgery  will be performed. ? 7.  Thoroughly rinse your body with warm water from the neck down. ? 8.  DO NOT shower/wash with your normal soap after using and rinsing off  the CHG Soap. ?               9.  Pat yourself dry with a clean towel. ?           10.  Wear clean pajamas. ?  11.  Place clean sheets on your bed the night of your first shower and do not  sleep with pets. ?Day of Surgery : ?Do not apply any lotions/deodorants the morning of surgery.  Please wear clean clothes to the hospital/surgery center. ? ?FAILURE TO FOLLOW THESE INSTRUCTIONS MAY RESULT IN THE CANCELLATION OF YOUR SURGERY ?PATIENT SIGNATURE_________________________________ ? ?NURSE SIGNATURE__________________________________ ? ?________________________________________________________________________  ? ? ?           ?

## 2021-07-18 ENCOUNTER — Other Ambulatory Visit: Payer: Self-pay

## 2021-07-18 ENCOUNTER — Encounter (HOSPITAL_COMMUNITY)
Admission: RE | Admit: 2021-07-18 | Discharge: 2021-07-18 | Disposition: A | Discharge status: Home / Self Care | Payer: Medicare Other | Source: Ambulatory Visit | Attending: Orthopaedic Surgery | Admitting: Orthopaedic Surgery

## 2021-07-18 ENCOUNTER — Encounter (HOSPITAL_COMMUNITY): Payer: Self-pay

## 2021-07-18 VITALS — BP 170/72 | HR 66 | Temp 98.2°F | Resp 16 | Ht 64.0 in | Wt 180.0 lb

## 2021-07-18 DIAGNOSIS — M1712 Unilateral primary osteoarthritis, left knee: Secondary | ICD-10-CM | POA: Insufficient documentation

## 2021-07-18 DIAGNOSIS — H2513 Age-related nuclear cataract, bilateral: Secondary | ICD-10-CM | POA: Diagnosis not present

## 2021-07-18 DIAGNOSIS — Z01818 Encounter for other preprocedural examination: Secondary | ICD-10-CM | POA: Diagnosis not present

## 2021-07-18 HISTORY — DX: Prediabetes: R73.03

## 2021-07-18 LAB — CBC
HCT: 42.2 % (ref 36.0–46.0)
Hemoglobin: 13.8 g/dL (ref 12.0–15.0)
MCH: 32 pg (ref 26.0–34.0)
MCHC: 32.7 g/dL (ref 30.0–36.0)
MCV: 97.9 fL (ref 80.0–100.0)
Platelets: 296 10*3/uL (ref 150–400)
RBC: 4.31 MIL/uL (ref 3.87–5.11)
RDW: 12.7 % (ref 11.5–15.5)
WBC: 4.8 10*3/uL (ref 4.0–10.5)
nRBC: 0 % (ref 0.0–0.2)

## 2021-07-18 LAB — BASIC METABOLIC PANEL
Anion gap: 9 (ref 5–15)
BUN: 15 mg/dL (ref 8–23)
CO2: 25 mmol/L (ref 22–32)
Calcium: 9.5 mg/dL (ref 8.9–10.3)
Chloride: 105 mmol/L (ref 98–111)
Creatinine, Ser: 0.84 mg/dL (ref 0.44–1.00)
GFR, Estimated: >60 mL/min (ref >60)
Glucose, Bld: 102 mg/dL — ABNORMAL HIGH (ref 70–99)
Potassium: 3.8 mmol/L (ref 3.5–5.1)
Sodium: 139 mmol/L (ref 135–145)

## 2021-07-18 LAB — SURGICAL PCR SCREEN
MRSA, PCR: NEGATIVE
Staphylococcus aureus: NEGATIVE

## 2021-07-18 LAB — HEMOGLOBIN A1C
Hgb A1c MFr Bld: 5.8 % — ABNORMAL HIGH (ref 4.8–5.6)
Mean Plasma Glucose: 119.76 mg/dL

## 2021-07-18 LAB — GLUCOSE, CAPILLARY: Glucose-Capillary: 100 mg/dL — ABNORMAL HIGH (ref 70–99)

## 2021-07-20 NOTE — H&P (Signed)
TOTAL KNEE ADMISSION H&P ? ?Patient is being admitted for left total knee arthroplasty. ? ?Subjective: ? ?Chief Complaint:left knee pain. ? ?HPI: Allison [name], 76 y.o. female, has a history of pain and functional disability in the left knee due to arthritis and has failed non-surgical conservative treatments for greater than 12 weeks to includeNSAID's and/or analgesics, corticosteriod injections, viscosupplementation injections, flexibility and strengthening excercises, use of assistive devices, and activity modification.  Onset of symptoms was gradual, starting 3 years ago with gradually worsening course since that time. The patient noted no past surgery on the left knee(s).  Patient currently rates pain in the left knee(s) at 10 out of 10 with activity. Patient has night pain, worsening of pain with activity and weight bearing, pain that interferes with activities of daily living, pain with passive range of motion, crepitus, and joint swelling.  Patient has evidence of subchondral sclerosis, periarticular osteophytes, and joint space narrowing by imaging studies. There is no active infection. ? ?Patient Active Problem List  ? Diagnosis Date Noted  ? Unilateral primary osteoarthritis, left knee 02/08/2021  ? Unilateral primary osteoarthritis, left hip 01/11/2021  ? Low back pain 01/11/2021  ? Pain in right shoulder 05/12/2020  ? Pain in right hip 02/10/2020  ? Pain in right foot 02/10/2020  ? Bilateral primary osteoarthritis of knee 12/31/2019  ? Syncope and collapse 09/08/2019  ? Chronic pain of right knee 03/28/2016  ? Unilateral primary osteoarthritis, right knee 03/28/2016  ? Chondrocalcinosis of right knee 03/28/2016  ? Acute bronchitis 03/30/2015  ? HTN (hypertension) 03/04/2013  ? Osteopenia 03/04/2013  ? Dyspnea 01/20/2013  ? Bronchospasm, acute 11/22/2011  ? Urticaria 11/22/2011  ? Family history of colon cancer 09/09/2011  ? Obesity 09/09/2011  ? Hypokalemia 06/18/2011  ? Hyperlipidemia   ? Thyroid  disease   ? Fatty liver disease, nonalcoholic   ? DJD (degenerative joint disease) of hip   ? ?Past Medical History:  ?Diagnosis Date  ? Asthmatic bronchitis   ? Carpal tunnel syndrome   ? Diverticulosis   ? DJD (degenerative joint disease) of hip   ? s/P R hip replacement  ? Elevated serum alkaline phosphatase level   ? Fatty liver disease, nonalcoholic   ? GERD (gastroesophageal reflux disease)   ? Hyperlipidemia   ? Internal hemorrhoids   ? Obesity   ? Osteoarthritis   ? Osteopenia   ? Post-operative nausea and vomiting   ? Pre-diabetes   ? Prediabetes   ? Thyroid disease   ? hypo  ? Vitamin D deficiency   ?  ?Past Surgical History:  ?Procedure Laterality Date  ? CARPAL TUNNEL RELEASE Bilateral   ? DENTAL SURGERY    ? JOINT REPLACEMENT  2004  ? R hip  ? [place], 2008  ? 2 left and 1 right  ? TOE FUSION  2000's  ?  ?No current facility-administered medications for this encounter.  ? ?Current Outpatient Medications  ?Medication Sig Dispense Refill Last Dose  ? albuterol (PROVENTIL HFA;VENTOLIN HFA) 108 (90 BASE) MCG/ACT inhaler Take 2 inhalations tid as needed 1 Inhaler 1   ? atorvastatin (LIPITOR) 20 MG tablet TAKE 1 TABLET EVERY DAY (Patient taking differently: Take 10 mg by mouth daily.) 90 tablet 3   ? calcium carbonate (CALCIUM 600) 600 MG TABS tablet Take 600 mg by mouth 2 (two) times daily with a meal. In the morning. Minocycline to be give 6 hours after fist dose of Calcium     ?  Cholecalciferol (VITAMIN D3) 50 MCG (2000 UT) capsule Take 2,000 Units by mouth daily.     ? diclofenac sodium (VOLTAREN) 1 % GEL Apply 2-4 g topically 4 (four) times daily. (Patient taking differently: Apply 2-4 g topically daily as needed (pain).) 5 Tube 1   ? furosemide (LASIX) 40 MG tablet Take 20 mg by mouth daily.     ? levothyroxine (SYNTHROID) 100 MCG tablet Take 100 mcg by mouth daily before breakfast.     ? minocycline (MINOCIN) 100 MG capsule Take 100 mg by mouth 2 (two) times daily. Take 6 hours  after calcium in the morning. Wait 2 hours  later for the second calcium     ? pimecrolimus (ELIDEL) 1 % cream Apply 1 application. topically in the morning.     ? potassium chloride (MICRO-K) 10 MEQ CR capsule Take 10 mEq by mouth daily.     ? triamcinolone cream (KENALOG) 0.1 % Apply 1 application topically 2 (two) times daily. (Patient taking differently: Apply 1 application. topically daily as needed (Itching).) 30 g 0   ? hydrOXYzine (ATARAX/VISTARIL) 25 MG tablet Take 1 tablet (25 mg total) by mouth every 6 (six) hours. (Patient not taking: Reported on 07/04/2021) 15 tablet 0 Completed Course  ? ?No Known Allergies  ?Social History  ? ?Tobacco Use  ? Smoking status: Former  ?  Packs/day: 2.00  ?  Years: 20.00  ?  Pack years: 40.00  ?  Types: Cigarettes  ?  Quit date: 04/03/1983  ?  Years since quitting: 38.3  ? Smokeless tobacco: Never  ?Substance Use Topics  ? Alcohol use: Yes  ?  Alcohol/week: 7.0 standard drinks  ?  Types: 7 Standard drinks or equivalent per week  ?  Comment: drinks on weekends per pt  ?  ?Family History  ?Problem Relation Age of Onset  ? HIV Brother   ? Colon cancer Mother 45  ? Heart disease Father   ? Ovarian cancer Maternal Aunt 58  ?     lung cancer  ?  ? ?Review of Systems  ?Musculoskeletal:  Positive for gait problem and joint swelling.  ?All other systems reviewed and are negative. ? ?Objective: ? ?Physical Exam ?Vitals reviewed.  ?Constitutional:   ?   Appearance: Normal appearance.  ?HENT:  ?   Head: Normocephalic and atraumatic.  ?Eyes:  ?   Extraocular Movements: Extraocular movements intact.  ?   Pupils: Pupils are equal, round, and reactive to light.  ?Cardiovascular:  ?   Rate and Rhythm: Normal rate.  ?Pulmonary:  ?   Effort: Pulmonary effort is normal.  ?   Breath sounds: Normal breath sounds.  ?Abdominal:  ?   Palpations: Abdomen is soft.  ?Musculoskeletal:  ?   Cervical back: Normal range of motion and neck supple.  ?   Left knee: Effusion, bony tenderness and crepitus  present. Decreased range of motion. Tenderness present over the medial joint line, lateral joint line and patellar tendon. Abnormal alignment.  ?Neurological:  ?   Mental Status: She is alert and oriented to person, place, and time.  ?Psychiatric:     ?   Behavior: Behavior normal.  ? ? ?Vital signs in last 24 hours: ?  ? ?Labs: ? ? ?Estimated body mass index is 30.9 kg/m? as calculated from the following: ?  Height as of 07/18/21: '5\' 4"'$  (1.626 m). ?  Weight as of 07/18/21: 81.6 kg. ? ? ?Imaging Review ?Plain radiographs demonstrate severe degenerative joint disease of  the left knee(s). The overall alignment ismild valgus. The bone quality appears to be good for age and reported activity level. ? ? ? ? ? ?Assessment/Plan: ? ?End stage arthritis, left knee  ? ?The patient history, physical examination, clinical judgment of the provider and imaging studies are consistent with end stage degenerative joint disease of the left knee(s) and total knee arthroplasty is deemed medically necessary. The treatment options including medical management, injection therapy arthroscopy and arthroplasty were discussed at length. The risks and benefits of total knee arthroplasty were presented and reviewed. The risks due to aseptic loosening, infection, stiffness, patella tracking problems, thromboembolic complications and other imponderables were discussed. The patient acknowledged the explanation, agreed to proceed with the plan and consent was signed. Patient is being admitted for inpatient treatment for surgery, pain control, PT, OT, prophylactic antibiotics, VTE prophylaxis, progressive ambulation and ADL's and discharge planning. The patient is planning to be discharged home with home health services ? ? ? ? ?

## 2021-07-21 ENCOUNTER — Encounter (HOSPITAL_COMMUNITY)
Admission: RE | Disposition: A | Discharge status: Skilled Nursing Facility | Payer: Self-pay | Source: Home / Self Care | Attending: Orthopaedic Surgery

## 2021-07-21 ENCOUNTER — Ambulatory Visit (HOSPITAL_COMMUNITY): Payer: Medicare Other | Admitting: Physician Assistant

## 2021-07-21 ENCOUNTER — Ambulatory Visit (HOSPITAL_BASED_OUTPATIENT_CLINIC_OR_DEPARTMENT_OTHER): Payer: Medicare Other | Admitting: Anesthesiology

## 2021-07-21 ENCOUNTER — Observation Stay (HOSPITAL_COMMUNITY): Payer: Medicare Other

## 2021-07-21 ENCOUNTER — Other Ambulatory Visit: Payer: Self-pay

## 2021-07-21 ENCOUNTER — Encounter (HOSPITAL_COMMUNITY): Payer: Self-pay | Admitting: Orthopaedic Surgery

## 2021-07-21 ENCOUNTER — Inpatient Hospital Stay (HOSPITAL_COMMUNITY)
Admission: RE | Admit: 2021-07-21 | Discharge: 2021-07-24 | DRG: 470 | Disposition: A | Discharge status: Skilled Nursing Facility | Payer: Medicare Other | Attending: Orthopaedic Surgery | Admitting: Orthopaedic Surgery

## 2021-07-21 DIAGNOSIS — Z800 Family history of malignant neoplasm of digestive organs: Secondary | ICD-10-CM | POA: Diagnosis not present

## 2021-07-21 DIAGNOSIS — Z8249 Family history of ischemic heart disease and other diseases of the circulatory system: Secondary | ICD-10-CM | POA: Diagnosis not present

## 2021-07-21 DIAGNOSIS — K760 Fatty (change of) liver, not elsewhere classified: Secondary | ICD-10-CM | POA: Diagnosis present

## 2021-07-21 DIAGNOSIS — Z8041 Family history of malignant neoplasm of ovary: Secondary | ICD-10-CM | POA: Diagnosis not present

## 2021-07-21 DIAGNOSIS — E785 Hyperlipidemia, unspecified: Secondary | ICD-10-CM | POA: Diagnosis present

## 2021-07-21 DIAGNOSIS — R7303 Prediabetes: Secondary | ICD-10-CM | POA: Diagnosis present

## 2021-07-21 DIAGNOSIS — K219 Gastro-esophageal reflux disease without esophagitis: Secondary | ICD-10-CM | POA: Diagnosis not present

## 2021-07-21 DIAGNOSIS — Z9889 Other specified postprocedural states: Secondary | ICD-10-CM | POA: Diagnosis not present

## 2021-07-21 DIAGNOSIS — M1712 Unilateral primary osteoarthritis, left knee: Principal | ICD-10-CM | POA: Diagnosis present

## 2021-07-21 DIAGNOSIS — G8918 Other acute postprocedural pain: Secondary | ICD-10-CM | POA: Diagnosis not present

## 2021-07-21 DIAGNOSIS — Z96642 Presence of left artificial hip joint: Secondary | ICD-10-CM | POA: Diagnosis not present

## 2021-07-21 DIAGNOSIS — Z96641 Presence of right artificial hip joint: Secondary | ICD-10-CM | POA: Diagnosis not present

## 2021-07-21 DIAGNOSIS — Z87891 Personal history of nicotine dependence: Secondary | ICD-10-CM | POA: Diagnosis not present

## 2021-07-21 DIAGNOSIS — M8580 Other specified disorders of bone density and structure, unspecified site: Secondary | ICD-10-CM | POA: Diagnosis present

## 2021-07-21 DIAGNOSIS — Z6830 Body mass index (BMI) 30.0-30.9, adult: Secondary | ICD-10-CM | POA: Diagnosis not present

## 2021-07-21 DIAGNOSIS — J45909 Unspecified asthma, uncomplicated: Secondary | ICD-10-CM | POA: Diagnosis present

## 2021-07-21 DIAGNOSIS — Z801 Family history of malignant neoplasm of trachea, bronchus and lung: Secondary | ICD-10-CM

## 2021-07-21 DIAGNOSIS — R262 Difficulty in walking, not elsewhere classified: Secondary | ICD-10-CM | POA: Diagnosis not present

## 2021-07-21 DIAGNOSIS — I10 Essential (primary) hypertension: Secondary | ICD-10-CM | POA: Diagnosis not present

## 2021-07-21 DIAGNOSIS — M7989 Other specified soft tissue disorders: Secondary | ICD-10-CM | POA: Diagnosis not present

## 2021-07-21 DIAGNOSIS — M6281 Muscle weakness (generalized): Secondary | ICD-10-CM | POA: Diagnosis not present

## 2021-07-21 DIAGNOSIS — Z96652 Presence of left artificial knee joint: Secondary | ICD-10-CM

## 2021-07-21 DIAGNOSIS — D62 Acute posthemorrhagic anemia: Secondary | ICD-10-CM | POA: Diagnosis not present

## 2021-07-21 DIAGNOSIS — Z79899 Other long term (current) drug therapy: Secondary | ICD-10-CM

## 2021-07-21 DIAGNOSIS — R1312 Dysphagia, oropharyngeal phase: Secondary | ICD-10-CM | POA: Diagnosis not present

## 2021-07-21 DIAGNOSIS — Z471 Aftercare following joint replacement surgery: Secondary | ICD-10-CM | POA: Diagnosis not present

## 2021-07-21 HISTORY — PX: TOTAL KNEE ARTHROPLASTY: SHX125

## 2021-07-21 LAB — GLUCOSE, CAPILLARY
Glucose-Capillary: 252 mg/dL — ABNORMAL HIGH (ref 70–99)
Glucose-Capillary: 204 mg/dL — ABNORMAL HIGH (ref 70–99)
Glucose-Capillary: 198 mg/dL — ABNORMAL HIGH (ref 70–99)

## 2021-07-21 LAB — ABO/RH: ABO/RH(D): A POS

## 2021-07-21 LAB — TYPE AND SCREEN
ABO/RH(D): A POS
Antibody Screen: NEGATIVE

## 2021-07-21 SURGERY — ARTHROPLASTY, KNEE, TOTAL
Anesthesia: General | Site: Knee | Laterality: Left

## 2021-07-21 MED ORDER — POTASSIUM CHLORIDE CRYS ER 10 MEQ PO TBCR
10.0000 meq | EXTENDED_RELEASE_TABLET | Freq: Every day | ORAL | Status: DC
  Administered 2021-07-22 – 2021-07-24 (×3): 10 meq via ORAL
  Filled 2021-07-21 (×3): qty 1

## 2021-07-21 MED ORDER — ALBUTEROL SULFATE (2.5 MG/3ML) 0.083% IN NEBU: 2.5000 mg | INHALATION_SOLUTION | Freq: Four times a day (QID) | RESPIRATORY_TRACT | Status: DC | PRN

## 2021-07-21 MED ORDER — TRANEXAMIC ACID-NACL 1000-0.7 MG/100ML-% IV SOLN
1000.0000 mg | INTRAVENOUS | Status: AC
  Administered 2021-07-21: 1000 mg via INTRAVENOUS
  Filled 2021-07-21: qty 100

## 2021-07-21 MED ORDER — HYDROMORPHONE HCL 1 MG/ML IJ SOLN
INTRAMUSCULAR | Status: DC | PRN
  Administered 2021-07-21 (×4): .5 mg via INTRAVENOUS

## 2021-07-21 MED ORDER — METHOCARBAMOL 1000 MG/10ML IJ SOLN
500.0000 mg | Freq: Three times a day (TID) | INTRAVENOUS | Status: DC | PRN
  Filled 2021-07-21: qty 5

## 2021-07-21 MED ORDER — PANTOPRAZOLE SODIUM 40 MG PO TBEC
40.0000 mg | DELAYED_RELEASE_TABLET | Freq: Every day | ORAL | Status: DC
  Administered 2021-07-22 – 2021-07-24 (×3): 40 mg via ORAL
  Filled 2021-07-21 (×3): qty 1

## 2021-07-21 MED ORDER — FENTANYL CITRATE (PF) 100 MCG/2ML IJ SOLN
INTRAMUSCULAR | Status: DC | PRN
  Administered 2021-07-21 (×4): 50 ug via INTRAVENOUS

## 2021-07-21 MED ORDER — METOCLOPRAMIDE HCL 5 MG PO TABS: 5.0000 mg | ORAL_TABLET | Freq: Three times a day (TID) | ORAL | Status: DC | PRN

## 2021-07-21 MED ORDER — OXYCODONE HCL 5 MG PO TABS
10.0000 mg | ORAL_TABLET | ORAL | Status: DC | PRN
  Administered 2021-07-22 (×2): 10 mg via ORAL
  Filled 2021-07-21: qty 2

## 2021-07-21 MED ORDER — ONDANSETRON HCL 4 MG/2ML IJ SOLN
INTRAMUSCULAR | Status: DC | PRN
  Administered 2021-07-21: 4 mg via INTRAVENOUS

## 2021-07-21 MED ORDER — HYDROMORPHONE HCL 2 MG/ML IJ SOLN
INTRAMUSCULAR | Status: AC
  Filled 2021-07-21: qty 1

## 2021-07-21 MED ORDER — ATORVASTATIN CALCIUM 10 MG PO TABS
10.0000 mg | ORAL_TABLET | Freq: Every day | ORAL | Status: DC
  Administered 2021-07-22 – 2021-07-24 (×3): 10 mg via ORAL
  Filled 2021-07-21 (×3): qty 1

## 2021-07-21 MED ORDER — BUPIVACAINE-EPINEPHRINE 0.25% -1:200000 IJ SOLN
INTRAMUSCULAR | Status: DC | PRN
  Administered 2021-07-21: 30 mL

## 2021-07-21 MED ORDER — SODIUM CHLORIDE 0.9 % IV SOLN: INTRAVENOUS | Status: DC

## 2021-07-21 MED ORDER — ONDANSETRON HCL 4 MG/2ML IJ SOLN
4.0000 mg | Freq: Four times a day (QID) | INTRAMUSCULAR | Status: DC | PRN
  Administered 2021-07-21 (×2): 4 mg via INTRAVENOUS
  Filled 2021-07-21 (×2): qty 2

## 2021-07-21 MED ORDER — DEXAMETHASONE SODIUM PHOSPHATE 10 MG/ML IJ SOLN
INTRAMUSCULAR | Status: DC | PRN
Start: 2021-07-21 — End: 2021-07-21
  Administered 2021-07-21: 6 mg via INTRAVENOUS

## 2021-07-21 MED ORDER — BUPIVACAINE-EPINEPHRINE (PF) 0.25% -1:200000 IJ SOLN
INTRAMUSCULAR | Status: AC
  Filled 2021-07-21: qty 30

## 2021-07-21 MED ORDER — VITAMIN D 25 MCG (1000 UNIT) PO TABS
2000.0000 [IU] | ORAL_TABLET | Freq: Every day | ORAL | Status: DC
  Administered 2021-07-22 – 2021-07-24 (×3): 2000 [IU] via ORAL
  Filled 2021-07-21 (×3): qty 2

## 2021-07-21 MED ORDER — ROPIVACAINE HCL 5 MG/ML IJ SOLN
INTRAMUSCULAR | Status: DC | PRN
  Administered 2021-07-21: 20 mL via PERINEURAL

## 2021-07-21 MED ORDER — POVIDONE-IODINE 10 % EX SWAB
2.0000 "application " | Freq: Once | CUTANEOUS | Status: AC
  Administered 2021-07-21: 2 via TOPICAL

## 2021-07-21 MED ORDER — LEVOTHYROXINE SODIUM 100 MCG PO TABS
100.0000 ug | ORAL_TABLET | Freq: Every day | ORAL | Status: DC
  Administered 2021-07-22 – 2021-07-24 (×3): 100 ug via ORAL
  Filled 2021-07-21 (×3): qty 1

## 2021-07-21 MED ORDER — ORAL CARE MOUTH RINSE: 15.0000 mL | Freq: Once | OROMUCOSAL | Status: AC

## 2021-07-21 MED ORDER — ONDANSETRON HCL 4 MG/2ML IJ SOLN
INTRAMUSCULAR | Status: AC
  Filled 2021-07-21: qty 2

## 2021-07-21 MED ORDER — CALCIUM CARBONATE 1250 (500 CA) MG PO TABS
1250.0000 mg | ORAL_TABLET | Freq: Two times a day (BID) | ORAL | Status: DC
  Administered 2021-07-21 – 2021-07-24 (×6): 1250 mg via ORAL
  Filled 2021-07-21 (×6): qty 1

## 2021-07-21 MED ORDER — MIDAZOLAM HCL 2 MG/2ML IJ SOLN
INTRAMUSCULAR | Status: AC
  Filled 2021-07-21: qty 2

## 2021-07-21 MED ORDER — FENTANYL CITRATE (PF) 100 MCG/2ML IJ SOLN
INTRAMUSCULAR | Status: AC
Start: 2021-07-21 — End: ?
  Filled 2021-07-21: qty 2

## 2021-07-21 MED ORDER — PROPOFOL 10 MG/ML IV BOLUS
INTRAVENOUS | Status: DC | PRN
  Administered 2021-07-21: 200 ug via INTRAVENOUS
  Administered 2021-07-21: 30 ug via INTRAVENOUS

## 2021-07-21 MED ORDER — STERILE WATER FOR IRRIGATION IR SOLN
Status: DC | PRN
  Administered 2021-07-21: 2000 mL

## 2021-07-21 MED ORDER — OXYCODONE HCL 5 MG PO TABS
5.0000 mg | ORAL_TABLET | ORAL | Status: DC | PRN
  Administered 2021-07-21 – 2021-07-22 (×2): 5 mg via ORAL
  Administered 2021-07-23: 10 mg via ORAL
  Filled 2021-07-21: qty 1
  Filled 2021-07-21: qty 2
  Filled 2021-07-21: qty 1
  Filled 2021-07-21: qty 2

## 2021-07-21 MED ORDER — 0.9 % SODIUM CHLORIDE (POUR BTL) OPTIME
TOPICAL | Status: DC | PRN
  Administered 2021-07-21: 1000 mL

## 2021-07-21 MED ORDER — CHLORHEXIDINE GLUCONATE 0.12 % MT SOLN
15.0000 mL | Freq: Once | OROMUCOSAL | Status: AC
  Administered 2021-07-21: 15 mL via OROMUCOSAL

## 2021-07-21 MED ORDER — ACETAMINOPHEN 500 MG PO TABS
1000.0000 mg | ORAL_TABLET | Freq: Once | ORAL | Status: AC
  Administered 2021-07-21: 1000 mg via ORAL
  Filled 2021-07-21: qty 2

## 2021-07-21 MED ORDER — DOCUSATE SODIUM 100 MG PO CAPS
100.0000 mg | ORAL_CAPSULE | Freq: Two times a day (BID) | ORAL | Status: DC
  Administered 2021-07-21 – 2021-07-24 (×6): 100 mg via ORAL
  Filled 2021-07-21 (×6): qty 1

## 2021-07-21 MED ORDER — FUROSEMIDE 20 MG PO TABS
20.0000 mg | ORAL_TABLET | Freq: Every day | ORAL | Status: DC
  Administered 2021-07-22 – 2021-07-24 (×3): 20 mg via ORAL
  Filled 2021-07-21 (×3): qty 1

## 2021-07-21 MED ORDER — GLYCOPYRROLATE 0.2 MG/ML IJ SOLN
INTRAMUSCULAR | Status: DC | PRN
  Administered 2021-07-21: .1 mg via INTRAVENOUS

## 2021-07-21 MED ORDER — ACETAMINOPHEN 325 MG PO TABS
325.0000 mg | ORAL_TABLET | Freq: Four times a day (QID) | ORAL | Status: DC | PRN
  Administered 2021-07-21 – 2021-07-24 (×8): 650 mg via ORAL
  Filled 2021-07-21 (×9): qty 2

## 2021-07-21 MED ORDER — ALUM & MAG HYDROXIDE-SIMETH 200-200-20 MG/5ML PO SUSP: 30.0000 mL | ORAL | Status: DC | PRN

## 2021-07-21 MED ORDER — AMISULPRIDE (ANTIEMETIC) 5 MG/2ML IV SOLN: 10.0000 mg | Freq: Once | INTRAVENOUS | Status: DC | PRN

## 2021-07-21 MED ORDER — CEFAZOLIN SODIUM-DEXTROSE 1-4 GM/50ML-% IV SOLN
1.0000 g | Freq: Four times a day (QID) | INTRAVENOUS | Status: AC
  Administered 2021-07-21 (×2): 1 g via INTRAVENOUS
  Filled 2021-07-21 (×2): qty 50

## 2021-07-21 MED ORDER — ONDANSETRON HCL 4 MG PO TABS: 4.0000 mg | ORAL_TABLET | Freq: Four times a day (QID) | ORAL | Status: DC | PRN

## 2021-07-21 MED ORDER — FENTANYL CITRATE (PF) 100 MCG/2ML IJ SOLN
INTRAMUSCULAR | Status: AC
  Filled 2021-07-21: qty 2

## 2021-07-21 MED ORDER — MINOCYCLINE HCL 50 MG PO CAPS
100.0000 mg | ORAL_CAPSULE | Freq: Two times a day (BID) | ORAL | Status: DC
  Administered 2021-07-21 – 2021-07-23 (×5): 100 mg via ORAL
  Filled 2021-07-21: qty 1
  Filled 2021-07-21 (×7): qty 2

## 2021-07-21 MED ORDER — METHOCARBAMOL 500 MG PO TABS
500.0000 mg | ORAL_TABLET | Freq: Three times a day (TID) | ORAL | Status: DC | PRN
  Administered 2021-07-21 – 2021-07-24 (×6): 500 mg via ORAL
  Filled 2021-07-21 (×7): qty 1

## 2021-07-21 MED ORDER — DEXAMETHASONE SODIUM PHOSPHATE 10 MG/ML IJ SOLN
INTRAMUSCULAR | Status: AC
  Filled 2021-07-21: qty 1

## 2021-07-21 MED ORDER — INSULIN ASPART 100 UNIT/ML IJ SOLN
0.0000 [IU] | Freq: Three times a day (TID) | INTRAMUSCULAR | Status: DC
  Administered 2021-07-21: 3 [IU] via SUBCUTANEOUS
  Administered 2021-07-22 (×2): 2 [IU] via SUBCUTANEOUS
  Administered 2021-07-23: 3 [IU] via SUBCUTANEOUS
  Administered 2021-07-23 – 2021-07-24 (×2): 2 [IU] via SUBCUTANEOUS

## 2021-07-21 MED ORDER — LIDOCAINE HCL (CARDIAC) PF 100 MG/5ML IV SOSY
PREFILLED_SYRINGE | INTRAVENOUS | Status: DC | PRN
  Administered 2021-07-21: 60 mg via INTRAVENOUS

## 2021-07-21 MED ORDER — DIPHENHYDRAMINE HCL 50 MG/ML IJ SOLN
INTRAMUSCULAR | Status: DC | PRN
Start: 2021-07-21 — End: 2021-07-21
  Administered 2021-07-21: 12.5 mg via INTRAVENOUS

## 2021-07-21 MED ORDER — CEFAZOLIN SODIUM-DEXTROSE 2-4 GM/100ML-% IV SOLN
2.0000 g | INTRAVENOUS | Status: AC
  Administered 2021-07-21: 2 g via INTRAVENOUS
  Filled 2021-07-21: qty 100

## 2021-07-21 MED ORDER — ASPIRIN 81 MG PO CHEW
81.0000 mg | CHEWABLE_TABLET | Freq: Two times a day (BID) | ORAL | Status: DC
  Administered 2021-07-21 – 2021-07-24 (×6): 81 mg via ORAL
  Filled 2021-07-21 (×6): qty 1

## 2021-07-21 MED ORDER — HYDROMORPHONE HCL 1 MG/ML IJ SOLN: 0.5000 mg | INTRAMUSCULAR | Status: DC | PRN

## 2021-07-21 MED ORDER — SODIUM CHLORIDE 0.9 % IR SOLN
Status: DC | PRN
  Administered 2021-07-21: 1000 mL

## 2021-07-21 MED ORDER — LIDOCAINE HCL (PF) 2 % IJ SOLN
INTRAMUSCULAR | Status: AC
  Filled 2021-07-21: qty 5

## 2021-07-21 MED ORDER — MENTHOL 3 MG MT LOZG: 1.0000 | LOZENGE | OROMUCOSAL | Status: DC | PRN

## 2021-07-21 MED ORDER — LACTATED RINGERS IV SOLN
INTRAVENOUS | Status: DC
Start: 2021-07-21 — End: 2021-07-21

## 2021-07-21 MED ORDER — HYDROMORPHONE HCL 1 MG/ML IJ SOLN
INTRAMUSCULAR | Status: AC
  Filled 2021-07-21: qty 1

## 2021-07-21 MED ORDER — PHENOL 1.4 % MT LIQD: 1.0000 | OROMUCOSAL | Status: DC | PRN

## 2021-07-21 MED ORDER — METOCLOPRAMIDE HCL 5 MG/ML IJ SOLN
5.0000 mg | Freq: Three times a day (TID) | INTRAMUSCULAR | Status: DC | PRN
  Administered 2021-07-21: 10 mg via INTRAVENOUS
  Filled 2021-07-21: qty 2

## 2021-07-21 MED ORDER — HYDROMORPHONE HCL 1 MG/ML IJ SOLN
0.2500 mg | INTRAMUSCULAR | Status: DC | PRN
  Administered 2021-07-21 (×3): 0.5 mg via INTRAVENOUS

## 2021-07-21 SURGICAL SUPPLY — 63 items
APL SKNCLS STERI-STRIP NONHPOA (GAUZE/BANDAGES/DRESSINGS)
AUG MED ASF PS 4-5 C-D 10 (Joint) ×2 IMPLANT
AUGMENT MED ASF PS 4-5 C-D 10 (Joint) IMPLANT
BAG COUNTER SPONGE SURGICOUNT (BAG) ×1 IMPLANT
BAG SPEC THK2 15X12 ZIP CLS (MISCELLANEOUS)
BAG SPNG CNTER NS LX DISP (BAG) ×1
BAG ZIPLOCK 12X15 (MISCELLANEOUS) ×2 IMPLANT
BENZOIN TINCTURE PRP APPL 2/3 (GAUZE/BANDAGES/DRESSINGS) IMPLANT
BLADE SAG 18X100X1.27 (BLADE) ×2 IMPLANT
BLADE SURG SZ10 CARB STEEL (BLADE) ×6 IMPLANT
BNDG ELASTIC 6X5.8 VLCR STR LF (GAUZE/BANDAGES/DRESSINGS) ×5 IMPLANT
BOWL SMART MIX CTS (DISPOSABLE) ×1 IMPLANT
BSPLAT TIB 5D D CMNT STM LT (Knees) ×1 IMPLANT
CEMENT BONE R 1X40 (Cement) ×2 IMPLANT
COMP FEM CMT PS STD 5 LT (Joint) ×2 IMPLANT
COMPONENT FEM CMT PS STD 5 LT (Joint) IMPLANT
COOLER ICEMAN CLASSIC (MISCELLANEOUS) ×3 IMPLANT
COVER SURGICAL LIGHT HANDLE (MISCELLANEOUS) ×3 IMPLANT
CUFF TOURN SGL QUICK 34 (TOURNIQUET CUFF) ×2
CUFF TRNQT CYL 34X4.125X (TOURNIQUET CUFF) ×2 IMPLANT
DRAPE INCISE IOBAN 66X45 STRL (DRAPES) ×3 IMPLANT
DRAPE U-SHAPE 47X51 STRL (DRAPES) ×3 IMPLANT
DRSG PAD ABDOMINAL 8X10 ST (GAUZE/BANDAGES/DRESSINGS) ×6 IMPLANT
DURAPREP 26ML APPLICATOR (WOUND CARE) ×3 IMPLANT
ELECT BLADE TIP CTD 4 INCH (ELECTRODE) ×3 IMPLANT
ELECT REM PT RETURN 15FT ADLT (MISCELLANEOUS) ×3 IMPLANT
GAUZE SPONGE 4X4 12PLY STRL (GAUZE/BANDAGES/DRESSINGS) ×3 IMPLANT
GAUZE XEROFORM 1X8 LF (GAUZE/BANDAGES/DRESSINGS) ×1 IMPLANT
GLOVE BIO SURGEON STRL SZ7.5 (GLOVE) ×3 IMPLANT
GLOVE BIOGEL PI IND STRL 8 (GLOVE) ×4 IMPLANT
GLOVE BIOGEL PI INDICATOR 8 (GLOVE) ×2
GLOVE ECLIPSE 8.0 STRL XLNG CF (GLOVE) ×3 IMPLANT
GOWN STRL REUS W/ TWL XL LVL3 (GOWN DISPOSABLE) ×4 IMPLANT
GOWN STRL REUS W/TWL XL LVL3 (GOWN DISPOSABLE) ×8
HANDPIECE INTERPULSE COAX TIP (DISPOSABLE) ×2
HDLS TROCR DRIL PIN KNEE 75 (PIN) ×2
HOLDER FOLEY CATH W/STRAP (MISCELLANEOUS) IMPLANT
IMMOBILIZER KNEE 20 (SOFTGOODS) ×2
IMMOBILIZER KNEE 20 THIGH 36 (SOFTGOODS) ×2 IMPLANT
KIT TURNOVER KIT A (KITS) ×1 IMPLANT
NS IRRIG 1000ML POUR BTL (IV SOLUTION) ×3 IMPLANT
PACK TOTAL KNEE CUSTOM (KITS) ×3 IMPLANT
PAD COLD SHLDR WRAP-ON (PAD) ×3 IMPLANT
PADDING CAST COTTON 6X4 STRL (CAST SUPPLIES) ×6 IMPLANT
PIN DRILL HDLS TROCAR 75 4PK (PIN) IMPLANT
PROTECTOR NERVE ULNAR (MISCELLANEOUS) ×3 IMPLANT
SCREW FEMALE HEX FIX 25X2.5 (ORTHOPEDIC DISPOSABLE SUPPLIES) ×1 IMPLANT
SET HNDPC FAN SPRY TIP SCT (DISPOSABLE) ×2 IMPLANT
SET PAD KNEE POSITIONER (MISCELLANEOUS) ×3 IMPLANT
SPIKE FLUID TRANSFER (MISCELLANEOUS) IMPLANT
STAPLER VISISTAT 35W (STAPLE) IMPLANT
STEM POLY PAT PLY 32M KNEE (Knees) ×1 IMPLANT
STEM TIBIA 5 DEG SZ D L KNEE (Knees) IMPLANT
STRIP CLOSURE SKIN 1/2X4 (GAUZE/BANDAGES/DRESSINGS) IMPLANT
SUT MNCRL AB 4-0 PS2 18 (SUTURE) IMPLANT
SUT VIC AB 0 CT1 27 (SUTURE) ×2
SUT VIC AB 0 CT1 27XBRD ANTBC (SUTURE) ×2 IMPLANT
SUT VIC AB 1 CT1 36 (SUTURE) ×6 IMPLANT
SUT VIC AB 2-0 CT1 27 (SUTURE) ×4
SUT VIC AB 2-0 CT1 TAPERPNT 27 (SUTURE) ×4 IMPLANT
TIBIA STEM 5 DEG SZ D L KNEE (Knees) ×2 IMPLANT
TRAY FOLEY MTR SLVR 16FR STAT (SET/KITS/TRAYS/PACK) IMPLANT
WATER STERILE IRR 1000ML POUR (IV SOLUTION) ×6 IMPLANT

## 2021-07-21 NOTE — Op Note (Signed)
NAME: [name], [name] ?MEDICAL RECORD NO: [id number] ?ACCOUNT NO: [id number] ?DATE OF BIRTH: March 17, 1946 ?FACILITY: [facility] ?LOCATION: [id number] ?PHYSICIAN: Lind Guest. Ninfa Linden, MD ? ?Operative Report  ? ?DATE OF PROCEDURE: 07/21/2021 ? ?PREOPERATIVE DIAGNOSES:  Severe osteoarthritis and degenerative joint disease, left knee. ? ?POSTOPERATIVE DIAGNOSES:  Severe osteoarthritis and degenerative joint disease, left knee. ? ?PROCEDURE:  Left total knee arthroplasty. ? ?IMPLANTS:  Biomet/Zimmer Persona cemented knee system with size 5 left, CR standard femur, size D left tibial tray, size 32 mm patellar button, left medial congruent 10 mm thickness polyethylene insert. ? ?SURGEON:  Lind Guest. Ninfa Linden, MD ? ?ASSISTANT:  Erskine Emery, PA-C. ? ?ANESTHESIA:   ?1.  Left lower extremity adductor canal block. ?2.  General. ? ?ANTIBIOTICS:  2 g IV Ancef. ? ?TOURNIQUET TIME:  Less than 1 hour. ? ?ESTIMATED BLOOD LOSS:  Less than 100 mL ? ?COMPLICATIONS:  None. ? ?INDICATIONS:  The patient is a 76 year old patient well known to me.  She has severe arthritis of her left knee that has been well documented with clinical exam and x-rays.  At this point, her left knee pain is daily and it is detrimentally affecting her ? mobility, her quality of life and activities of daily living.  She has tried and failed all forms of conservative treatment and does wish to proceed with a knee replacement.  We talked in length and detail the risk of acute blood loss anemia, nerve or  ?vessel injury, fracture, infection, DVT, implant failure and skin and soft tissue issues.  We talked about our goals being decreased pain, improve mobility and overall improve quality of life. ? ?DESCRIPTION OF PROCEDURE:  After informed consent was obtained, appropriate left knee was marked.  She was brought to the operating room after an adductor canal block was obtained.  The left lower extremity in the holding room. In the operating room she  ?was placed supine  on the operating table and general anesthesia was then obtained.  A nonsterile tourniquet was placed around her upper left thigh and her left thigh, knee, leg and ankle were prepped and draped with DuraPrep and sterile drapes including  ?a sterile stockinette.  A timeout was called and she was identified correct patient, correct left knee.  We then used Esmarch to wrap that leg and tourniquet was inflated to 300 mm of pressure.  I then made a direct midline incision over the patella and  ?carried this proximally and distally.  We dissected down the knee joint, carried out a medial parapatellar arthrotomy, finding very large joint effusion.  Of note, she does have slight valgus malalignment. With the knee in a flexed position, we removed  ?remnants of the ACL, medial and lateral meniscus and osteophytes from all three compartments.  We found degenerative wear in all three compartments, especially the lateral compartment.  With the knee in a flexed position, we used the extramedullary  ?cutting guide for making our proximal tibia cut.  We corrected for varus and valgus and slope of 3 degrees.  We made this cut to take 8 mm off the high side.  We made this cut without difficulty.  We then used an intramedullary cutting guide for the  ?distal femoral cut, setting this for left knee at 5 degrees externally rotated and a 10 mm distal femoral cut.  We made this cut without difficulty, and brought the knee back down to full extension with a 10 mm extension block and achieved full  ?extension.  We then went  back to the femur and put our femoral sizing guide based off the epicondylar axis.  Based off of this, we chose a size 5 femur.  We put a 4-in-1 cutting block for a size 5 femur, made our anterior and posterior cuts, followed by  ?our chamfer cuts.  We then went back to the tibia and chose a size D left tibial tray for coverage on the tibial plateau setting the rotation off the tibial tubercle and the femur.  We did our  keel punch and drill hole off of this.  With a size D trial  ?left tibia, we trialled a right standard size 5 CR femur.  We placed a 10 mm medial congruent fixed bearing polyethylene insert.  We were pleased with range of motion and stability without insert.  We then made our patellar cut and drilled three holes  ?for a size 32 patellar button.  With all trial instrumentation in the knee again we put her through several cycles of motion I was pleased with range of motion and stability.  We then removed all trial instrumentation from the knee and irrigated the knee ? with normal saline solution.  We then placed Marcaine and epinephrine around the arthrotomy.  With the knee in a flexed position, we mixed our cement and then cemented our Biomed Zimmer Persona tibial tray, size D for left knee followed by cementing our ? size left CR standard femur, which was a 5.  We then placed our 10 mm medial congruent fixed bearing polyethylene insert and cemented our size 32 patellar button.  We then removed all excess cement debris from the knee and held the knee fully extended  ?and compressed while the cement hardened.  Once it hardened, we let the tourniquet down.  Hemostasis was obtained with electrocautery.  We closed the arthrotomy with interrupted #1 Vicryl suture followed by 0 Vicryl to close the deep tissue and 2-0  ?Vicryl to close subcutaneous tissue.  The skin was closed with staples.  A well-padded sterile dressing was applied.  She was awakened, extubated, and taken to recovery room in stable condition with all final counts being correct and no complications  ?noted.  Of note, Benita Stabile, PA-C, his assistance was medically necessary and needed throughout the entire case for retraction of soft tissues and mobilizing soft tissues as well as helping guide the implant placement.  He was involved in a direct layered ? closure of the wound. ? ? ?PUS ?D: 07/21/2021 8:36:46 am T: 07/21/2021 8:42:00 pm  ?JOB: [id number]/  [id number]  ?

## 2021-07-21 NOTE — Anesthesia Preprocedure Evaluation (Addendum)
Anesthesia Evaluation  ?Patient identified by MRN, date of birth, ID band ?Patient awake ? ? ? ?Reviewed: ?Allergy & Precautions, NPO status , Patient's Chart, lab work & pertinent test results ? ?History of Anesthesia Complications ?(+) PONV and history of anesthetic complications ? ?Airway ?Mallampati: II ? ?TM Distance: >3 FB ?Neck ROM: Full ? ? ? Dental ?  ?Pulmonary ?asthma , former smoker,  ?  ?breath sounds clear to auscultation ? ? ? ? ? ? Cardiovascular ?hypertension,  ?Rhythm:Regular Rate:Normal ? ? ?  ?Neuro/Psych ? Neuromuscular disease   ? GI/Hepatic ?Neg liver ROS, GERD  ,  ?Endo/Other  ?negative endocrine ROS ? Renal/GU ?negative Renal ROS  ? ?  ?Musculoskeletal ? ?(+) Arthritis ,  ? Abdominal ?  ?Peds ? Hematology ?negative hematology ROS ?(+)   ?Anesthesia Other Findings ? ? Reproductive/Obstetrics ? ?  ? ? ? ? ? ? ? ? ? ? ? ? ? ?  ?  ? ? ? ? ? ? ? ?Lab Results  ?Component Value Date  ? WBC 4.8 07/18/2021  ? HGB 13.8 07/18/2021  ? HCT 42.2 07/18/2021  ? MCV 97.9 07/18/2021  ? PLT 296 07/18/2021  ? ?Lab Results  ?Component Value Date  ? CREATININE 0.84 07/18/2021  ? BUN 15 07/18/2021  ? NA 139 07/18/2021  ? K 3.8 07/18/2021  ? CL 105 07/18/2021  ? CO2 25 07/18/2021  ? ? ?Anesthesia Physical ?Anesthesia Plan ? ?ASA: 2 ? ?Anesthesia Plan: General  ? ?Post-op Pain Management: Regional block*, Tylenol PO (pre-op)* and Toradol IV (intra-op)*  ? ?Induction: Intravenous ? ?PONV Risk Score and Plan: 4 or greater and Dexamethasone, Ondansetron, Propofol infusion and Treatment may vary due to age or medical condition ? ?Airway Management Planned: LMA ? ?Additional Equipment: None ? ?Intra-op Plan:  ? ?Post-operative Plan: Extubation in OR ? ?Informed Consent: I have reviewed the patients History and Physical, chart, labs and discussed the procedure including the risks, benefits and alternatives for the proposed anesthesia with the patient or authorized representative who has  indicated his/her understanding and acceptance.  ? ? ? ?Dental advisory given ? ?Plan Discussed with: CRNA ? ?Anesthesia Plan Comments:   ? ? ? ? ? ?Anesthesia Quick Evaluation ? ?

## 2021-07-21 NOTE — Anesthesia Procedure Notes (Signed)
Anesthesia Regional Block: Adductor canal block  ? ?Pre-Anesthetic Checklist: , timeout performed,  Correct Patient, Correct Site, Correct Laterality,  Correct Procedure, Correct Position, site marked,  Risks and benefits discussed,  Surgical consent,  Pre-op evaluation,  At surgeon's request and post-op pain management ? ?Laterality: Left ? ?Prep: chloraprep     ?  ?Needles:  ?Injection technique: Single-shot ? ?Needle Type: Echogenic Needle   ? ? ?Needle Length: 9cm  ?Needle Gauge: 21  ? ? ? ?Additional Needles: ? ? ?Procedures:,,,, ultrasound used (permanent image in chart),,    ?Narrative:  ?Start time: 07/21/2021 6:54 AM ?End time: 07/21/2021 6:59 AM ?Injection made incrementally with aspirations every 5 mL. ? ?Performed by: Personally  ?Anesthesiologist: Suzette Battiest, MD ? ? ? ? ?

## 2021-07-21 NOTE — Brief Op Note (Signed)
07/21/2021 ? ?8:38 AM ? ?PATIENT:  Allison [name]  76 y.o. female ? ?PRE-OPERATIVE DIAGNOSIS:  LEFT KNEE OSTEOARTHRITIS / DEGENERATIVE JOINT DISEASE ? ?POST-OPERATIVE DIAGNOSIS:  LEFT KNEE OSTEOARTHRITIS / DEGENERATIVE JOINT  ? ?PROCEDURE:  Procedure(s): ?LEFT TOTAL KNEE ARTHROPLASTY (Left) ? ?SURGEON:  Surgeon(s) and Role: ?   [name], MD - Primary ? ?PHYSICIAN ASSISTANT:  Benita Stabile, PA-C ? ?ANESTHESIA:   local, regional, and general ? ?EBL:  50 mL  ? ?COUNTS:  YES ? ?TOURNIQUET:   ?Total Tourniquet Time Documented: ?Thigh (Left) - 45 minutes ?Total: Thigh (Left) - 45 minutes ? ? ?DICTATION: .Other Dictation: Dictation Number [id number] ? ?PLAN OF CARE: Admit for overnight observation ? ?PATIENT DISPOSITION:  PACU - hemodynamically stable. ?  ?Delay start of Pharmacological VTE agent (>24hrs) due to surgical blood loss or risk of bleeding: no ? ?

## 2021-07-21 NOTE — Anesthesia Postprocedure Evaluation (Signed)
Anesthesia Post Note ? ?Patient: Allison [name] ? ?Procedure(s) Performed: LEFT TOTAL KNEE ARTHROPLASTY (Left: Knee) ? ?  ? ?Patient location during evaluation: PACU ?Anesthesia Type: General ?Level of consciousness: awake and alert ?Pain management: pain level controlled ?Vital Signs Assessment: post-procedure vital signs reviewed and stable ?Respiratory status: spontaneous breathing, nonlabored ventilation, respiratory function stable and patient connected to nasal cannula oxygen ?Cardiovascular status: blood pressure returned to baseline and stable ?Postop Assessment: no apparent nausea or vomiting ?Anesthetic complications: no ? ? ?No notable events documented. ? ?Last Vitals:  ?Vitals:  ? 07/21/21 1033 07/21/21 1223  ?BP: (!) 158/68 (!) 159/83  ?Pulse: 62 60  ?Resp: 16 12  ?Temp: 36.6 ?C 36.4 ?C  ?SpO2: 100% 99%  ?  ?Last Pain:  ?Vitals:  ? 07/21/21 1223  ?TempSrc: Oral  ?PainSc:   ? ? ?  ?  ?  ?  ?  ?  ? ?Suzette Battiest E ? ? ? ? ?

## 2021-07-21 NOTE — Interval H&P Note (Signed)
History and Physical Interval Note: The patient understands that she is here today for a left total knee replacement to treat her left hip osteoarthritis.  There has been no acute or interval change in her medical status.  Please see H&P.  The risks and benefits of surgery been explained in detail and informed consent is obtained.  The left operative date has been marked. ? ?07/21/2021 ?7:07 AM ? ?Allison [name]  has presented today for surgery, with the diagnosis of LEFT KNEE OSTEOARTHRITIS / [place].  The various methods of treatment have been discussed with the patient and family. After consideration of risks, benefits and other options for treatment, the patient has consented to  Procedure(s): ?LEFT TOTAL KNEE ARTHROPLASTY (Left) as a surgical intervention.  The patient's history has been reviewed, patient examined, no change in status, stable for surgery.  I have reviewed the patient's chart and labs.  Questions were answered to the patient's satisfaction.   ? ? ?[name] ? ? ?

## 2021-07-21 NOTE — Evaluation (Signed)
Physical Therapy Evaluation ?Patient Details ?Name: Allison [name] ?MRN: [id number] ?DOB: [birth year] ?Today's Date: 07/21/2021 ? ?History of Present Illness ? Pt s/p L TKR and with hx of R THR, bil RCR, and DJD  ?Clinical Impression ? Pt s/p L TKR and presents with decreased L LE strength/ROM and post op pain limiting functional mobility.  Pt would benefit from short term SNF level rehab to maximize IND and safety prior to returning home ALONE. ?   ? ?Recommendations for follow up therapy are one component of a multi-disciplinary discharge planning process, led by the attending physician.  Recommendations may be updated based on patient status, additional functional criteria and insurance authorization. ? ?Follow Up Recommendations Skilled nursing-short term rehab (<3 hours/day) ? ?  ?Assistance Recommended at Discharge Frequent or constant Supervision/Assistance  ?Patient can return home with the following ? A lot of help with walking and/or transfers;A little help with bathing/dressing/bathroom;Assist for transportation;Help with stairs or ramp for entrance;Assistance with cooking/housework ? ?  ?Equipment Recommendations None recommended by PT  ?Recommendations for Other Services ?    ?  ?Functional Status Assessment Patient has had a recent decline in their functional status and demonstrates the ability to make significant improvements in function in a reasonable and predictable amount of time.  ? ?  ?Precautions / Restrictions Precautions ?Precautions: Knee ?Required Braces or Orthoses: Knee Immobilizer - Left ?Knee Immobilizer - Left: Discontinue once straight leg raise with < 10 degree lag ?Restrictions ?Weight Bearing Restrictions: No ?Other Position/Activity Restrictions: WBAT  ? ?  ? ?Mobility ? Bed Mobility ?Overal bed mobility: Needs Assistance ?Bed Mobility: Supine to Sit, Sit to Supine ?  ?  ?Supine to sit: Min assist, Mod assist ?Sit to supine: Min assist, Mod assist ?  ?General bed mobility  comments: cues for sequence and use of UEs to self assist. ?  ? ?Transfers ?Overall transfer level: Needs assistance ?Equipment used: Rolling walker (2 wheels) ?Transfers: Sit to/from Stand, Bed to chair/wheelchair/BSC ?Sit to Stand: Min assist, Mod assist, From elevated surface ?  ?Step pivot transfers: Min assist, Mod assist ?  ?  ?  ?General transfer comment: cues for LE manqagement and use of UEs to self assist.  Physical assist to bring wt up and fwd and to balance in standing.  Step-Pvt transfer bed to [facility] ?  ? ?Ambulation/Gait ?Ambulation/Gait assistance: Min assist, Mod assist, +2 safety/equipment ?Gait Distance (Feet): 14 Feet ?Assistive device: Rolling walker (2 wheels) ?Gait Pattern/deviations: Step-to pattern, Decreased step length - right, Decreased step length - left, Shuffle, Trunk flexed ?Gait velocity: decr ?  ?  ?General Gait Details: cues for sequence, posture and position from RW ? ?Stairs ?  ?  ?  ?  ?  ? ?Wheelchair Mobility ?  ? ?Modified Rankin (Stroke Patients Only) ?  ? ?  ? ?Balance Overall balance assessment: Needs assistance ?Sitting-balance support: No upper extremity supported, Feet supported ?Sitting balance-Leahy Scale: Fair ?  ?  ?Standing balance support: Bilateral upper extremity supported ?Standing balance-Leahy Scale: Poor ?  ?  ?  ?  ?  ?  ?  ?  ?  ?  ?  ?  ?   ? ? ? ?Pertinent Vitals/Pain Pain Assessment ?Pain Assessment: 0-10 ?Pain Score: 6  ?Pain Location: L knee ?Pain Descriptors / Indicators: Aching, Sore ?Pain Intervention(s): Limited activity within patient's tolerance, Monitored during session, Premedicated before session, Patient requesting pain meds-RN notified  ? ? ?Home Living Family/patient expects to be discharged to:: Skilled nursing  facility ?Living Arrangements: Alone ?  ?  ?  ?  ?  ?  ?  ?  ?   ?  ?Prior Function Prior Level of Function : Independent/Modified Independent ?  ?  ?  ?  ?  ?  ?  ?  ?  ? ? ?Hand Dominance  ?   ? ?  ?Extremity/Trunk Assessment  ?  Upper Extremity Assessment ?Upper Extremity Assessment: Overall WFL for tasks assessed ?  ? ?Lower Extremity Assessment ?Lower Extremity Assessment: LLE deficits/detail ?  ? ?Cervical / Trunk Assessment ?Cervical / Trunk Assessment: Normal  ?Communication  ? Communication: No difficulties  ?Cognition Arousal/Alertness: Awake/alert ?Behavior During Therapy: [facility] for tasks assessed/performed ?Overall Cognitive Status: Within Functional Limits for tasks assessed ?  ?  ?  ?  ?  ?  ?  ?  ?  ?  ?  ?  ?  ?  ?  ?  ?  ?  ?  ? ?  ?General Comments   ? ?  ?Exercises Total Joint Exercises ?Ankle Circles/Pumps: AROM, Both, 15 reps, Supine  ? ?Assessment/Plan  ?  ?PT Assessment Patient needs continued PT services  ?PT Problem List Decreased strength;Decreased range of motion;Decreased activity tolerance;Decreased balance;Decreased mobility;Decreased knowledge of use of DME;Pain ? ?   ?  ?PT Treatment Interventions DME instruction;Gait training;Stair training;Functional mobility training;Therapeutic activities;Therapeutic exercise;Patient/family education   ? ?PT Goals (Current goals can be found in the Care Plan section)  ?Acute Rehab PT Goals ?Patient Stated Goal: Regain IND ?PT Goal Formulation: With patient ?Time For Goal Achievement: 07/28/21 ?Potential to Achieve Goals: Good ? ?  ?Frequency 7X/week ?  ? ? ?Co-evaluation   ?  ?  ?  ?  ? ? ?  ?AM-PAC PT "6 Clicks" Mobility  ?Outcome Measure Help needed turning from your back to your side while in a flat bed without using bedrails?: A Little ?Help needed moving from lying on your back to sitting on the side of a flat bed without using bedrails?: A Lot ?Help needed moving to and from a bed to a chair (including a wheelchair)?: A Lot ?Help needed standing up from a chair using your arms (e.g., wheelchair or bedside chair)?: A Lot ?Help needed to walk in hospital room?: Total ?Help needed climbing 3-5 steps with a railing? : Total ?6 Click Score: 11 ? ?  ?End of Session Equipment  Utilized During Treatment: Gait belt;Left knee immobilizer ?Activity Tolerance: Patient tolerated treatment well;Other (comment) (limited by nausea) ?Patient left: in bed;with call bell/phone within reach;with bed alarm set;with family/visitor present ?Nurse Communication: Mobility status ?PT Visit Diagnosis: Unsteadiness on feet (R26.81);Difficulty in walking, not elsewhere classified (R26.2);Pain ?Pain - Right/Left: Left ?Pain - part of body: Knee ?  ? ?Time: [id number] ?PT Time Calculation (min) (ACUTE ONLY): 24 min ? ? ?Charges:   PT Evaluation ?$PT Eval Low Complexity: 1 Low ?PT Treatments ?$Gait Training: 8-22 mins ?  ?   ? ? ?Debe Coder PT ?Acute Rehabilitation Services ?Pager [phone] ?Office [phone] ? ? ?Conna Terada ?07/21/2021, 5:23 PM ? ?

## 2021-07-21 NOTE — Transfer of Care (Signed)
Immediate Anesthesia Transfer of Care Note ? ?Patient: Allison [name] ? ?Procedure(s) Performed: LEFT TOTAL KNEE ARTHROPLASTY (Left: Knee) ? ?Patient Location: PACU ? ?Anesthesia Type:General ? ?Level of Consciousness: awake and alert  ? ?Airway & Oxygen Therapy: Patient Spontanous Breathing and Patient connected to nasal cannula oxygen ? ?Post-op Assessment: Report given to RN and Post -op Vital signs reviewed and stable ? ?Post vital signs: Reviewed and stable ? ?Last Vitals:  ?Vitals Value Taken Time  ?BP 161/80 07/21/21 0903  ?Temp 36.6 ?C 07/21/21 0903  ?Pulse 71 07/21/21 0904  ?Resp 19 07/21/21 0904  ?SpO2 100 % 07/21/21 0904  ?Vitals shown include unvalidated device data. ? ?Last Pain:  ?Vitals:  ? 07/21/21 0619  ?TempSrc: Oral  ?PainSc:   ?   ? ?Patients Stated Pain Goal: 3 (07/21/21 0539) ? ?Complications: No notable events documented. ?

## 2021-07-21 NOTE — Anesthesia Procedure Notes (Signed)
Procedure Name: LMA Insertion ?Date/Time: 07/21/2021 7:21 AM ?Performed by: Lieutenant Diego, CRNA ?Pre-anesthesia Checklist: Patient identified, Emergency Drugs available, Suction available and Patient being monitored ?Patient Re-evaluated:Patient Re-evaluated prior to induction ?Oxygen Delivery Method: Circle system utilized ?Preoxygenation: Pre-oxygenation with 100% oxygen ?Induction Type: IV induction ?Ventilation: Mask ventilation without difficulty ?LMA: LMA inserted ?LMA Size: 4.0 ?Number of attempts: 1 ?Placement Confirmation: positive ETCO2 and breath sounds checked- equal and bilateral ?Tube secured with: Tape ?Dental Injury: Teeth and Oropharynx as per pre-operative assessment  ? ? ? ? ?

## 2021-07-22 DIAGNOSIS — E785 Hyperlipidemia, unspecified: Secondary | ICD-10-CM | POA: Diagnosis present

## 2021-07-22 DIAGNOSIS — Z96641 Presence of right artificial hip joint: Secondary | ICD-10-CM | POA: Diagnosis present

## 2021-07-22 DIAGNOSIS — Z6830 Body mass index (BMI) 30.0-30.9, adult: Secondary | ICD-10-CM | POA: Diagnosis not present

## 2021-07-22 DIAGNOSIS — R7303 Prediabetes: Secondary | ICD-10-CM | POA: Diagnosis present

## 2021-07-22 DIAGNOSIS — K760 Fatty (change of) liver, not elsewhere classified: Secondary | ICD-10-CM | POA: Diagnosis present

## 2021-07-22 DIAGNOSIS — K219 Gastro-esophageal reflux disease without esophagitis: Secondary | ICD-10-CM | POA: Diagnosis present

## 2021-07-22 DIAGNOSIS — J45909 Unspecified asthma, uncomplicated: Secondary | ICD-10-CM | POA: Diagnosis present

## 2021-07-22 DIAGNOSIS — Z87891 Personal history of nicotine dependence: Secondary | ICD-10-CM | POA: Diagnosis not present

## 2021-07-22 DIAGNOSIS — Z96652 Presence of left artificial knee joint: Secondary | ICD-10-CM

## 2021-07-22 DIAGNOSIS — D62 Acute posthemorrhagic anemia: Secondary | ICD-10-CM | POA: Diagnosis not present

## 2021-07-22 DIAGNOSIS — Z801 Family history of malignant neoplasm of trachea, bronchus and lung: Secondary | ICD-10-CM | POA: Diagnosis not present

## 2021-07-22 DIAGNOSIS — M8580 Other specified disorders of bone density and structure, unspecified site: Secondary | ICD-10-CM | POA: Diagnosis present

## 2021-07-22 DIAGNOSIS — M1712 Unilateral primary osteoarthritis, left knee: Secondary | ICD-10-CM | POA: Diagnosis present

## 2021-07-22 DIAGNOSIS — Z8249 Family history of ischemic heart disease and other diseases of the circulatory system: Secondary | ICD-10-CM | POA: Diagnosis not present

## 2021-07-22 DIAGNOSIS — Z8041 Family history of malignant neoplasm of ovary: Secondary | ICD-10-CM | POA: Diagnosis not present

## 2021-07-22 DIAGNOSIS — Z79899 Other long term (current) drug therapy: Secondary | ICD-10-CM | POA: Diagnosis not present

## 2021-07-22 DIAGNOSIS — I10 Essential (primary) hypertension: Secondary | ICD-10-CM | POA: Diagnosis present

## 2021-07-22 DIAGNOSIS — Z800 Family history of malignant neoplasm of digestive organs: Secondary | ICD-10-CM | POA: Diagnosis not present

## 2021-07-22 LAB — CBC
HCT: 30.9 % — ABNORMAL LOW (ref 36.0–46.0)
Hemoglobin: 9.9 g/dL — ABNORMAL LOW (ref 12.0–15.0)
MCH: 31.8 pg (ref 26.0–34.0)
MCHC: 32 g/dL (ref 30.0–36.0)
MCV: 99.4 fL (ref 80.0–100.0)
Platelets: 214 10*3/uL (ref 150–400)
RBC: 3.11 MIL/uL — ABNORMAL LOW (ref 3.87–5.11)
RDW: 12.6 % (ref 11.5–15.5)
WBC: 8.6 10*3/uL (ref 4.0–10.5)
nRBC: 0 % (ref 0.0–0.2)

## 2021-07-22 LAB — BASIC METABOLIC PANEL
Anion gap: 4 — ABNORMAL LOW (ref 5–15)
BUN: 12 mg/dL (ref 8–23)
CO2: 25 mmol/L (ref 22–32)
Calcium: 8.6 mg/dL — ABNORMAL LOW (ref 8.9–10.3)
Chloride: 109 mmol/L (ref 98–111)
Creatinine, Ser: 0.67 mg/dL (ref 0.44–1.00)
GFR, Estimated: >60 mL/min (ref >60)
Glucose, Bld: 147 mg/dL — ABNORMAL HIGH (ref 70–99)
Potassium: 3.5 mmol/L (ref 3.5–5.1)
Sodium: 138 mmol/L (ref 135–145)

## 2021-07-22 LAB — GLUCOSE, CAPILLARY
Glucose-Capillary: 110 mg/dL — ABNORMAL HIGH (ref 70–99)
Glucose-Capillary: 132 mg/dL — ABNORMAL HIGH (ref 70–99)
Glucose-Capillary: 140 mg/dL — ABNORMAL HIGH (ref 70–99)
Glucose-Capillary: 157 mg/dL — ABNORMAL HIGH (ref 70–99)

## 2021-07-22 NOTE — Plan of Care (Signed)
?  Problem: Education: ?Goal: Knowledge of General Education information will improve ?Description: Including pain rating scale, medication(s)/side effects and non-pharmacologic comfort measures ?Outcome: Progressing ?  ?Problem: Nutrition: ?Goal: Adequate nutrition will be maintained ?Outcome: Adequate for Discharge ?  ?Problem: Coping: ?Goal: Level of anxiety will decrease ?Outcome: Progressing ?  ?Problem: Elimination: ?Goal: Will not experience complications related to urinary retention ?Outcome: Progressing ?  ?Problem: Pain Managment: ?Goal: General experience of comfort will improve ?Outcome: Progressing ?  ?

## 2021-07-22 NOTE — TOC Progression Note (Addendum)
Transition of Care (TOC) - Progression Note  ? ? ?Patient Details  ?Name: BRANDA CHAUDHARY ?MRN: [id number] ?Date of Birth: 09/19/1945 ? ?Transition of Care (TOC) CM/SW Contact  ?Ross Ludwig, LCSW ?Phone Number: ?07/22/2021, 4:11 PM ? ?Clinical Narrative:    ? ?CSW received phone call back from [facility].  They can accept patient on Monday, CSW to begin insurance authorization, pending reference number is [id number].  CSW faxed clinicals to [facility] ? ?Expected Discharge Plan: [place] ?Barriers to Discharge: Ship broker, Continued Medical Work up ? ?Expected Discharge Plan and Services ?Expected Discharge Plan: [place] ?  ?  ?Post Acute Care Choice: [place] ?Living arrangements for the past 2 months: [place] ?                ?  ?  ?  ?  ?  ?  ?  ?  ?  ?  ? ? ?Social Determinants of Health (SDOH) Interventions ?  ? ?Readmission Risk Interventions ?   ? View : No data to display.  ?  ?  ?  ? ? ?

## 2021-07-22 NOTE — Plan of Care (Signed)
  Problem: Education: Goal: Knowledge of General Education information will improve Description Including pain rating scale, medication(s)/side effects and non-pharmacologic comfort measures Outcome: Progressing   

## 2021-07-22 NOTE — Progress Notes (Signed)
Physical Therapy Treatment ?Patient Details ?Name: Allison [name] ?MRN: [id number] ?DOB: 05/07/1945 ?Today's Date: 07/22/2021 ? ? ?History of Present Illness Pt s/p L TKR and with hx of R THR, bil RCR, and DJD ? ?  ?PT Comments  ? ? Pt continues very cooperative and progressing steadily with mobility.   ?Recommendations for follow up therapy are one component of a multi-disciplinary discharge planning process, led by the attending physician.  Recommendations may be updated based on patient status, additional functional criteria and insurance authorization. ? ?Follow Up Recommendations ? Skilled nursing-short term rehab (<3 hours/day) ?  ?  ?Assistance Recommended at Discharge Frequent or constant Supervision/Assistance  ?Patient can return home with the following A lot of help with walking and/or transfers;A little help with bathing/dressing/bathroom;Assist for transportation;Help with stairs or ramp for entrance;Assistance with cooking/housework ?  ?Equipment Recommendations ? None recommended by PT  ?  ?Recommendations for Other Services   ? ? ?  ?Precautions / Restrictions Precautions ?Precautions: Knee ?Required Braces or Orthoses: Knee Immobilizer - Left ?Knee Immobilizer - Left: Discontinue once straight leg raise with < 10 degree lag ?Restrictions ?Weight Bearing Restrictions: No ?Other Position/Activity Restrictions: WBAT  ?  ? ?Mobility ? Bed Mobility ?Overal bed mobility: Needs Assistance ?Bed Mobility: Supine to Sit, Sit to Supine ?  ?  ?Supine to sit: Min assist ?Sit to supine: Min assist, Mod assist ?  ?General bed mobility comments: cues for sequence and use of UEs to self assist. ?  ? ?Transfers ?Overall transfer level: Needs assistance ?Equipment used: Rolling walker (2 wheels) ?Transfers: Sit to/from Stand ?Sit to Stand: Min assist, Mod assist, From elevated surface ?  ?  ?  ?  ?  ?General transfer comment: cues for LE management and use of UEs to self assist. ?  ? ?Ambulation/Gait ?Ambulation/Gait  assistance: Min assist ?Gait Distance (Feet): 38 Feet (38' twice with rest break between) ?Assistive device: Rolling walker (2 wheels) ?Gait Pattern/deviations: Step-to pattern, Decreased step length - right, Decreased step length - left, Shuffle, Trunk flexed ?Gait velocity: decr ?  ?  ?General Gait Details: Increased time with cues for sequence, posture and position from RW ? ? ?Stairs ?  ?  ?  ?  ?  ? ? ?Wheelchair Mobility ?  ? ?Modified Rankin (Stroke Patients Only) ?  ? ? ?  ?Balance Overall balance assessment: Needs assistance ?Sitting-balance support: No upper extremity supported, Feet supported ?Sitting balance-Leahy Scale: Good ?  ?  ?Standing balance support: Bilateral upper extremity supported ?Standing balance-Leahy Scale: Poor ?  ?  ?  ?  ?  ?  ?  ?  ?  ?  ?  ?  ?  ? ?  ?Cognition Arousal/Alertness: Awake/alert ?Behavior During Therapy: [facility] for tasks assessed/performed ?Overall Cognitive Status: Within Functional Limits for tasks assessed ?  ?  ?  ?  ?  ?  ?  ?  ?  ?  ?  ?  ?  ?  ?  ?  ?  ?  ?  ? ?  ?Exercises Total Joint Exercises ?Ankle Circles/Pumps: AROM, Both, 15 reps, Supine ?Quad Sets: AROM, Both, 10 reps, Supine ?Heel Slides: AAROM, Left, Supine, 15 reps ?Straight Leg Raises: AAROM, Left, 10 reps, Supine ? ?  ?General Comments   ?  ?  ? ?Pertinent Vitals/Pain Pain Assessment ?Pain Assessment: 0-10 ?Pain Score: 6  ?Pain Location: L knee ?Pain Descriptors / Indicators: Aching, Sore ?Pain Intervention(s): Limited activity within patient's tolerance, Monitored during session,  Premedicated before session, Ice applied  ? ? ?Home Living Family/patient expects to be discharged to:: Skilled nursing facility ?Living Arrangements: Alone ?  ?  ?  ?  ?  ?  ?  ?  ?   ?  ?Prior Function    ?  ?  ?   ? ?PT Goals (current goals can now be found in the care plan section) Acute Rehab PT Goals ?Patient Stated Goal: Regain IND ?PT Goal Formulation: With patient ?Time For Goal Achievement: 07/28/21 ?Potential to  Achieve Goals: Good ?Progress towards PT goals: Progressing toward goals ? ?  ?Frequency ? ? ? 7X/week ? ? ? ?  ?PT Plan Current plan remains appropriate  ? ? ?Co-evaluation   ?  ?  ?  ?  ? ?  ?AM-PAC PT "6 Clicks" Mobility   ?Outcome Measure ? Help needed turning from your back to your side while in a flat bed without using bedrails?: A Little ?Help needed moving from lying on your back to sitting on the side of a flat bed without using bedrails?: A Lot ?Help needed moving to and from a bed to a chair (including a wheelchair)?: A Lot ?Help needed standing up from a chair using your arms (e.g., wheelchair or bedside chair)?: A Lot ?Help needed to walk in hospital room?: A Little ?Help needed climbing 3-5 steps with a railing? : Total ?6 Click Score: 13 ? ?  ?End of Session Equipment Utilized During Treatment: Gait belt;Left knee immobilizer ?Activity Tolerance: Patient tolerated treatment well;Patient limited by fatigue ?Patient left: with call bell/phone within reach;Other (comment) ?Nurse Communication: Mobility status ?PT Visit Diagnosis: Unsteadiness on feet (R26.81);Difficulty in walking, not elsewhere classified (R26.2);Pain ?Pain - Right/Left: Left ?Pain - part of body: Knee ?  ? ? ?Time: 3888-2800 ?PT Time Calculation (min) (ACUTE ONLY): 28 min ? ?Charges:  $Gait Training: 23-37 mins ?$Therapeutic Exercise: 8-22 mins          ?          ? ?Debe Coder PT ?Acute Rehabilitation Services ?Pager [phone] ?Office [phone] ? ? ? ?Alston Berrie ?07/22/2021, 4:00 PM ? ?

## 2021-07-22 NOTE — Progress Notes (Signed)
Physical Therapy Treatment ?Patient Details ?Name: Allison [name] ?MRN: [id number] ?DOB: 05/01/1945 ?Today's Date: 07/22/2021 ? ? ?History of Present Illness Pt s/p L TKR and with hx of R THR, bil RCR, and DJD ? ?  ?PT Comments  ? ? Pt very cooperative and notable progress with mobility including increased distance ambulated and decreased assist needed for most tasks.  ?Recommendations for follow up therapy are one component of a multi-disciplinary discharge planning process, led by the attending physician.  Recommendations may be updated based on patient status, additional functional criteria and insurance authorization. ? ?Follow Up Recommendations ? Skilled nursing-short term rehab (<3 hours/day) ?  ?  ?Assistance Recommended at Discharge Frequent or constant Supervision/Assistance  ?Patient can return home with the following A lot of help with walking and/or transfers;A little help with bathing/dressing/bathroom;Assist for transportation;Help with stairs or ramp for entrance;Assistance with cooking/housework ?  ?Equipment Recommendations ? None recommended by PT  ?  ?Recommendations for Other Services   ? ? ?  ?Precautions / Restrictions Precautions ?Precautions: Knee ?Required Braces or Orthoses: Knee Immobilizer - Left ?Knee Immobilizer - Left: Discontinue once straight leg raise with < 10 degree lag ?Restrictions ?Weight Bearing Restrictions: No ?Other Position/Activity Restrictions: WBAT  ?  ? ?Mobility ? Bed Mobility ?Overal bed mobility: Needs Assistance ?Bed Mobility: Supine to Sit ?  ?  ?Supine to sit: Min assist ?  ?  ?General bed mobility comments: cues for sequence and use of UEs to self assist. ?  ? ?Transfers ?Overall transfer level: Needs assistance ?Equipment used: Rolling walker (2 wheels) ?Transfers: Sit to/from Stand ?Sit to Stand: Min assist, Mod assist, From elevated surface ?  ?  ?  ?  ?  ?General transfer comment: cues for LE management and use of UEs to self assist. ?   ? ?Ambulation/Gait ?Ambulation/Gait assistance: Min assist, Mod assist ?Gait Distance (Feet): 43 Feet (and additional 15' into bathroom) ?Assistive device: Rolling walker (2 wheels) ?Gait Pattern/deviations: Step-to pattern, Decreased step length - right, Decreased step length - left, Shuffle, Trunk flexed ?Gait velocity: decr ?  ?  ?General Gait Details: cues for sequence, posture and position from RW ? ? ?Stairs ?  ?  ?  ?  ?  ? ? ?Wheelchair Mobility ?  ? ?Modified Rankin (Stroke Patients Only) ?  ? ? ?  ?Balance Overall balance assessment: Needs assistance ?Sitting-balance support: No upper extremity supported, Feet supported ?Sitting balance-Leahy Scale: Fair ?  ?  ?Standing balance support: Bilateral upper extremity supported ?Standing balance-Leahy Scale: Poor ?  ?  ?  ?  ?  ?  ?  ?  ?  ?  ?  ?  ?  ? ?  ?Cognition Arousal/Alertness: Awake/alert ?Behavior During Therapy: [facility] for tasks assessed/performed ?Overall Cognitive Status: Within Functional Limits for tasks assessed ?  ?  ?  ?  ?  ?  ?  ?  ?  ?  ?  ?  ?  ?  ?  ?  ?  ?  ?  ? ?  ?Exercises Total Joint Exercises ?Ankle Circles/Pumps: AROM, Both, 15 reps, Supine ?Quad Sets: AROM, Both, 10 reps, Supine ?Heel Slides: AAROM, Left, Supine, 15 reps ?Straight Leg Raises: AAROM, Left, 10 reps, Supine ? ?  ?General Comments   ?  ?  ? ?Pertinent Vitals/Pain Pain Assessment ?Pain Assessment: 0-10 ?Pain Score: 6  ?Pain Location: L knee ?Pain Descriptors / Indicators: Aching, Sore ?Pain Intervention(s): Limited activity within patient's tolerance, Monitored during session, Premedicated  before session, Ice applied  ? ? ?Home Living   ?  ?  ?  ?  ?  ?  ?  ?  ?  ?   ?  ?Prior Function    ?  ?  ?   ? ?PT Goals (current goals can now be found in the care plan section) Acute Rehab PT Goals ?Patient Stated Goal: Regain IND ?PT Goal Formulation: With patient ?Time For Goal Achievement: 07/28/21 ?Potential to Achieve Goals: Good ?Progress towards PT goals: Progressing toward  goals ? ?  ?Frequency ? ? ? 7X/week ? ? ? ?  ?PT Plan Current plan remains appropriate  ? ? ?Co-evaluation   ?  ?  ?  ?  ? ?  ?AM-PAC PT "6 Clicks" Mobility   ?Outcome Measure ? Help needed turning from your back to your side while in a flat bed without using bedrails?: A Little ?Help needed moving from lying on your back to sitting on the side of a flat bed without using bedrails?: A Lot ?Help needed moving to and from a bed to a chair (including a wheelchair)?: A Lot ?Help needed standing up from a chair using your arms (e.g., wheelchair or bedside chair)?: A Lot ?Help needed to walk in hospital room?: A Lot ?Help needed climbing 3-5 steps with a railing? : Total ?6 Click Score: 12 ? ?  ?End of Session Equipment Utilized During Treatment: Gait belt;Left knee immobilizer ?Activity Tolerance: Patient tolerated treatment well;Patient limited by fatigue ?Patient left: with call bell/phone within reach;Other (comment) (bathroom) ?Nurse Communication: Mobility status ?PT Visit Diagnosis: Unsteadiness on feet (R26.81);Difficulty in walking, not elsewhere classified (R26.2);Pain ?Pain - Right/Left: Left ?Pain - part of body: Knee ?  ? ? ?Time: 5093-2671 ?PT Time Calculation (min) (ACUTE ONLY): 34 min ? ?Charges:  $Gait Training: 8-22 mins ?$Therapeutic Exercise: 8-22 mins          ?          ? ?Debe Coder PT ?Acute Rehabilitation Services ?Pager [phone] ?Office [phone] ? ? ? ?Jada Kuhnert ?07/22/2021, 2:25 PM ? ?

## 2021-07-22 NOTE — TOC Initial Note (Signed)
Transition of Care (TOC) - Initial/Assessment Note  ? ? ?Patient Details  ?Name: TARREN [name] ?MRN: [id number] ?Date of Birth: March 10, 1946 ? ?Transition of Care (TOC) CM/SW Contact:    ?Ross Ludwig, LCSW ?Phone Number: ?07/22/2021, 11:15 AM ? ?Clinical Narrative:                 ? ?CSW received consult that patient would like SNF for rehab.  CSW spoke to patient, and she would prefer [facility] if possible.  CSW attempted to reach [facility], had to leave a message on voice mail.  Patient has been faxed out and worked up for SNF placement.  CSW awaiting bed offers, CSW to continue to follow patient's progress throughout discharge planning.  ? ?Expected Discharge Plan: [place] ?Barriers to Discharge: Ship broker, Continued Medical Work up ? ? ?Patient Goals and CMS Choice ?Patient states their goals for this hospitalization and ongoing recovery are:: To go to SNF for rehab, then return back home. ?CMS Medicare.gov Compare Post Acute Care list provided to:: Patient ?Choice offered to / list presented to : Patient ? ?Expected Discharge Plan and Services ?Expected Discharge Plan: [place] ?  ?  ?Post Acute Care Choice: [place] ?Living arrangements for the past 2 months: Allison [name] ?                ?  ?  ?  ?  ?  ?  ?  ?  ?  ?  ? ?Prior Living Arrangements/Services ?Living arrangements for the past 2 months: [place] ?Lives with:: Self ?Patient language and need for interpreter reviewed:: Yes ?Do you feel safe going back to the place where you live?: No   Patient feels she needs SNF for rehab, before returning back home.  ?Need for Family Participation in Patient Care: No (Comment) ?Care giver support system in place?: No (comment) ?  ?Criminal Activity/Legal Involvement Pertinent to Current Situation/Hospitalization: No - Comment as needed ? ?Activities of Daily Living ?Home Assistive Devices/Equipment: Cane (specify quad or  straight), Walker (specify type) ?ADL Screening (condition at time of admission) ?Patient's cognitive ability adequate to safely complete daily activities?: Yes ?Is the patient deaf or have difficulty hearing?: No ?Does the patient have difficulty seeing, even when wearing glasses/contacts?: No ?Does the patient have difficulty concentrating, remembering, or making decisions?: No ?Patient able to express need for assistance with ADLs?: Yes ?Does the patient have difficulty dressing or bathing?: No ?Independently performs ADLs?: No ?Communication: Independent ?Dressing (OT): Needs assistance ?Is this a change from baseline?: Change from baseline, expected to last >3 days ?Grooming: Independent ?Feeding: Independent ?Bathing: Needs assistance ?Is this a change from baseline?: Change from baseline, expected to last <3 days ?In/Out Bed: Needs assistance ?Is this a change from baseline?: Change from baseline, expected to last <3 days ?Walks in Home: Needs assistance ?Is this a change from baseline?: Change from baseline, expected to last <3 days ?Does the patient have difficulty walking or climbing stairs?: Yes ?Weakness of Legs: Both ?Weakness of Arms/Hands: None ? ?Permission Sought/Granted ?Permission sought to share information with : Case Manager, Family Supports, Customer service manager ?Permission granted to share information with : Yes, Release of Information Signed, Yes, Verbal Permission Granted ? Share Information with NAME: Desiree Lucy [phone]  [phone]  [place] Other [phone]  [phone] ? Permission granted to share info w AGENCY: SNF admissions ?   ?   ? ?Emotional Assessment ?Appearance:: Appears stated age ?Attitude/Demeanor/Rapport:  Engaged ?Affect (typically observed): Accepting, Appropriate, Calm, Hopeful, Pleasant, Stable ?Orientation: : Oriented to Self, Oriented to Place, Oriented to Situation, Oriented to  Time ?Alcohol / Substance Use: Not Applicable ?Psych  Involvement: No (comment) ? ?Admission diagnosis:  Status post left knee replacement [Z96.652] ?Patient Active Problem List  ? Diagnosis Date Noted  ? Status post left knee replacement 07/21/2021  ? Unilateral primary osteoarthritis, left knee 02/08/2021  ? Unilateral primary osteoarthritis, left hip 01/11/2021  ? Low back pain 01/11/2021  ? Pain in right shoulder 05/12/2020  ? Pain in right hip 02/10/2020  ? Pain in right foot 02/10/2020  ? Bilateral primary osteoarthritis of knee 12/31/2019  ? Syncope and collapse 09/08/2019  ? Chronic pain of right knee 03/28/2016  ? Unilateral primary osteoarthritis, right knee 03/28/2016  ? Chondrocalcinosis of right knee 03/28/2016  ? Acute bronchitis 03/30/2015  ? HTN (hypertension) 03/04/2013  ? Osteopenia 03/04/2013  ? Dyspnea 01/20/2013  ? Bronchospasm, acute 11/22/2011  ? Urticaria 11/22/2011  ? Family history of colon cancer 09/09/2011  ? Obesity 09/09/2011  ? Hypokalemia 06/18/2011  ? Hyperlipidemia   ? Thyroid disease   ? Fatty liver disease, nonalcoholic   ? DJD (degenerative joint disease) of hip   ? ?PCP:  Leeroy Cha, MD ?Pharmacy:   ?[place], [place] - [address] ?[place] ?[place] [address] ?Phone: [phone] Fax: [phone] ? ? ? ? ?Social Determinants of Health (SDOH) Interventions ?  ? ?Readmission Risk Interventions ?   ? View : No data to display.  ?  ?  ?  ? ? ? ?

## 2021-07-22 NOTE — Discharge Instructions (Signed)

## 2021-07-22 NOTE — [place] FL2 (Signed)
?[place] MEDICAID FL2 LEVEL OF CARE SCREENING TOOL  ?  ? ?IDENTIFICATION  ?Patient Name: ?Allison [name] Birthdate: Apr 29, 1945 Sex: female Admission Date (Current Location): ?07/21/2021  ?[place] and [place] Number: ? [place] ?  Facility and Address:  ?[facility],  [address], [place] ?     Provider Number: ?[id number]  ?Attending Physician Name and Address:  ?[name],* ? Relative Name and Phone Number:  ?Desiree Lucy [phone]  [phone]  Thurman Coyer Other [phone]  [phone] ?   ?Current Level of Care: ?Hospital Recommended Level of Care: ?[place] Prior Approval Number: ?  ? ?Date Approved/Denied: ?  PASRR Number: ?[id number] A ? ?Discharge Plan: ?SNF ?  ? ?Current Diagnoses: ?Patient Active Problem List  ? Diagnosis Date Noted  ? Status post left knee replacement 07/21/2021  ? Unilateral primary osteoarthritis, left knee 02/08/2021  ? Unilateral primary osteoarthritis, left hip 01/11/2021  ? Low back pain 01/11/2021  ? Pain in right shoulder 05/12/2020  ? Pain in right hip 02/10/2020  ? Pain in right foot 02/10/2020  ? Bilateral primary osteoarthritis of knee 12/31/2019  ? Syncope and collapse 09/08/2019  ? Chronic pain of right knee 03/28/2016  ? Unilateral primary osteoarthritis, right knee 03/28/2016  ? Chondrocalcinosis of right knee 03/28/2016  ? Acute bronchitis 03/30/2015  ? HTN (hypertension) 03/04/2013  ? Osteopenia 03/04/2013  ? Dyspnea 01/20/2013  ? Bronchospasm, acute 11/22/2011  ? Urticaria 11/22/2011  ? Family history of colon cancer 09/09/2011  ? Obesity 09/09/2011  ? Hypokalemia 06/18/2011  ? Hyperlipidemia   ? Thyroid disease   ? Fatty liver disease, nonalcoholic   ? DJD (degenerative joint disease) of hip   ? ? ?Orientation RESPIRATION BLADDER Height & Weight   ?  ?Self, Time, Place, Situation ? Normal Continent Weight: 179 lb 14.3 oz (81.6 kg) ?Height:  '5\' 4"'$  (162.6 cm)  ?BEHAVIORAL SYMPTOMS/MOOD  NEUROLOGICAL BOWEL NUTRITION STATUS  ?    Continent Diet (Carb Modified)  ?AMBULATORY STATUS COMMUNICATION OF NEEDS Skin   ?Limited Assist Verbally Surgical wounds ?  ?  ?  ?    ?     ?     ? ? ?Personal Care Assistance Level of Assistance  ?Bathing, Dressing, Feeding Bathing Assistance: Limited assistance ?Feeding assistance: Independent ?Dressing Assistance: Limited assistance ?   ? ?Functional Limitations Info  ?Sight, Speech, Hearing Sight Info: Adequate ?Hearing Info: Adequate ?Speech Info: Adequate  ? ? ?SPECIAL CARE FACTORS FREQUENCY  ?PT (By licensed PT), OT (By licensed OT)   ?  ?PT Frequency: Minimum 5x a week ?OT Frequency: Minimum 5x a week ?  ?  ?  ?   ? ? ?Contractures Contractures Info: Not present  ? ? ?Additional Factors Info  ?Code Status, Allergies, Insulin Sliding Scale Code Status Info: Full code ?Allergies Info: NKA ?  ?Insulin Sliding Scale Info: insulin aspart (novoLOG) injection 0-15 Units 3x a day with meals ?  ?   ? ?Current Medications (07/22/2021):  This is the current hospital active medication list ?Current Facility-Administered Medications  ?Medication Dose Route Frequency Provider Last Rate Last Admin  ? 0.9 %  sodium chloride infusion   Intravenous Continuous [name], MD   Stopped at 07/22/21 [phone]  ? acetaminophen (TYLENOL) tablet 325-650 mg  325-650 mg Oral Q6H PRN [name], MD   650 mg at 07/22/21 3614  ? albuterol (PROVENTIL) (2.5 MG/3ML) 0.083% nebulizer solution 2.5 mg  2.5 mg Inhalation Q6H PRN [name],  MD      ? alum & mag hydroxide-simeth (MAALOX/MYLANTA) 200-200-20 MG/5ML suspension 30 mL  30 mL Oral Q4H PRN [name], MD      ? aspirin chewable tablet 81 mg  81 mg Oral BID [name], MD   81 mg at 07/22/21 4193  ? atorvastatin (LIPITOR) tablet 10 mg  10 mg Oral Daily [name], MD   10 mg at 07/22/21 7902  ? calcium carbonate (OS-CAL - dosed in mg of elemental calcium) tablet 1,250 mg   1,250 mg Oral BID WC [name], MD   1,250 mg at 07/22/21 0840  ? cholecalciferol (VITAMIN D3) tablet 2,000 Units  2,000 Units Oral Daily [name], MD   2,000 Units at 07/22/21 [phone]  ? docusate sodium (COLACE) capsule 100 mg  100 mg Oral BID [name], MD   100 mg at 07/22/21 3532  ? furosemide (LASIX) tablet 20 mg  20 mg Oral Daily [name], MD   20 mg at 07/22/21 9924  ? HYDROmorphone (DILAUDID) injection 0.5-1 mg  0.5-1 mg Intravenous Q4H PRN [name], MD      ? insulin aspart (novoLOG) injection 0-15 Units  0-15 Units Subcutaneous TID WC [name], MD   3 Units at 07/21/21 1840  ? levothyroxine (SYNTHROID) tablet 100 mcg  100 mcg Oral QAC breakfast [name], MD   100 mcg at 07/22/21 0547  ? menthol-cetylpyridinium (CEPACOL) lozenge 3 mg  1 lozenge Oral PRN [name], MD      ? Or  ? phenol (CHLORASEPTIC) mouth spray 1 spray  1 spray Mouth/Throat PRN [name], MD      ? methocarbamol (ROBAXIN) 500 mg in dextrose 5 % 50 mL IVPB  500 mg Intravenous Q8H PRN Pete Pelt, PA-C      ? methocarbamol (ROBAXIN) tablet 500 mg  500 mg Oral Q8H PRN Pete Pelt, PA-C   500 mg at 07/21/21 1705  ? metoCLOPramide (REGLAN) tablet 5-10 mg  5-10 mg Oral Q8H PRN [name], MD      ? Or  ? metoCLOPramide (REGLAN) injection 5-10 mg  5-10 mg Intravenous Q8H PRN [name], MD   10 mg at 07/21/21 1426  ? minocycline (MINOCIN) capsule 100 mg  100 mg Oral BID [name], MD   100 mg at 07/21/21 2029  ? ondansetron (ZOFRAN) tablet 4 mg  4 mg Oral Q6H PRN [name], MD      ? Or  ? ondansetron [facility]) injection 4 mg  4 mg Intravenous Q6H PRN [name], MD   4 mg at 07/21/21 1705  ? oxyCODONE (Oxy IR/ROXICODONE) immediate release tablet 10-15 mg  10-15 mg Oral Q4H PRN [name], MD   10 mg at 07/22/21 0955  ? oxyCODONE  (Oxy IR/ROXICODONE) immediate release tablet 5-10 mg  5-10 mg Oral Q4H PRN [name], MD   5 mg at 07/22/21 0550  ? pantoprazole (PROTONIX) EC tablet 40 mg  40 mg Oral Daily [name], MD   40 mg at 07/22/21 2683  ? potassium chloride (KLOR-CON M) CR tablet 10 mEq  10 mEq Oral Daily [name], MD   10 mEq at 07/22/21 4196  ? ? ? ?Discharge Medications: ?Please see discharge summary for a list of discharge medications. ? ?Relevant Imaging Results: ? ?Relevant Lab Results: ? ? ?Additional Information ?SSN [id number] ? ?  Ross Ludwig, LCSW ? ? ? ? ?

## 2021-07-22 NOTE — Progress Notes (Signed)
Subjective: ?1 Day Post-Op Procedure(s) (LRB): ?LEFT TOTAL KNEE ARTHROPLASTY (Left) ?Patient reports pain as moderate.  She does have acute blood loss anemia from her surgery but her vital signs are stable and she is tolerating it well.  She did state that she does not have support at home and will need short-term skilled nursing following this hospital stay. ? ?Objective: ?Vital signs in last 24 hours: ?Temp:  [97.6 ?F (36.4 ?C)-98.7 ?F (37.1 ?C)] 97.7 ?F (36.5 ?C) (04/22 3716) ?Pulse Rate:  [56-66] 60 (04/22 0949) ?Resp:  [12-18] 18 (04/22 0949) ?BP: (101-159)/(44-83) 121/44 (04/22 0949) ?SpO2:  [95 %-100 %] 99 % (04/22 0949) ? ?Intake/Output from previous day: ?04/21 0701 - 04/22 0700 ?In: 2152.6 [P.O.:150; I.V.:1752.6; IV Piggyback:250] ?Out: 50 [Blood:50] ?Intake/Output this shift: ?Total I/O ?In: 837.4 [P.O.:240; I.V.:597.4] ?Out: -  ? ?Recent Labs  ?  07/22/21 ?0307  ?HGB 9.9*  ? ?Recent Labs  ?  07/22/21 ?0307  ?WBC 8.6  ?RBC 3.11*  ?HCT 30.9*  ?PLT 214  ? ?Recent Labs  ?  07/22/21 ?0307  ?NA 138  ?K 3.5  ?CL 109  ?CO2 25  ?BUN 12  ?CREATININE 0.67  ?GLUCOSE 147*  ?CALCIUM 8.6*  ? ?No results for input(s): LABPT, INR in the last 72 hours. ? ?Sensation intact distally ?Intact pulses distally ?Dorsiflexion/Plantar flexion intact ?Incision: dressing C/D/I ?No cellulitis present ?Compartment soft ? ? ?Assessment/Plan: ?1 Day Post-Op Procedure(s) (LRB): ?LEFT TOTAL KNEE ARTHROPLASTY (Left) ?Up with therapy ?Discharge to SNF when medically cleared and bed availability has been established. ?The transitional care team is on board and aware of the patient's request for short-term skilled nursing.  They are working on this.  I will need to change the patient now to inpatient admission since she will be here till at least Monday as her disposition is being worked on. ? ? ? ? ? ?[name] ?07/22/2021, 11:23 AM ? ?

## 2021-07-23 ENCOUNTER — Encounter (HOSPITAL_COMMUNITY): Payer: Self-pay | Admitting: Orthopaedic Surgery

## 2021-07-23 LAB — GLUCOSE, CAPILLARY
Glucose-Capillary: 147 mg/dL — ABNORMAL HIGH (ref 70–99)
Glucose-Capillary: 104 mg/dL — ABNORMAL HIGH (ref 70–99)
Glucose-Capillary: 177 mg/dL — ABNORMAL HIGH (ref 70–99)
Glucose-Capillary: 171 mg/dL — ABNORMAL HIGH (ref 70–99)

## 2021-07-23 MED ORDER — POLYETHYLENE GLYCOL 3350 17 G PO PACK
17.0000 g | PACK | Freq: Every day | ORAL | Status: DC
  Administered 2021-07-23 – 2021-07-24 (×2): 17 g via ORAL
  Filled 2021-07-23 (×2): qty 1

## 2021-07-23 MED ORDER — SENNA 8.6 MG PO TABS
1.0000 | ORAL_TABLET | Freq: Every evening | ORAL | Status: DC | PRN
  Administered 2021-07-24: 8.6 mg via ORAL
  Filled 2021-07-23: qty 1

## 2021-07-23 NOTE — Progress Notes (Signed)
Physical Therapy Treatment ?Patient Details ?Name: Allison [name] ?MRN: [id number] ?DOB: [birth year] ?Today's Date: 07/23/2021 ? ? ?History of Present Illness Pt s/p L TKR and with hx of R THR, bil RCR, and DJD ? ?  ?PT Comments  ? ? Pt continues very cooperative and progressing steadily with mobility.  Pt performed therex program, up to ambulate increased distance in hall and up to bathroom for toileting and hygiene standing at sink.  ?Recommendations for follow up therapy are one component of a multi-disciplinary discharge planning process, led by the attending physician.  Recommendations may be updated based on patient status, additional functional criteria and insurance authorization. ? ?Follow Up Recommendations ? Skilled nursing-short term rehab (<3 hours/day) ?  ?  ?Assistance Recommended at Discharge Frequent or constant Supervision/Assistance  ?Patient can return home with the following A little help with bathing/dressing/bathroom;Assist for transportation;Help with stairs or ramp for entrance;Assistance with cooking/housework;A little help with walking and/or transfers ?  ?Equipment Recommendations ? None recommended by PT  ?  ?Recommendations for Other Services   ? ? ?  ?Precautions / Restrictions Precautions ?Precautions: Knee ?Required Braces or Orthoses: Knee Immobilizer - Left ?Knee Immobilizer - Left: Discontinue once straight leg raise with < 10 degree lag ?Restrictions ?Weight Bearing Restrictions: No ?Other Position/Activity Restrictions: WBAT  ?  ? ?Mobility ? Bed Mobility ?Overal bed mobility: Needs Assistance ?Bed Mobility: Supine to Sit ?  ?  ?Supine to sit: Min assist ?  ?  ?General bed mobility comments: cues for sequence and use of UEs to self assist. ?  ? ?Transfers ?Overall transfer level: Needs assistance ?Equipment used: Rolling walker (2 wheels) ?Transfers: Sit to/from Stand ?Sit to Stand: Min assist, From elevated surface ?  ?  ?  ?  ?  ?General transfer comment: cues for LE  management and use of UEs to self assist. ?  ? ?Ambulation/Gait ?Ambulation/Gait assistance: Min assist ?Gait Distance (Feet): 56 Feet (and additional 36' and 15' into bathroom) ?Assistive device: Rolling walker (2 wheels) ?Gait Pattern/deviations: Step-to pattern, Decreased step length - right, Decreased step length - left, Shuffle, Trunk flexed ?Gait velocity: decr ?  ?  ?General Gait Details: Increased time with cues for sequence, posture and position from RW ? ? ?Stairs ?  ?  ?  ?  ?  ? ? ?Wheelchair Mobility ?  ? ?Modified Rankin (Stroke Patients Only) ?  ? ? ?  ?Balance Overall balance assessment: Needs assistance ?Sitting-balance support: No upper extremity supported, Feet supported ?Sitting balance-Leahy Scale: Good ?  ?  ?Standing balance support: Bilateral upper extremity supported ?Standing balance-Leahy Scale: Poor ?  ?  ?  ?  ?  ?  ?  ?  ?  ?  ?  ?  ?  ? ?  ?Cognition Arousal/Alertness: Awake/alert ?Behavior During Therapy: [facility] for tasks assessed/performed ?Overall Cognitive Status: Within Functional Limits for tasks assessed ?  ?  ?  ?  ?  ?  ?  ?  ?  ?  ?  ?  ?  ?  ?  ?  ?  ?  ?  ? ?  ?Exercises Total Joint Exercises ?Ankle Circles/Pumps: AROM, Both, 15 reps, Supine ?Quad Sets: AROM, Both, 10 reps, Supine ?Heel Slides: AAROM, Left, Supine, 20 reps ?Straight Leg Raises: AAROM, Left, Supine, 20 reps ? ?  ?General Comments   ?  ?  ? ?Pertinent Vitals/Pain Pain Assessment ?Pain Assessment: 0-10 ?Pain Score: 7  ?Pain Location: L knee ?Pain Descriptors / Indicators:  Aching, Sore ?Pain Intervention(s): Limited activity within patient's tolerance, Monitored during session, Premedicated before session, Ice applied  ? ? ?Home Living   ?  ?  ?  ?  ?  ?  ?  ?  ?  ?   ?  ?Prior Function    ?  ?  ?   ? ?PT Goals (current goals can now be found in the care plan section) Acute Rehab PT Goals ?Patient Stated Goal: Regain IND ?PT Goal Formulation: With patient ?Time For Goal Achievement: 07/28/21 ?Potential to Achieve  Goals: Good ?Progress towards PT goals: Progressing toward goals ? ?  ?Frequency ? ? ? 7X/week ? ? ? ?  ?PT Plan Current plan remains appropriate  ? ? ?Co-evaluation   ?  ?  ?  ?  ? ?  ?AM-PAC PT "6 Clicks" Mobility   ?Outcome Measure ? Help needed turning from your back to your side while in a flat bed without using bedrails?: A Little ?Help needed moving from lying on your back to sitting on the side of a flat bed without using bedrails?: A Little ?Help needed moving to and from a bed to a chair (including a wheelchair)?: A Lot ?Help needed standing up from a chair using your arms (e.g., wheelchair or bedside chair)?: A Little ?Help needed to walk in hospital room?: A Little ?Help needed climbing 3-5 steps with a railing? : Total ?6 Click Score: 15 ? ?  ?End of Session Equipment Utilized During Treatment: Gait belt;Left knee immobilizer ?Activity Tolerance: Patient tolerated treatment well;Patient limited by fatigue ?Patient left: with call bell/phone within reach;Other (comment) ?Nurse Communication: Mobility status ?PT Visit Diagnosis: Unsteadiness on feet (R26.81);Difficulty in walking, not elsewhere classified (R26.2);Pain ?Pain - Right/Left: Left ?Pain - part of body: Knee ?  ? ? ?Time: 1100-1139 ?PT Time Calculation (min) (ACUTE ONLY): 39 min ? ?Charges:  $Gait Training: 8-22 mins ?$Therapeutic Exercise: 8-22 mins ?$Therapeutic Activity: 8-22 mins          ?          ? ?Debe Coder PT ?Acute Rehabilitation Services ?Pager [phone] ?Office [phone] ? ? ? ?Makari Sanko ?07/23/2021, 1:04 PM ? ?

## 2021-07-23 NOTE — Progress Notes (Signed)
Physical Therapy Treatment ?Patient Details ?Name: Allison [name] ?MRN: [id number] ?DOB: [birth year] ?Today's Date: 07/23/2021 ? ? ?History of Present Illness Pt s/p L TKR and with hx of R THR, bil RCR, and DJD ? ?  ?PT Comments  ? ? Pt continues very cooperative and progressing steadily with mobility with noted decreasing level of assist for most tasks but decreased tolerance this pm for gait 2* UE fatigue.  Pt eager for dc to [facility] place tomorrow.   ?Recommendations for follow up therapy are one component of a multi-disciplinary discharge planning process, led by the attending physician.  Recommendations may be updated based on patient status, additional functional criteria and insurance authorization. ? ?Follow Up Recommendations ? Skilled nursing-short term rehab (<3 hours/day) ?  ?  ?Assistance Recommended at Discharge Frequent or constant Supervision/Assistance  ?Patient can return home with the following A little help with bathing/dressing/bathroom;Assist for transportation;Help with stairs or ramp for entrance;Assistance with cooking/housework;A little help with walking and/or transfers ?  ?Equipment Recommendations ? None recommended by PT  ?  ?Recommendations for Other Services   ? ? ?  ?Precautions / Restrictions Precautions ?Precautions: Knee ?Required Braces or Orthoses: Knee Immobilizer - Left ?Knee Immobilizer - Left: Discontinue once straight leg raise with < 10 degree lag ?Restrictions ?Weight Bearing Restrictions: No ?Other Position/Activity Restrictions: WBAT  ?  ? ?Mobility ? Bed Mobility ?Overal bed mobility: Needs Assistance ?Bed Mobility: Supine to Sit, Sit to Supine ?  ?  ?Supine to sit: Min assist ?Sit to supine: Min assist ?  ?General bed mobility comments: cues for sequence and use of UEs to self assist. ?  ? ?Transfers ?Overall transfer level: Needs assistance ?Equipment used: Rolling walker (2 wheels) ?Transfers: Sit to/from Stand ?Sit to Stand: Min assist ?  ?  ?  ?  ?  ?General  transfer comment: cues for LE management and use of UEs to self assist. ?  ? ?Ambulation/Gait ?Ambulation/Gait assistance: Min assist ?Gait Distance (Feet): 43 Feet (and additional 38') ?Assistive device: Rolling walker (2 wheels) ?Gait Pattern/deviations: Step-to pattern, Decreased step length - right, Decreased step length - left, Shuffle, Trunk flexed ?Gait velocity: decr ?  ?  ?General Gait Details: Increased time with cues for sequence, posture and position from RW ? ? ?Stairs ?  ?  ?  ?  ?  ? ? ?Wheelchair Mobility ?  ? ?Modified Rankin (Stroke Patients Only) ?  ? ? ?  ?Balance Overall balance assessment: Needs assistance ?Sitting-balance support: No upper extremity supported, Feet supported ?Sitting balance-Leahy Scale: Good ?  ?  ?Standing balance support: Bilateral upper extremity supported ?Standing balance-Leahy Scale: Poor ?  ?  ?  ?  ?  ?  ?  ?  ?  ?  ?  ?  ?  ? ?  ?Cognition Arousal/Alertness: Awake/alert ?Behavior During Therapy: [facility] for tasks assessed/performed ?Overall Cognitive Status: Within Functional Limits for tasks assessed ?  ?  ?  ?  ?  ?  ?  ?  ?  ?  ?  ?  ?  ?  ?  ?  ?  ?  ?  ? ?  ?Exercises Total Joint Exercises ?Ankle Circles/Pumps: AROM, Both, 15 reps, Supine ?Quad Sets: AROM, Both, 10 reps, Supine ?Heel Slides: AAROM, Left, Supine, 20 reps ?Straight Leg Raises: AAROM, Left, Supine, 20 reps ? ?  ?General Comments   ?  ?  ? ?Pertinent Vitals/Pain Pain Assessment ?Pain Assessment: 0-10 ?Pain Score: 6  ?Pain Location: L  knee ?Pain Descriptors / Indicators: Aching, Sore ?Pain Intervention(s): Limited activity within patient's tolerance  ? ? ?Home Living   ?  ?  ?  ?  ?  ?  ?  ?  ?  ?   ?  ?Prior Function    ?  ?  ?   ? ?PT Goals (current goals can now be found in the care plan section) Acute Rehab PT Goals ?Patient Stated Goal: Regain IND ?PT Goal Formulation: With patient ?Time For Goal Achievement: 07/28/21 ?Potential to Achieve Goals: Good ?Progress towards PT goals: Progressing toward  goals ? ?  ?Frequency ? ? ? 7X/week ? ? ? ?  ?PT Plan Current plan remains appropriate  ? ? ?Co-evaluation   ?  ?  ?  ?  ? ?  ?AM-PAC PT "6 Clicks" Mobility   ?Outcome Measure ? Help needed turning from your back to your side while in a flat bed without using bedrails?: A Little ?Help needed moving from lying on your back to sitting on the side of a flat bed without using bedrails?: A Little ?Help needed moving to and from a bed to a chair (including a wheelchair)?: A Lot ?Help needed standing up from a chair using your arms (e.g., wheelchair or bedside chair)?: A Little ?Help needed to walk in hospital room?: A Little ?Help needed climbing 3-5 steps with a railing? : Total ?6 Click Score: 15 ? ?  ?End of Session Equipment Utilized During Treatment: Gait belt;Left knee immobilizer ?Activity Tolerance: Patient tolerated treatment well;Patient limited by fatigue ?Patient left: in bed;with call bell/phone within reach;with family/visitor present;with bed alarm set ?Nurse Communication: Mobility status ?PT Visit Diagnosis: Unsteadiness on feet (R26.81);Difficulty in walking, not elsewhere classified (R26.2);Pain ?Pain - Right/Left: Left ?Pain - part of body: Knee ?  ? ? ?Time: 1638-4665 ?PT Time Calculation (min) (ACUTE ONLY): 21 min ? ?Charges:  $Gait Training: 8-22 mins          ?          ? ?Debe Coder PT ?Acute Rehabilitation Services ?Pager [phone] ?Office [phone] ? ? ? ?Jansen Sciuto ?07/23/2021, 4:09 PM ? ?

## 2021-07-23 NOTE — TOC Progression Note (Signed)
Transition of Care (TOC) - Progression Note  ? ? ?Patient Details  ?Name: Allison [name] ?MRN: [id number] ?Date of Birth: [birth year] ? ?Transition of Care (TOC) CM/SW Contact  ?Ross Ludwig, LCSW ?Phone Number: ?07/23/2021, 1:47 PM ? ?Clinical Narrative:    ? ?CSW received insurance approval for patient to go to [facility].  Insurance authorization is [id number], reference number [id number].  CSW updated patient, she asked how will she get there, CSW informed she can go via EMS, however she said someone can bring her tomorrow to SNF.  CSW informed her that the weekday [facility] member who is covering tomorrow can talk to her and see if she wants EMS or if she has a ride.  CSW to continue to follow patient's progress throughout discharge planning. ? ?Expected Discharge Plan: [place] ?Barriers to Discharge: Ship broker, Continued Medical Work up ? ?Expected Discharge Plan and Services ?Expected Discharge Plan: [place] ?  ?  ?Post Acute Care Choice: [place] ?Living arrangements for the past 2 months: [place] ?                ?  ?  ?  ?  ?  ?  ?  ?  ?  ?  ? ? ?Social Determinants of Health (SDOH) Interventions ?  ? ?Readmission Risk Interventions ?   ? View : No data to display.  ?  ?  ?  ? ? ?

## 2021-07-23 NOTE — Progress Notes (Signed)
Subjective: ?2 Days Post-Op Procedure(s) (LRB): ?LEFT TOTAL KNEE ARTHROPLASTY (Left) ?Pain is well controlled today.  Was able to mobilize with physical therapy although this was somewhat difficult.  Voiding and tolerating a diet.  Pending discharge to nursing home. ? ?Objective: ?Vital signs in last 24 hours: ?Temp:  [97.7 ?F (36.5 ?C)-99.4 ?F (37.4 ?C)] 98.9 ?F (37.2 ?C) (04/23 1610) ?Pulse Rate:  [60-77] 70 (04/23 0528) ?Resp:  [15-18] 18 (04/23 0528) ?BP: (107-126)/(44-54) 126/52 (04/23 0528) ?SpO2:  [95 %-100 %] 97 % (04/23 0528) ? ?Intake/Output from previous day: ?04/22 0701 - 04/23 0700 ?In: 1197.4 [P.O.:600; I.V.:597.4] ?Out: -  ?Intake/Output this shift: ?Total I/O ?In: 240 [P.O.:240] ?Out: -  ? ?Recent Labs  ?  07/22/21 ?0307  ?HGB 9.9*  ? ? ?Recent Labs  ?  07/22/21 ?0307  ?WBC 8.6  ?RBC 3.11*  ?HCT 30.9*  ?PLT 214  ? ? ?Recent Labs  ?  07/22/21 ?0307  ?NA 138  ?K 3.5  ?CL 109  ?CO2 25  ?BUN 12  ?CREATININE 0.67  ?GLUCOSE 147*  ?CALCIUM 8.6*  ? ? ?No results for input(s): LABPT, INR in the last 72 hours. ? ?Sensation intact distally ?Intact pulses distally ?Dorsiflexion/Plantar flexion intact ?Incision: dressing C/D/I ?No cellulitis present ?Compartment soft ? ? ?Assessment/Plan: ?2 Days Post-Op Procedure(s) (LRB): ?LEFT TOTAL KNEE ARTHROPLASTY (Left) ?Up with therapy ?Discharge to SNF when medically cleared and bed availability has been established. ?The transitional care team is on board and aware of the patient's request for short-term skilled nursing.   ? ? ? ? ? ?Allison [name] ?07/23/2021, 8:32 AM ? ?

## 2021-07-23 NOTE — Plan of Care (Signed)
  Problem: Clinical Measurements: Goal: Respiratory complications will improve Outcome: Progressing   Problem: Clinical Measurements: Goal: Cardiovascular complication will be avoided Outcome: Progressing   Problem: Pain Managment: Goal: General experience of comfort will improve Outcome: Progressing   

## 2021-07-24 ENCOUNTER — Other Ambulatory Visit: Payer: Self-pay | Admitting: *Deleted

## 2021-07-24 DIAGNOSIS — D649 Anemia, unspecified: Secondary | ICD-10-CM | POA: Diagnosis not present

## 2021-07-24 DIAGNOSIS — E039 Hypothyroidism, unspecified: Secondary | ICD-10-CM | POA: Diagnosis not present

## 2021-07-24 DIAGNOSIS — M1990 Unspecified osteoarthritis, unspecified site: Secondary | ICD-10-CM | POA: Diagnosis not present

## 2021-07-24 DIAGNOSIS — K760 Fatty (change of) liver, not elsewhere classified: Secondary | ICD-10-CM | POA: Diagnosis not present

## 2021-07-24 DIAGNOSIS — M6281 Muscle weakness (generalized): Secondary | ICD-10-CM | POA: Diagnosis not present

## 2021-07-24 DIAGNOSIS — K5901 Slow transit constipation: Secondary | ICD-10-CM | POA: Diagnosis not present

## 2021-07-24 DIAGNOSIS — R262 Difficulty in walking, not elsewhere classified: Secondary | ICD-10-CM | POA: Diagnosis not present

## 2021-07-24 DIAGNOSIS — Z96659 Presence of unspecified artificial knee joint: Secondary | ICD-10-CM | POA: Diagnosis not present

## 2021-07-24 DIAGNOSIS — R2681 Unsteadiness on feet: Secondary | ICD-10-CM | POA: Diagnosis not present

## 2021-07-24 DIAGNOSIS — Z96652 Presence of left artificial knee joint: Secondary | ICD-10-CM | POA: Diagnosis not present

## 2021-07-24 DIAGNOSIS — J45909 Unspecified asthma, uncomplicated: Secondary | ICD-10-CM | POA: Diagnosis not present

## 2021-07-24 DIAGNOSIS — M1712 Unilateral primary osteoarthritis, left knee: Secondary | ICD-10-CM | POA: Diagnosis not present

## 2021-07-24 DIAGNOSIS — I10 Essential (primary) hypertension: Secondary | ICD-10-CM | POA: Diagnosis not present

## 2021-07-24 DIAGNOSIS — E785 Hyperlipidemia, unspecified: Secondary | ICD-10-CM | POA: Diagnosis not present

## 2021-07-24 DIAGNOSIS — I119 Hypertensive heart disease without heart failure: Secondary | ICD-10-CM | POA: Diagnosis not present

## 2021-07-24 DIAGNOSIS — R1312 Dysphagia, oropharyngeal phase: Secondary | ICD-10-CM | POA: Diagnosis not present

## 2021-07-24 LAB — GLUCOSE, CAPILLARY
Glucose-Capillary: 119 mg/dL — ABNORMAL HIGH (ref 70–99)
Glucose-Capillary: 147 mg/dL — ABNORMAL HIGH (ref 70–99)

## 2021-07-24 MED ORDER — METHOCARBAMOL 500 MG PO TABS
500.0000 mg | ORAL_TABLET | Freq: Three times a day (TID) | ORAL | 0 refills | Status: AC | PRN
Start: 2021-07-24 — End: ?

## 2021-07-24 MED ORDER — OXYCODONE HCL 5 MG PO TABS: 5.0000 mg | ORAL_TABLET | ORAL | 0 refills | Status: DC | PRN

## 2021-07-24 MED ORDER — ASPIRIN 81 MG PO CHEW: 81.0000 mg | CHEWABLE_TABLET | Freq: Two times a day (BID) | ORAL | 0 refills | Status: AC

## 2021-07-24 NOTE — Progress Notes (Signed)
Patient ID: Allison [name], female   DOB: Sep 05, 1945, 76 y.o.   MRN: [id number] ?There has been no acute changes in the patient's medical status.  Her left operative knee is stable.  Her vital signs are stable.  She can be discharged to short-term skilled nursing today. ?

## 2021-07-24 NOTE — Patient Outreach (Signed)
Per [place] eligible member currently resides in [facility].  Screening for potential post SNF care coordination/care management needs. ? ?Member's PCP at [facility] has Upstream care management services. ? ?Ms. Scally admitted to SNF on 07/24/21 after hospitalization for left total knee replacement. ? ?Will continue to follow for potential care management/care coordination needs. ? ? ?Marthenia Rolling, MSN, RN,BSN ?[place] Coordinator ?[phone] [facility]) ?[phone]  (Toll free office)   ?

## 2021-07-24 NOTE — Plan of Care (Signed)
  Problem: Coping: Goal: Level of anxiety will decrease Outcome: Progressing   Problem: Pain Managment: Goal: General experience of comfort will improve Outcome: Progressing   

## 2021-07-24 NOTE — TOC Transition Note (Signed)
Transition of Care (TOC) - CM/SW Discharge Note ? ?Patient Details  ?Name: [name] ?MRN: [id number] ?Date of Birth: 1945-04-13 ? ?Transition of Care (TOC) CM/SW Contact:  ?Sherie Don, LCSW ?Phone Number: ?07/24/2021, 10:30 AM ? ?Clinical Narrative: CSW confirmed with Lorenza Chick in admissions at [facility] that patient can be admitted today. Insurance authorization was completed. Discharge summary, discharge orders, and SNF transfer report faxed to facility in hub. Patient confirmed her friend/neighbor, Thurman Coyer, will transport her to the facility and CSW confirmed this with Ms. Dimas Aguas. Discharge packet completed. RN updated. TOC signing off. ? ?Final next level of care: [place] ?Barriers to Discharge: Barriers Resolved ? ?Patient Goals and CMS Choice ?Patient states their goals for this hospitalization and ongoing recovery are:: To go to SNF for rehab, then return back home. ?CMS Medicare.gov Compare Post Acute Care list provided to:: Patient ?Choice offered to / list presented to : Patient ? ?Discharge Placement ?PASRR number recieved: 07/22/21        ?Patient chooses bed at: [facility] ?Patient to be transferred to facility by: Thurman Coyer (friend/neighbor) ?Patient and family notified of of transfer: 07/24/21 ? ?Discharge Plan and Services ?Post Acute Care Choice: [place]          ?DME Arranged: N/A ?DME Agency: NA ? ?Readmission Risk Interventions ?   ? View : No data to display.  ?  ?  ?  ? ?

## 2021-07-24 NOTE — Discharge Summary (Signed)
?Patient ID: ?Allison [name] ?MRN: [id number] ?DOB/AGE: 1946-01-20 76 y.o. ? ?Admit date: 07/21/2021 ?Discharge date: 07/24/2021 ? ?Admission Diagnoses:  ?Principal Problem: ?  Unilateral primary osteoarthritis, left knee ?Active Problems: ?  Status post left knee replacement ?  Status post total left knee replacement ? ? ?Discharge Diagnoses:  ?Same ? ?Past Medical History:  ?Diagnosis Date  ? Asthmatic bronchitis   ? Carpal tunnel syndrome   ? Diverticulosis   ? DJD (degenerative joint disease) of hip   ? s/P R hip replacement  ? Elevated serum alkaline phosphatase level   ? Fatty liver disease, nonalcoholic   ? GERD (gastroesophageal reflux disease)   ? Hyperlipidemia   ? Internal hemorrhoids   ? Obesity   ? Osteoarthritis   ? Osteopenia   ? Post-operative nausea and vomiting   ? Pre-diabetes   ? Prediabetes   ? Thyroid disease   ? hypo  ? Vitamin D deficiency   ? ? ?Surgeries: Procedure(s): ?LEFT TOTAL KNEE ARTHROPLASTY on 07/21/2021 ?  ?Consultants:  ? ?Discharged Condition: Improved ? ?Hospital Course: Allison [name] is an 76 y.o. female who was admitted 07/21/2021 for operative treatment ofUnilateral primary osteoarthritis, left knee. Patient has severe unremitting pain that affects sleep, daily activities, and work/hobbies. After pre-op clearance the patient was taken to the operating room on 07/21/2021 and underwent  Procedure(s): ?LEFT TOTAL KNEE ARTHROPLASTY.   ? ?Patient was given perioperative antibiotics:  ?Anti-infectives (From admission, onward)  ? ? Start     Dose/Rate Route Frequency Ordered Stop  ? 07/21/21 1400  ceFAZolin (ANCEF) IVPB 1 g/50 mL premix       ? 1 g ?100 mL/hr over 30 Minutes Intravenous Every 6 hours 07/21/21 1041 07/22/21 0958  ? 07/21/21 1200  minocycline (MINOCIN) capsule 100 mg       ? 100 mg Oral 2 times daily 07/21/21 1041    ? 07/21/21 0600  ceFAZolin (ANCEF) IVPB 2g/100 mL premix       ? 2 g ?200 mL/hr over 30 Minutes Intravenous On call to O.R. 07/21/21 0524 07/21/21  0732  ? ?  ?  ? ?Patient was given sequential compression devices, early ambulation, and chemoprophylaxis to prevent DVT. ? ?Patient benefited maximally from hospital stay and there were no complications.   ? ?Recent vital signs: Patient Vitals for the past 24 hrs: ? BP Temp Temp src Pulse Resp SpO2  ?07/24/21 0541 (!) 125/51 98.4 ?F (36.9 ?C) Oral (!) 58 17 99 %  ?07/23/21 2118 (!) 132/51 98.3 ?F (36.8 ?C) Oral 66 17 100 %  ?  ? ?Recent laboratory studies:  ?Recent Labs  ?  07/22/21 ?0307  ?WBC 8.6  ?HGB 9.9*  ?HCT 30.9*  ?PLT 214  ?NA 138  ?K 3.5  ?CL 109  ?CO2 25  ?BUN 12  ?CREATININE 0.67  ?GLUCOSE 147*  ?CALCIUM 8.6*  ? ? ? ?Discharge Medications:   ?Allergies as of 07/24/2021   ?No Known Allergies ?  ? ?  ?Medication List  ?  ? ?TAKE these medications   ? ?albuterol 108 (90 Base) MCG/ACT inhaler ?Commonly known as: VENTOLIN HFA ?Take 2 inhalations tid as needed ?  ?aspirin 81 MG chewable tablet ?Chew 1 tablet (81 mg total) by mouth 2 (two) times daily. ?  ?atorvastatin 20 MG tablet ?Commonly known as: LIPITOR ?TAKE 1 TABLET EVERY DAY ?What changed: how much to take ?  ?Calcium 600 600 MG Tabs tablet ?Generic drug: calcium carbonate ?Take 600 mg by  mouth 2 (two) times daily with a meal. In the morning. Minocycline to be give 6 hours after fist dose of Calcium ?  ?diclofenac sodium 1 % Gel ?Commonly known as: VOLTAREN ?Apply 2-4 g topically 4 (four) times daily. ?What changed:  ?when to take this ?reasons to take this ?  ?furosemide 40 MG tablet ?Commonly known as: LASIX ?Take 20 mg by mouth daily. ?  ?levothyroxine 100 MCG tablet ?Commonly known as: SYNTHROID ?Take 100 mcg by mouth daily before breakfast. ?  ?methocarbamol 500 MG tablet ?Commonly known as: ROBAXIN ?Take 1 tablet (500 mg total) by mouth every 8 (eight) hours as needed for muscle spasms. ?  ?minocycline 100 MG capsule ?Commonly known as: MINOCIN ?Take 100 mg by mouth 2 (two) times daily. Take 6 hours after calcium in the morning. Wait 2 hours   later for the second calcium ?  ?oxyCODONE 5 MG immediate release tablet ?Commonly known as: Oxy IR/ROXICODONE ?Take 1-2 tablets (5-10 mg total) by mouth every 4 (four) hours as needed for moderate pain (pain score 4-6). ?  ?pimecrolimus 1 % cream ?Commonly known as: ELIDEL ?Apply 1 application. topically in the morning. ?  ?potassium chloride 10 MEQ CR capsule ?Commonly known as: MICRO-K ?Take 10 mEq by mouth daily. ?  ?triamcinolone cream 0.1 % ?Commonly known as: KENALOG ?Apply 1 application topically 2 (two) times daily. ?What changed:  ?when to take this ?reasons to take this ?  ?Vitamin D3 50 MCG (2000 UT) capsule ?Take 2,000 Units by mouth daily. ?  ? ?  ? ?  ?  ? ? ?  ?Durable Medical Equipment  ?(From admission, onward)  ?  ? ? ?  ? ?  Start     Ordered  ? 07/21/21 1042  DME 3 n 1  Once       ? 07/21/21 1041  ? 07/21/21 1042  DME Walker rolling  Once       ?Question Answer Comment  ?Walker: With 5 Inch Wheels   ?Patient needs a walker to treat with the following condition Status post total left knee replacement   ?  ? 07/21/21 1041  ? ?  ?  ? ?  ? ? ?Diagnostic Studies: DG BONE DENSITY (DXA) ? ?Result Date: 07/10/2021 ?EXAM: DUAL X-RAY ABSORPTIOMETRY (DXA) FOR BONE MINERAL DENSITY IMPRESSION: Referring Physician:  Leeroy Cha Your patient completed a bone mineral density test using GE Lunar iDXA system (analysis version: 16). Technologist: Pandora PATIENT: Name: Allison [name], Allison [name] Patient ID: [id number] Birth Date: 02-Jan-1946 Height: 62.0 in. Sex: Female Measured: 07/10/2021 Weight: 185.0 lbs. Indications: Advanced Age, Caucasian, Estrogen Deficient, Height Loss (781.91), Postmenopausal, Right hip replacement Fractures: NONE Treatments: Calcium (E943.0), Vitamin D (E933.5) ASSESSMENT: The BMD measured at Forearm Radius 33% is 0.719 g/cm2 with a T-score of -1.8. This patient is considered osteopenic/low bone mass according to [facility]) criteria. The quality of the exam is good.  Right hip excluded due to surgical hardware. The lumbar spine was excluded due to degenerative changes. Site Region Measured Date Measured Age [age]A BMD Significant CHANGE T-score Left Forearm Radius 33% 07/10/2021 73.9 -1.8 0.719 g/cm2 Left Forearm Radius 33% 03/18/2019 71.6 -1.10 0.731 g/cm2 Left Femur Neck 07/10/2021 73.9 -1.3 0.862 g/cm2 Left Femur Neck 03/18/2019 71.6 -1.2 0.871 g/cm2 Left Femur Total 07/10/2021 73.9 -0.3 0.967 g/cm2 Left Femur Total 03/18/2019 71.6 -0.6 0.936 g/cm2 World Health Organization [facility]) criteria for post-menopausal, Caucasian Women: Normal       T-score at or above -  1 SD Osteopenia   T-score between -1 and -2.5 SD Osteoporosis T-score at or below -2.5 SD RECOMMENDATION: 1. All patients should optimize calcium and vitamin D intake. 2. Consider FDA-approved medical therapies in postmenopausal women and men aged 34 years and older, based on the following: a. A hip or vertebral (clinical or morphometric) fracture. b. T-score = -2.5 at the femoral neck or spine after appropriate evaluation to exclude secondary causes. c. Low bone mass (T-score between -1.0 and -2.5 at the femoral neck or spine) and a 10-year probability of a hip fracture = 3% or a 10-year probability of a major osteoporosis-related fracture = 20% based on the [place]-adapted WHO algorithm. d. Clinician judgment and/or patient preferences may indicate treatment for people with 10-year fracture probabilities above or below these levels. FOLLOW-UP: Patients with diagnosis of osteoporosis or at high risk for fracture should have regular bone mineral density tests.? Patients eligible for Medicare are allowed routine testing every 2 years.? The testing frequency can be increased to one year for patients who have rapidly progressing disease, are receiving or discontinuing medical therapy to restore bone mass, or have additional risk factors. I have reviewed this study and agree with the findings. [facility] FRAX* 10-year  Probability of Fracture Based on femoral neck BMD: Femur (Left) Major Osteoporotic Fracture: 9.6% Hip Fracture:                1.5% Population:                  [place] (Caucasian) Risk Factors:                Non

## 2021-07-25 DIAGNOSIS — I10 Essential (primary) hypertension: Secondary | ICD-10-CM | POA: Diagnosis not present

## 2021-07-25 DIAGNOSIS — K5901 Slow transit constipation: Secondary | ICD-10-CM | POA: Diagnosis not present

## 2021-07-25 DIAGNOSIS — E785 Hyperlipidemia, unspecified: Secondary | ICD-10-CM | POA: Diagnosis not present

## 2021-07-25 DIAGNOSIS — M1990 Unspecified osteoarthritis, unspecified site: Secondary | ICD-10-CM | POA: Diagnosis not present

## 2021-07-25 DIAGNOSIS — D649 Anemia, unspecified: Secondary | ICD-10-CM | POA: Diagnosis not present

## 2021-07-25 DIAGNOSIS — E039 Hypothyroidism, unspecified: Secondary | ICD-10-CM | POA: Diagnosis not present

## 2021-07-25 DIAGNOSIS — K760 Fatty (change of) liver, not elsewhere classified: Secondary | ICD-10-CM | POA: Diagnosis not present

## 2021-07-25 DIAGNOSIS — M1712 Unilateral primary osteoarthritis, left knee: Secondary | ICD-10-CM | POA: Diagnosis not present

## 2021-07-25 DIAGNOSIS — J45909 Unspecified asthma, uncomplicated: Secondary | ICD-10-CM | POA: Diagnosis not present

## 2021-07-26 DIAGNOSIS — R2681 Unsteadiness on feet: Secondary | ICD-10-CM | POA: Diagnosis not present

## 2021-07-26 DIAGNOSIS — I119 Hypertensive heart disease without heart failure: Secondary | ICD-10-CM | POA: Diagnosis not present

## 2021-07-26 DIAGNOSIS — M1990 Unspecified osteoarthritis, unspecified site: Secondary | ICD-10-CM | POA: Diagnosis not present

## 2021-07-26 DIAGNOSIS — Z96652 Presence of left artificial knee joint: Secondary | ICD-10-CM | POA: Diagnosis not present

## 2021-07-26 DIAGNOSIS — M1712 Unilateral primary osteoarthritis, left knee: Secondary | ICD-10-CM | POA: Diagnosis not present

## 2021-07-27 DIAGNOSIS — I119 Hypertensive heart disease without heart failure: Secondary | ICD-10-CM | POA: Diagnosis not present

## 2021-07-27 DIAGNOSIS — M1990 Unspecified osteoarthritis, unspecified site: Secondary | ICD-10-CM | POA: Diagnosis not present

## 2021-07-27 DIAGNOSIS — Z96652 Presence of left artificial knee joint: Secondary | ICD-10-CM | POA: Diagnosis not present

## 2021-07-27 DIAGNOSIS — M1712 Unilateral primary osteoarthritis, left knee: Secondary | ICD-10-CM | POA: Diagnosis not present

## 2021-07-27 DIAGNOSIS — R2681 Unsteadiness on feet: Secondary | ICD-10-CM | POA: Diagnosis not present

## 2021-07-28 DIAGNOSIS — K5901 Slow transit constipation: Secondary | ICD-10-CM | POA: Diagnosis not present

## 2021-07-28 DIAGNOSIS — M1712 Unilateral primary osteoarthritis, left knee: Secondary | ICD-10-CM | POA: Diagnosis not present

## 2021-07-28 DIAGNOSIS — J45909 Unspecified asthma, uncomplicated: Secondary | ICD-10-CM | POA: Diagnosis not present

## 2021-07-28 DIAGNOSIS — Z96659 Presence of unspecified artificial knee joint: Secondary | ICD-10-CM | POA: Diagnosis not present

## 2021-07-28 DIAGNOSIS — I10 Essential (primary) hypertension: Secondary | ICD-10-CM | POA: Diagnosis not present

## 2021-08-01 DIAGNOSIS — I119 Hypertensive heart disease without heart failure: Secondary | ICD-10-CM | POA: Diagnosis not present

## 2021-08-01 DIAGNOSIS — M1712 Unilateral primary osteoarthritis, left knee: Secondary | ICD-10-CM | POA: Diagnosis not present

## 2021-08-01 DIAGNOSIS — M1990 Unspecified osteoarthritis, unspecified site: Secondary | ICD-10-CM | POA: Diagnosis not present

## 2021-08-01 DIAGNOSIS — R2681 Unsteadiness on feet: Secondary | ICD-10-CM | POA: Diagnosis not present

## 2021-08-01 DIAGNOSIS — Z96652 Presence of left artificial knee joint: Secondary | ICD-10-CM | POA: Diagnosis not present

## 2021-08-02 DIAGNOSIS — Z96652 Presence of left artificial knee joint: Secondary | ICD-10-CM | POA: Diagnosis not present

## 2021-08-02 DIAGNOSIS — M1990 Unspecified osteoarthritis, unspecified site: Secondary | ICD-10-CM | POA: Diagnosis not present

## 2021-08-02 DIAGNOSIS — R2681 Unsteadiness on feet: Secondary | ICD-10-CM | POA: Diagnosis not present

## 2021-08-02 DIAGNOSIS — M1712 Unilateral primary osteoarthritis, left knee: Secondary | ICD-10-CM | POA: Diagnosis not present

## 2021-08-02 DIAGNOSIS — I119 Hypertensive heart disease without heart failure: Secondary | ICD-10-CM | POA: Diagnosis not present

## 2021-08-03 ENCOUNTER — Ambulatory Visit (INDEPENDENT_AMBULATORY_CARE_PROVIDER_SITE_OTHER): Payer: Medicare Other | Admitting: Orthopaedic Surgery

## 2021-08-03 ENCOUNTER — Other Ambulatory Visit: Payer: Self-pay | Admitting: *Deleted

## 2021-08-03 ENCOUNTER — Encounter: Payer: Self-pay | Admitting: Orthopaedic Surgery

## 2021-08-03 DIAGNOSIS — M1990 Unspecified osteoarthritis, unspecified site: Secondary | ICD-10-CM | POA: Diagnosis not present

## 2021-08-03 DIAGNOSIS — Z96652 Presence of left artificial knee joint: Secondary | ICD-10-CM | POA: Diagnosis not present

## 2021-08-03 DIAGNOSIS — E785 Hyperlipidemia, unspecified: Secondary | ICD-10-CM | POA: Diagnosis not present

## 2021-08-03 DIAGNOSIS — I10 Essential (primary) hypertension: Secondary | ICD-10-CM | POA: Diagnosis not present

## 2021-08-03 DIAGNOSIS — R2681 Unsteadiness on feet: Secondary | ICD-10-CM | POA: Diagnosis not present

## 2021-08-03 DIAGNOSIS — I119 Hypertensive heart disease without heart failure: Secondary | ICD-10-CM | POA: Diagnosis not present

## 2021-08-03 DIAGNOSIS — M1712 Unilateral primary osteoarthritis, left knee: Secondary | ICD-10-CM | POA: Diagnosis not present

## 2021-08-03 DIAGNOSIS — D649 Anemia, unspecified: Secondary | ICD-10-CM | POA: Diagnosis not present

## 2021-08-03 NOTE — Progress Notes (Signed)
The patient is 2 weeks status post a left total knee arthroplasty.  She is convalescing in a skilled nursing facility due to no support at home in terms of family or other members.  She is walking with a walker.  She feels like she is making good progress. ? ?On exam her extension is full but her flexion is only to about 85 degrees.  I stressed the importance of getting a bending and moving.  Her incision looks good.  The staples have been removed and Steri-Strips applied.  Her calf is soft. ? ?She said that she is leaving skilled nursing the Saturday and we will set her up with home therapy.  I told her essential that she get her knee bending and moving and outpatient therapy will be crucial to get to there soon as she can.  I like see her back in 4 weeks to see how she is doing overall from mobility and range of motion standpoint but no x-rays are needed. ?

## 2021-08-03 NOTE — Patient Outreach (Signed)
[facility] Post- Acute Care Coordinator follow up. Per [place] eligible member currently resides in [facility]. Screening for post care coordination needs.  ? ?Member's PCP at [facility] has Upstream care management services. ? ?Facility site visit to [facility] skilled nursing facility. Met with Irine Seal, SNF SW concerning transition plan. Anticipated transition plan is to return home on Saturday with home health. SNF SW reports member could benefit from care management services. ? ?Will send notification to [facility] care management upon SNF discharge.  ? ? ?Marthenia Rolling, MSN, RN,BSN ?[place] Coordinator ?[phone] [facility]) ?[phone]  (Toll free office)  ? ? ?

## 2021-08-07 ENCOUNTER — Other Ambulatory Visit: Payer: Self-pay | Admitting: *Deleted

## 2021-08-07 NOTE — Patient Outreach (Signed)
[facility] Post- Acute Care Coordinator follow up. Per [facility] Ms. Yeo discharged from [facility] on 08/05/21. ? ?Attempting to send secure email to [facility] care management to make aware of discharge. However, email comes back "undeliverable." ? ?Member's PCP with [facility] has Upstream care management services available.  ? ? ?Marthenia Rolling, MSN, RN,BSN ?[place] Coordinator ?[phone] [facility]) ?[phone]  (Toll free office)  ?

## 2021-08-08 ENCOUNTER — Other Ambulatory Visit: Payer: Self-pay | Admitting: *Deleted

## 2021-08-08 ENCOUNTER — Telehealth: Payer: Self-pay | Admitting: *Deleted

## 2021-08-08 ENCOUNTER — Other Ambulatory Visit: Payer: Self-pay | Admitting: Orthopaedic Surgery

## 2021-08-08 DIAGNOSIS — Z96652 Presence of left artificial knee joint: Secondary | ICD-10-CM

## 2021-08-08 DIAGNOSIS — M1712 Unilateral primary osteoarthritis, left knee: Secondary | ICD-10-CM

## 2021-08-08 MED ORDER — OXYCODONE HCL 5 MG PO TABS: 5.0000 mg | ORAL_TABLET | Freq: Four times a day (QID) | ORAL | 0 refills | Status: DC | PRN

## 2021-08-08 MED ORDER — OXYCODONE HCL 5 MG PO TABS: 5.0000 mg | ORAL_TABLET | Freq: Four times a day (QID) | ORAL | 0 refills | Status: AC | PRN

## 2021-08-08 NOTE — Care Plan (Signed)
(  Late entry for 07/20/21) Spoke with patient prior to her upcoming Left total knee arthroplasty with Dr. Ninfa Linden on 07/21/21 at [facility]. She lives alone and would like to go to rehab after her discharge for just a week she states until she feels more comfortable. Discussed that therapy will work with her at hospital to make recommendations. She did discharge on 07/24/21 to [place]. Will continue to follow for needs. ?

## 2021-08-08 NOTE — Telephone Encounter (Signed)
Spoke with patient today and is doing well since discharge from SNF over the weekend. She was not given a refill of pain medication. She states she has been taking Tylenol, but concerned regarding starting OPPT this week. They ordered HHPT to start, but I had them cancel and got her in here for OPPT on Thursday. She was pleased with this. Could she get something for pain since she is starting OPPT this week. Thank you. ?

## 2021-08-08 NOTE — Telephone Encounter (Signed)
Ortho bundle Pre-op call completed (Late entry for 07/20/21). ?

## 2021-08-09 DIAGNOSIS — R7303 Prediabetes: Secondary | ICD-10-CM | POA: Diagnosis not present

## 2021-08-09 DIAGNOSIS — R7309 Other abnormal glucose: Secondary | ICD-10-CM | POA: Diagnosis not present

## 2021-08-09 DIAGNOSIS — I10 Essential (primary) hypertension: Secondary | ICD-10-CM | POA: Diagnosis not present

## 2021-08-09 DIAGNOSIS — L89322 Pressure ulcer of left buttock, stage 2: Secondary | ICD-10-CM | POA: Diagnosis not present

## 2021-08-10 ENCOUNTER — Ambulatory Visit: Payer: Medicare Other | Admitting: Physical Therapy

## 2021-08-10 ENCOUNTER — Other Ambulatory Visit: Payer: Self-pay

## 2021-08-10 ENCOUNTER — Encounter: Payer: Self-pay | Admitting: Physical Therapy

## 2021-08-10 DIAGNOSIS — M25562 Pain in left knee: Secondary | ICD-10-CM | POA: Diagnosis not present

## 2021-08-10 DIAGNOSIS — R2681 Unsteadiness on feet: Secondary | ICD-10-CM | POA: Diagnosis not present

## 2021-08-10 DIAGNOSIS — M25662 Stiffness of left knee, not elsewhere classified: Secondary | ICD-10-CM

## 2021-08-10 DIAGNOSIS — R600 Localized edema: Secondary | ICD-10-CM | POA: Diagnosis not present

## 2021-08-10 DIAGNOSIS — R2689 Other abnormalities of gait and mobility: Secondary | ICD-10-CM

## 2021-08-10 DIAGNOSIS — M6281 Muscle weakness (generalized): Secondary | ICD-10-CM

## 2021-08-10 NOTE — Therapy (Signed)
?OUTPATIENT PHYSICAL THERAPY LOWER EXTREMITY EVALUATION ? ? ?Patient Name: Allison [name] ?MRN: [id number] ?DOB:May 20, 1945, 76 y.o., female ?Today's Date: 08/10/2021 ? ? PT End of Session - 08/10/21 1119   ? ? Visit Number 1   ? Number of Visits 16   ? Date for PT Re-Evaluation 10/05/21   ? Authorization Type [employer] Medicare $20 copay   ? Progress Note Due on Visit 10   ? PT Start Time 1015   ? PT Stop Time 1051   ? PT Time Calculation (min) 36 min   ? Activity Tolerance Patient tolerated treatment well   ? Behavior During Therapy [facility] for tasks assessed/performed   ? ?  ?  ? ?  ? ? ?Past Medical History:  ?Diagnosis Date  ? Asthmatic bronchitis   ? Carpal tunnel syndrome   ? Diverticulosis   ? DJD (degenerative joint disease) of hip   ? s/P R hip replacement  ? Elevated serum alkaline phosphatase level   ? Fatty liver disease, nonalcoholic   ? GERD (gastroesophageal reflux disease)   ? Hyperlipidemia   ? Internal hemorrhoids   ? Obesity   ? Osteoarthritis   ? Osteopenia   ? Post-operative nausea and vomiting   ? Pre-diabetes   ? Prediabetes   ? Thyroid disease   ? hypo  ? Vitamin D deficiency   ? ?Past Surgical History:  ?Procedure Laterality Date  ? CARPAL TUNNEL RELEASE Bilateral   ? DENTAL SURGERY    ? JOINT REPLACEMENT  2004  ? R hip  ? [place], 2008  ? 2 left and 1 right  ? TOE FUSION  2000's  ? TOTAL KNEE ARTHROPLASTY Left 07/21/2021  ? Procedure: LEFT TOTAL KNEE ARTHROPLASTY;  Surgeon: [name], MD;  Location: [facility];  Service: Orthopedics;  Laterality: Left;  ? ?Patient Active Problem List  ? Diagnosis Date Noted  ? Status post total left knee replacement 07/22/2021  ? Status post left knee replacement 07/21/2021  ? Unilateral primary osteoarthritis, left knee 02/08/2021  ? Unilateral primary osteoarthritis, left hip 01/11/2021  ? Low back pain 01/11/2021  ? Pain in right shoulder 05/12/2020  ? Pain in right hip 02/10/2020  ? Pain in right foot 02/10/2020  ? Bilateral  primary osteoarthritis of knee 12/31/2019  ? Syncope and collapse 09/08/2019  ? Chronic pain of right knee 03/28/2016  ? Unilateral primary osteoarthritis, right knee 03/28/2016  ? Chondrocalcinosis of right knee 03/28/2016  ? Acute bronchitis 03/30/2015  ? HTN (hypertension) 03/04/2013  ? Osteopenia 03/04/2013  ? Dyspnea 01/20/2013  ? Bronchospasm, acute 11/22/2011  ? Urticaria 11/22/2011  ? Family history of colon cancer 09/09/2011  ? Obesity 09/09/2011  ? Hypokalemia 06/18/2011  ? Hyperlipidemia   ? Thyroid disease   ? Fatty liver disease, nonalcoholic   ? DJD (degenerative joint disease) of hip   ? ? ?PCP: Leeroy Cha, MD  ? ?REFERRING PROVIDER: [name], MD  ? ?REFERRING DIAG: M17.12 (ICD-10-CM) - Unilateral primary osteoarthritis, left knee Z96.652 (ICD-10-CM) - Status post total left knee replacement  ? ?THERAPY DIAG:  ?Acute pain of left knee - Plan: PT plan of care cert/re-cert ? ?Stiffness of left knee, not elsewhere classified - Plan: PT plan of care cert/re-cert ? ?Muscle weakness (generalized) - Plan: PT plan of care cert/re-cert ? ?Other abnormalities of gait and mobility - Plan: PT plan of care cert/re-cert ? ?Localized edema - Plan: PT plan of care cert/re-cert ? ?Unsteadiness on  feet - Plan: PT plan of care cert/re-cert ? ?ONSET DATE: 07/21/21 ? ?SUBJECTIVE:  ? ?SUBJECTIVE STATEMENT: ?Pt is a 76 y/o female who presents to OPPT s/p Lt TKA on 07/21/21.  She then went to SNF for ST-rehab for 12 days.  She is still walking with a RW at this time. ? ?PERTINENT HISTORY: ?Rt THA, OA, osteopenia ? ?PAIN:  ?Are you having pain? Yes: NPRS scale: 3 currently; up to 8/10; at best 3/10 ?Pain location: Lt knee ?Pain description: dull, ache, occasional sharp pains, throbbing, tightness, burning ?Aggravating factors: bending knee, worse in the AM ?Relieving factors: tylenol, sleeping with small pillow under knee ? ?PRECAUTIONS: None ? ?WEIGHT BEARING RESTRICTIONS No ? ?FALLS:  ?Has patient  fallen in last 6 months? No ? ?LIVING ENVIRONMENT: ?Lives with: lives alone ?Lives in: House/apartment ?Stairs: Yes: External: 5 steps; can reach both ?Has following equipment at home: Single point cane and Walker - 2 wheeled ? ?OCCUPATION: retired from [employer]; volunteers in [place] ? ?PLOF: Independent and Leisure: watch TV, play cards with neighbors, no regular exercise ? ?PATIENT GOALS return to volunteering, improve knee strength and flexion ? ? ?OBJECTIVE:  ? ?PATIENT SURVEYS:  ?08/10/21: FOTO 29 (predicted 56) ? ?COGNITION: ? Overall cognitive status: Within functional limits for tasks assessed   ?  ?SENSATION: ?WFL ? ?EDEMA: Joint Line: Lt 18"; Rt 14.75" ? ?LE ROM: ? ?Active ROM Right ?08/10/2021 Left ?08/10/2021  ?Knee flexion 126 98  ?Knee extension 0 -5  ?(seated LAQ)  ? (Blank rows = not tested) ? ?LE ROM: ? ?Passive ROM Right ?08/10/2021 Left ?08/10/2021  ?Knee flexion  105  ?Knee extension  0  ? (Blank rows = not tested) ? ?LE MMT: ? ?MMT Right ?08/10/2021 Left ?08/10/2021  ?Knee flexion  3-/5  ?Knee extension  3-/5  ? (Blank rows = not tested) ? ? ?GAIT: ?Distance walked: 100'  ?Assistive device utilized: Environmental consultant - 2 wheeled ?Level of assistance: Modified independence ?Comments: decreased stance on LLE and decreased hip/knee flexion on Lt ? ? ? ?TODAY'S TREATMENT: ?08/10/21: See HEP - performed trial reps PRN for comprehension with mod cues for technique; also discussed need to limit pillow under knee to maximize extension ? ? ?PATIENT EDUCATION:  ?Education details: HEP ?Person educated: Patient ?Education method: Explanation, Demonstration, and Handouts ?Education comprehension: verbalized understanding, returned demonstration, and needs further education ? ? ?HOME EXERCISE PROGRAM: ?Access Code: [email or web address] ?URL: [email or web address] ?Date: 08/10/2021 ?Prepared by: Faustino Congress ? ?Exercises ?- Quad Set  - 5-10 x daily - 7 x weekly - 1 sets - 5-10 reps - 5 sec  hold ?- Supine Heel Slide with Strap  - 5-10 x daily - 7 x weekly - 1 sets - 5-10 reps ?- Seated Knee Extension AROM  - 3-5 x daily - 7 x weekly - 1-2 sets - 10 reps - 5 sec hold ?- Seated Straight Leg Raise  - 3-5 x daily - 7 x weekly - 1-2 sets - 10 reps - 2-3 sec hold ?- Seated Knee Flexion AAROM  - 3-5 x daily - 7 x weekly - 1-2 sets - 10 reps - 5-10 sec hold ? ?ASSESSMENT: ? ?CLINICAL IMPRESSION: ?Patient is a 75 y.o. female who was seen today for physical therapy evaluation and treatment for Lt TKA. She demonstrates decreased ROM and strength, gait abnormalities and increased swelling affecting functional mobility.  Pt will benefit from PT to address deficits listed.  ? ? ?OBJECTIVE IMPAIRMENTS Abnormal gait, decreased balance,  decreased knowledge of use of DME, decreased mobility, difficulty walking, decreased ROM, decreased strength, increased edema, increased muscle spasms, and pain.  ? ?ACTIVITY LIMITATIONS cleaning, community activity, driving, yard work, shopping, and volunteering .  ? ?PERSONAL FACTORS 3+ comorbidities: Rt THA, OA, osteopenia  are also affecting patient's functional outcome.  ? ? ?REHAB POTENTIAL: Excellent ? ?CLINICAL DECISION MAKING: Evolving/moderate complexity ? ?EVALUATION COMPLEXITY: Moderate ? ? ?GOALS: ?Goals reviewed with patient? Yes ? ?SHORT TERM GOALS: Target date: 09/07/2021 ? ?Independent with initial HEP ?Goal status: INITIAL ? ?2.  Lt knee AROM 0-105 for improved function ?Goal status: INITIAL ? ?LONG TERM GOALS: Target date: 10/05/2021 ? ?Independent with final HEP ?Goal status: INITIAL ? ?2.  FOTO score improved to 56 ?Goal status: INITIAL ? ?3.  Lt knee AROM improved to 0-110 for improved function and mobility ?Goal status: INIITAL ? ?4.  Report pain < 3/10 with activity for improved function ?Goal status: INITIAL ? ?5.  Amb without AD without significant gait deviations for improved function and mobility. ?Goal status: INITIAL ? ? ? ?PLAN: ?PT FREQUENCY: 2x/week ? ?PT  DURATION: 8 weeks ? ?PLANNED INTERVENTIONS: Therapeutic exercises, Therapeutic activity, Neuromuscular re-education, Balance training, Gait training, Patient/Family education, Joint mobilization, Stair training,

## 2021-08-12 DIAGNOSIS — I10 Essential (primary) hypertension: Secondary | ICD-10-CM | POA: Diagnosis not present

## 2021-08-12 DIAGNOSIS — M1712 Unilateral primary osteoarthritis, left knee: Secondary | ICD-10-CM | POA: Diagnosis not present

## 2021-08-14 ENCOUNTER — Encounter: Payer: Self-pay | Admitting: Rehabilitative and Restorative Service Providers"

## 2021-08-14 ENCOUNTER — Ambulatory Visit: Payer: Medicare Other | Admitting: Rehabilitative and Restorative Service Providers"

## 2021-08-14 DIAGNOSIS — M25562 Pain in left knee: Secondary | ICD-10-CM | POA: Diagnosis not present

## 2021-08-14 DIAGNOSIS — M6281 Muscle weakness (generalized): Secondary | ICD-10-CM | POA: Diagnosis not present

## 2021-08-14 DIAGNOSIS — R600 Localized edema: Secondary | ICD-10-CM

## 2021-08-14 DIAGNOSIS — R2689 Other abnormalities of gait and mobility: Secondary | ICD-10-CM

## 2021-08-14 DIAGNOSIS — M25662 Stiffness of left knee, not elsewhere classified: Secondary | ICD-10-CM

## 2021-08-14 NOTE — Therapy (Addendum)
OUTPATIENT PHYSICAL THERAPY TREATMENT NOTE   Patient Name: Allison [name] MRN: [id number] DOB:[birth year], 76 y.o., female Today's Date: 08/14/2021  PCP: Leeroy Cha, MD REFERRING PROVIDER: [name], MD  END OF SESSION:   PT End of Session - 08/14/21 1140     Visit Number 2    Number of Visits 16    Date for PT Re-Evaluation 10/05/21    Authorization Type [employer] Medicare $20 copay    Progress Note Due on Visit 10    PT Start Time 1045    PT Stop Time 1140    PT Time Calculation (min) 55 min    Activity Tolerance Patient tolerated treatment well    Behavior During Therapy WFL for tasks assessed/performed             Past Medical History:  Diagnosis Date   Asthmatic bronchitis    Carpal tunnel syndrome    Diverticulosis    DJD (degenerative joint disease) of hip    s/P R hip replacement   Elevated serum alkaline phosphatase level    Fatty liver disease, nonalcoholic    GERD (gastroesophageal reflux disease)    Hyperlipidemia    Internal hemorrhoids    Obesity    Osteoarthritis    Osteopenia    Post-operative nausea and vomiting    Pre-diabetes    Prediabetes    Thyroid disease    hypo   Vitamin D deficiency    Past Surgical History:  Procedure Laterality Date   CARPAL TUNNEL RELEASE Bilateral    DENTAL SURGERY     JOINT REPLACEMENT  2004   R hip   [place], 2008   2 left and 1 right   TOE FUSION  2000's   TOTAL KNEE ARTHROPLASTY Left 07/21/2021   Procedure: LEFT TOTAL KNEE ARTHROPLASTY;  Surgeon: [name], MD;  Location: [facility];  Service: Orthopedics;  Laterality: Left;   Patient Active Problem List   Diagnosis Date Noted   Status post total left knee replacement 07/22/2021   Status post left knee replacement 07/21/2021   Unilateral primary osteoarthritis, left knee 02/08/2021   Unilateral primary osteoarthritis, left hip 01/11/2021   Low back pain 01/11/2021   Pain in right shoulder  05/12/2020   Pain in right hip 02/10/2020   Pain in right foot 02/10/2020   Bilateral primary osteoarthritis of knee 12/31/2019   Syncope and collapse 09/08/2019   Chronic pain of right knee 03/28/2016   Unilateral primary osteoarthritis, right knee 03/28/2016   Chondrocalcinosis of right knee 03/28/2016   Acute bronchitis 03/30/2015   HTN (hypertension) 03/04/2013   Osteopenia 03/04/2013   Dyspnea 01/20/2013   Bronchospasm, acute 11/22/2011   Urticaria 11/22/2011   Family history of colon cancer 09/09/2011   Obesity 09/09/2011   Hypokalemia 06/18/2011   Hyperlipidemia    Thyroid disease    Fatty liver disease, nonalcoholic    DJD (degenerative joint disease) of hip     REFERRING DIAG: M17.12 (ICD-10-CM) - Unilateral primary osteoarthritis, left knee Z96.652 (ICD-10-CM) - Status post total left knee replacement   THERAPY DIAG:  Other abnormalities of gait and mobility  Localized edema  Stiffness of left knee, not elsewhere classified  Acute pain of left knee  Muscle weakness (generalized)  PERTINENT HISTORY: Rt THA, OA, osteopenia  PRECAUTIONS: None  SUBJECTIVE: Allison [name] is doing a great job with her HEP compliance.  She is no longer using a pillow under her knee at night.  PAIN:  Are you having pain? Yes: NPRS scale: 2-8/10 Pain location: Superior and medial L knee Pain description: Can be sharp, sore Aggravating factors: Flexion Relieving factors: Ice, tylenol, muscle relaxer   OBJECTIVE: (objective measures completed at initial evaluation unless otherwise dated)  OBJECTIVE:    PATIENT SURVEYS:  08/10/21: FOTO 29 (predicted 43)   COGNITION:           Overall cognitive status: Within functional limits for tasks assessed                          SENSATION: WFL   EDEMA: Joint Line: Lt 18"; Rt 14.75"   LE ROM:   Active ROM Right 08/10/2021 Left 08/10/2021  Knee flexion 126 98  Knee extension 0 -5  (seated LAQ)   (Blank rows = not tested)   LE ROM:    Passive ROM Right 08/10/2021 Left 08/10/2021  Knee flexion   105  Knee extension   0   (Blank rows = not tested)   LE MMT:   MMT Right 08/10/2021 Left 08/10/2021  Knee flexion   3-/5  Knee extension   3-/5   (Blank rows = not tested)     GAIT: Distance walked: 100'        Assistive device utilized: Environmental consultant - 2 wheeled Level of assistance: Modified independence Comments: decreased stance on LLE and decreased hip/knee flexion on Lt       TODAY'S TREATMENT:  08/14/2021: Recumbent bike Seat 4 for 8 minutes Tailgate knee flexion 1 minute L knee AAROM flexion (R pushes L into flexion seated) 10X 10 seconds (2 sets of 5) Quadriceps sets with L heel prop 2 sets of 10 for 5 seconds   Therapeutic Activities: Leg Press with slow eccentrics for sit to stand Double Leg 15X with 87# L leg only 2 sets of 10 50#  Neuromuscular re-education: 3X 20 seconds each eyes open; head turning; eyes closed  Vaso 10 minutes L knee 34* Medium Pressure   08/10/21: See HEP - performed trial reps PRN for comprehension with mod cues for technique; also discussed need to limit pillow under knee to maximize extension     PATIENT EDUCATION:  Education details: HEP Person educated: Patient Education method: Explanation, Demonstration, and Handouts Education comprehension: verbalized understanding, returned demonstration, and needs further education     HOME EXERCISE PROGRAM: Access Code: [email or web address] URL: [email or web address] Date: 08/10/2021 Prepared by: Faustino Congress   Exercises - Quad Set  - 5-10 x daily - 7 x weekly - 1 sets - 5-10 reps - 5 sec hold - Supine Heel Slide with Strap  - 5-10 x daily - 7 x weekly - 1 sets - 5-10 reps - Seated Knee Extension AROM  - 3-5 x daily - 7 x weekly - 1-2 sets - 10 reps - 5 sec hold - Seated Straight Leg Raise  - 3-5 x daily - 7 x weekly - 1-2 sets - 10 reps - 2-3 sec hold - Seated Knee Flexion AAROM  - 3-5 x daily - 7 x weekly - 1-2 sets  - 10 reps - 5-10 sec hold   ASSESSMENT:   CLINICAL IMPRESSION: Allison [name] is looking good for < 4 weeks post-surgery.  She needs work in all areas (extension and flexion AROM, edema control and quadriceps strength).  Continue current POC.     OBJECTIVE IMPAIRMENTS Abnormal gait, decreased balance, decreased knowledge of use of DME, decreased mobility, difficulty walking, decreased ROM, decreased strength, increased  edema, increased muscle spasms, and pain.    ACTIVITY LIMITATIONS cleaning, community activity, driving, yard work, shopping, and volunteering .    PERSONAL FACTORS 3+ comorbidities: Rt THA, OA, osteopenia  are also affecting patient's functional outcome.      REHAB POTENTIAL: Excellent   CLINICAL DECISION MAKING: Evolving/moderate complexity   EVALUATION COMPLEXITY: Moderate     GOALS: Goals reviewed with patient? Yes   SHORT TERM GOALS: Target date: 09/07/2021   Independent with initial HEP Goal status: INITIAL   2.  Lt knee AROM 0-105 for improved function Goal status: INITIAL   LONG TERM GOALS: Target date: 10/05/2021   Independent with final HEP Goal status: INITIAL   2.  FOTO score improved to 56 Goal status: INITIAL   3.  Lt knee AROM improved to 0-110 for improved function and mobility Goal status: INIITAL   4.  Report pain < 3/10 with activity for improved function Goal status: INITIAL   5.  Amb without AD without significant gait deviations for improved function and mobility. Goal status: INITIAL       PLAN: PT FREQUENCY: 2x/week   PT DURATION: 8 weeks   PLANNED INTERVENTIONS: Therapeutic exercises, Therapeutic activity, Neuromuscular re-education, Balance training, Gait training, Patient/Family education, Joint mobilization, Stair training, DME instructions, Aquatic Therapy, Dry Needling, Electrical stimulation, Cryotherapy, Moist heat, Taping, Vasopneumatic device, and Manual therapy   PLAN FOR NEXT SESSION: Continue to work on maximizing  extension and flexion AROM, quadriceps activation, vaso PRN for swelling       Farley Ly, PT, MPT 08/14/2021, 11:41 AM

## 2021-08-16 ENCOUNTER — Encounter: Payer: Self-pay | Admitting: Rehabilitative and Restorative Service Providers"

## 2021-08-16 ENCOUNTER — Ambulatory Visit: Payer: Medicare Other | Admitting: Rehabilitative and Restorative Service Providers"

## 2021-08-16 DIAGNOSIS — M6281 Muscle weakness (generalized): Secondary | ICD-10-CM

## 2021-08-16 DIAGNOSIS — M25562 Pain in left knee: Secondary | ICD-10-CM | POA: Diagnosis not present

## 2021-08-16 DIAGNOSIS — R600 Localized edema: Secondary | ICD-10-CM | POA: Diagnosis not present

## 2021-08-16 DIAGNOSIS — R2689 Other abnormalities of gait and mobility: Secondary | ICD-10-CM | POA: Diagnosis not present

## 2021-08-16 DIAGNOSIS — R2681 Unsteadiness on feet: Secondary | ICD-10-CM

## 2021-08-16 DIAGNOSIS — M25662 Stiffness of left knee, not elsewhere classified: Secondary | ICD-10-CM | POA: Diagnosis not present

## 2021-08-16 NOTE — Therapy (Addendum)
?OUTPATIENT PHYSICAL THERAPY TREATMENT NOTE ? ? ?Patient Name: Allison [name] ?MRN: [id number] ?DOB:Apr 05, 1945, 76 y.o., female ?Today's Date: 08/16/2021 ? ?PCP: Leeroy Cha, MD ?REFERRING PROVIDER: [name], MD ? ?END OF SESSION:  ? PT End of Session - 08/16/21 1433   ? ? Visit Number 3   ? Number of Visits 16   ? Date for PT Re-Evaluation 10/05/21   ? Authorization Type [employer] Medicare $20 copay   ? Progress Note Due on Visit 10   ? PT Start Time 1420   ? PT Stop Time 1510   ? PT Time Calculation (min) 50 min   ? Activity Tolerance Patient tolerated treatment well   ? Behavior During Therapy [facility] for tasks assessed/performed   ? ?  ?  ? ?  ? ? ? ?Past Medical History:  ?Diagnosis Date  ? Asthmatic bronchitis   ? Carpal tunnel syndrome   ? Diverticulosis   ? DJD (degenerative joint disease) of hip   ? s/P R hip replacement  ? Elevated serum alkaline phosphatase level   ? Fatty liver disease, nonalcoholic   ? GERD (gastroesophageal reflux disease)   ? Hyperlipidemia   ? Internal hemorrhoids   ? Obesity   ? Osteoarthritis   ? Osteopenia   ? Post-operative nausea and vomiting   ? Pre-diabetes   ? Prediabetes   ? Thyroid disease   ? hypo  ? Vitamin D deficiency   ? ?Past Surgical History:  ?Procedure Laterality Date  ? CARPAL TUNNEL RELEASE Bilateral   ? DENTAL SURGERY    ? JOINT REPLACEMENT  2004  ? R hip  ? [place], 2008  ? 2 left and 1 right  ? TOE FUSION  2000's  ? TOTAL KNEE ARTHROPLASTY Left 07/21/2021  ? Procedure: LEFT TOTAL KNEE ARTHROPLASTY;  Surgeon: [name], MD;  Location: [facility];  Service: Orthopedics;  Laterality: Left;  ? ?Patient Active Problem List  ? Diagnosis Date Noted  ? Status post total left knee replacement 07/22/2021  ? Status post left knee replacement 07/21/2021  ? Unilateral primary osteoarthritis, left knee 02/08/2021  ? Unilateral primary osteoarthritis, left hip 01/11/2021  ? Low back pain 01/11/2021  ? Pain in right shoulder  05/12/2020  ? Pain in right hip 02/10/2020  ? Pain in right foot 02/10/2020  ? Bilateral primary osteoarthritis of knee 12/31/2019  ? Syncope and collapse 09/08/2019  ? Chronic pain of right knee 03/28/2016  ? Unilateral primary osteoarthritis, right knee 03/28/2016  ? Chondrocalcinosis of right knee 03/28/2016  ? Acute bronchitis 03/30/2015  ? HTN (hypertension) 03/04/2013  ? Osteopenia 03/04/2013  ? Dyspnea 01/20/2013  ? Bronchospasm, acute 11/22/2011  ? Urticaria 11/22/2011  ? Family history of colon cancer 09/09/2011  ? Obesity 09/09/2011  ? Hypokalemia 06/18/2011  ? Hyperlipidemia   ? Thyroid disease   ? Fatty liver disease, nonalcoholic   ? DJD (degenerative joint disease) of hip   ? ? ?REFERRING DIAG: M17.12 (ICD-10-CM) - Unilateral primary osteoarthritis, left knee Z96.652 (ICD-10-CM) - Status post total left knee replacement  ? ?THERAPY DIAG:  ?Acute pain of left knee ? ?Muscle weakness (generalized) ? ?Stiffness of left knee, not elsewhere classified ? ?Other abnormalities of gait and mobility ? ?Localized edema ? ?Unsteadiness on feet ? ?PERTINENT HISTORY: Rt THA, OA, osteopenia ? ?PRECAUTIONS: None ? ?SUBJECTIVE: Pt indicated having one spot where pain can shoot.  Otherwise tight today.  ? ?PAIN:  ? NPRS scale: 2-8/10 ?  Pain location: Superior and medial L knee ?Pain description: Can be sharp, sore ?Aggravating factors: end ranges, twisting ?Relieving factors: Ice, tylenol, muscle relaxer ? ? ?OBJECTIVE: (objective measures completed at initial evaluation unless otherwise dated) ? ? ?PATIENT SURVEYS:  ?08/10/2021: FOTO 29 (predicted 56) ?  ?COGNITION: ?          08/10/2021 Overall cognitive status: Within functional limits for tasks assessed               ?           ?SENSATION: ?08/10/2021 WFL ?  ?EDEMA: 08/10/2021 Joint Line: Lt 18"; Rt 14.75" ?  ?LE ROM: ?  ?Active ROM Right ?08/10/2021 Left ?08/10/2021 Left ?08/16/2021  ?Knee flexion 126 98 112 ?In supine heel slide  ?Knee extension 0 -5  ?(seated LAQ)  -2 ?Seated LAQ  ? (Blank rows = not tested) ?  ?LE ROM: ?  ?Passive ROM Right ?08/10/2021 Left ?08/10/2021  ?Knee flexion   105  ?Knee extension   0  ? (Blank rows = not tested) ?  ?LE MMT: ?  ?MMT Right ?08/10/2021 Left ?08/10/2021  ?Knee flexion   3-/5  ?Knee extension   3-/5  ? (Blank rows = not tested) ?  ?  ?GAIT: ?08/16/2021: Ambulation c SPC into clinic.   ? ?08/10/2021 ?Distance walked: 100'        ?Assistive device utilized: Environmental consultant - 2 wheeled ?Level of assistance: Modified independence ?Comments: decreased stance on LLE and decreased hip/knee flexion on Lt ?  ?  ?  ?TODAY'S TREATMENT: ?08/16/2021: ?Therex: ?Recumbent bike Seat 4 partial to full reverse circles 7 mins ?Supine heel prop 1 min (cues for home use to improve tightness symptoms c extension) ? Runner stretch on incline board Lt leg posterior 30 sec x 3 ? Supine bridge 3 sec hold x 15 ? Seated LAQ 2.5 lbs full range with pause in end ranges x 15 ? ? Verbal review of existing HEP.  ? ? ?Therapeutic Activities (to aid transfers, ambulation, stairs): ?Leg press double leg 87 lbs x 15 slow lowering, Lt leg only 50 lbs 2 x 10 ? ? ?Neuro Re-ed: ? Church pew anterior/posterior ankle strategy 2 mins ? Retro step x 15, performed bilateral  ? ?Manual: ? Seated Lt knee distraction/IR c flexion mobilization c movements.  ? ?Vaso ? Supine Lt knee 34 degrees medium compression 10 mins in elevation ? ? ?08/14/2021: ?Recumbent bike Seat 4 for 8 minutes ?Tailgate knee flexion 1 minute ?L knee AAROM flexion (R pushes L into flexion seated) 10X 10 seconds (2 sets of 5) ?Quadriceps sets with L heel prop 2 sets of 10 for 5 seconds ? ? ?Therapeutic Activities: ?Leg Press with slow eccentrics for sit to stand ?Double Leg 15X with 87# ?L leg only 2 sets of 10 50# ? ?Neuromuscular re-education: 3X 20 seconds each eyes open; head turning; eyes closed ? ? ?08/10/21: See HEP - performed trial reps PRN for comprehension with mod cues for technique; also discussed need to limit pillow  under knee to maximize extension ?  ?  ?PATIENT EDUCATION:  ?08/10/2021 ?Education details: HEP ?Person educated: Patient ?Education method: Explanation, Demonstration, and Handouts ?Education comprehension: verbalized understanding, returned demonstration, and needs further education ?  ?  ?HOME EXERCISE PROGRAM: ?08/10/2021 ?Access Code: [email or web address] ?URL: [email or web address] ?Date: 08/10/2021 ?Prepared by: Faustino Congress ?  ?Exercises ?- Quad Set  - 5-10 x daily - 7 x weekly - 1 sets - 5-10 reps - 5 sec hold ?-  Supine Heel Slide with Strap  - 5-10 x daily - 7 x weekly - 1 sets - 5-10 reps ?- Seated Knee Extension AROM  - 3-5 x daily - 7 x weekly - 1-2 sets - 10 reps - 5 sec hold ?- Seated Straight Leg Raise  - 3-5 x daily - 7 x weekly - 1-2 sets - 10 reps - 2-3 sec hold ?- Seated Knee Flexion AAROM  - 3-5 x daily - 7 x weekly - 1-2 sets - 10 reps - 5-10 sec hold ?  ?ASSESSMENT: ?  ?CLINICAL IMPRESSION: ?Recheck of AROM showed good improvements at this time.  Quality of movement can improve and reduce symptoms in those movements.  Pt to benefit from continued skilled PT services to address strength and movement coordination deficits as well.  ?  ?  ?OBJECTIVE IMPAIRMENTS Abnormal gait, decreased balance, decreased knowledge of use of DME, decreased mobility, difficulty walking, decreased ROM, decreased strength, increased edema, increased muscle spasms, and pain.  ?  ?ACTIVITY LIMITATIONS cleaning, community activity, driving, yard work, shopping, and volunteering .  ?  ?PERSONAL FACTORS 3+ comorbidities: Rt THA, OA, osteopenia  are also affecting patient's functional outcome.  ?  ?  ?REHAB POTENTIAL: Excellent ?  ?CLINICAL DECISION MAKING: Evolving/moderate complexity ?  ?EVALUATION COMPLEXITY: Moderate ?  ?  ?GOALS: ?Goals reviewed with patient? Yes ?  ?SHORT TERM GOALS: Target date: 09/07/2021 ?  ?Independent with initial HEP ?Goal status: on going - assessed 08/16/2021 ?  ?2.  Lt knee AROM 0-105 for  improved function ?Goal status: on going - assessed 08/16/2021 ?  ?LONG TERM GOALS: Target date: 10/05/2021 ?  ?Independent with final HEP ?Goal status: INITIAL ?  ?2.  FOTO score improved to 56 ?Goal status:

## 2021-08-22 DIAGNOSIS — E7800 Pure hypercholesterolemia, unspecified: Secondary | ICD-10-CM | POA: Diagnosis not present

## 2021-08-22 DIAGNOSIS — J45909 Unspecified asthma, uncomplicated: Secondary | ICD-10-CM | POA: Diagnosis not present

## 2021-08-22 DIAGNOSIS — M170 Bilateral primary osteoarthritis of knee: Secondary | ICD-10-CM | POA: Diagnosis not present

## 2021-08-22 DIAGNOSIS — N183 Chronic kidney disease, stage 3 unspecified: Secondary | ICD-10-CM | POA: Diagnosis not present

## 2021-08-22 DIAGNOSIS — Z96652 Presence of left artificial knee joint: Secondary | ICD-10-CM | POA: Diagnosis not present

## 2021-08-22 DIAGNOSIS — E039 Hypothyroidism, unspecified: Secondary | ICD-10-CM | POA: Diagnosis not present

## 2021-08-22 DIAGNOSIS — K760 Fatty (change of) liver, not elsewhere classified: Secondary | ICD-10-CM | POA: Diagnosis not present

## 2021-08-22 DIAGNOSIS — I10 Essential (primary) hypertension: Secondary | ICD-10-CM | POA: Diagnosis not present

## 2021-08-23 ENCOUNTER — Ambulatory Visit: Payer: Medicare Other | Admitting: Physical Therapy

## 2021-08-23 ENCOUNTER — Encounter: Payer: Self-pay | Admitting: Physical Therapy

## 2021-08-23 DIAGNOSIS — R600 Localized edema: Secondary | ICD-10-CM

## 2021-08-23 DIAGNOSIS — M25662 Stiffness of left knee, not elsewhere classified: Secondary | ICD-10-CM | POA: Diagnosis not present

## 2021-08-23 DIAGNOSIS — R2681 Unsteadiness on feet: Secondary | ICD-10-CM

## 2021-08-23 DIAGNOSIS — M25562 Pain in left knee: Secondary | ICD-10-CM

## 2021-08-23 DIAGNOSIS — M6281 Muscle weakness (generalized): Secondary | ICD-10-CM | POA: Diagnosis not present

## 2021-08-23 DIAGNOSIS — R2689 Other abnormalities of gait and mobility: Secondary | ICD-10-CM

## 2021-08-23 NOTE — Therapy (Signed)
OUTPATIENT PHYSICAL THERAPY TREATMENT NOTE   Patient Name: Allison [name] MRN: [id number] DOB:[birth year], 76 y.o., female 40 Date: 08/23/2021  PCP: Leeroy Cha, MD REFERRING PROVIDER: [name], MD  END OF SESSION:   PT End of Session - 08/23/21 0943     Visit Number 4    Number of Visits 16    Date for PT Re-Evaluation 10/05/21    Authorization Type [employer] Medicare $20 copay    Progress Note Due on Visit 10    PT Start Time 0930    PT Stop Time 1012    PT Time Calculation (min) 42 min    Activity Tolerance Patient tolerated treatment well    Behavior During Therapy WFL for tasks assessed/performed               Past Medical History:  Diagnosis Date   Asthmatic bronchitis    Carpal tunnel syndrome    Diverticulosis    DJD (degenerative joint disease) of hip    s/P R hip replacement   Elevated serum alkaline phosphatase level    Fatty liver disease, nonalcoholic    GERD (gastroesophageal reflux disease)    Hyperlipidemia    Internal hemorrhoids    Obesity    Osteoarthritis    Osteopenia    Post-operative nausea and vomiting    Pre-diabetes    Prediabetes    Thyroid disease    hypo   Vitamin D deficiency    Past Surgical History:  Procedure Laterality Date   CARPAL TUNNEL RELEASE Bilateral    DENTAL SURGERY     JOINT REPLACEMENT  2004   R hip   [place], 2008   2 left and 1 right   TOE FUSION  2000's   TOTAL KNEE ARTHROPLASTY Left 07/21/2021   Procedure: LEFT TOTAL KNEE ARTHROPLASTY;  Surgeon: [name], MD;  Location: [facility];  Service: Orthopedics;  Laterality: Left;   Patient Active Problem List   Diagnosis Date Noted   Status post total left knee replacement 07/22/2021   Status post left knee replacement 07/21/2021   Unilateral primary osteoarthritis, left knee 02/08/2021   Unilateral primary osteoarthritis, left hip 01/11/2021   Low back pain 01/11/2021   Pain in right shoulder  05/12/2020   Pain in right hip 02/10/2020   Pain in right foot 02/10/2020   Bilateral primary osteoarthritis of knee 12/31/2019   Syncope and collapse 09/08/2019   Chronic pain of right knee 03/28/2016   Unilateral primary osteoarthritis, right knee 03/28/2016   Chondrocalcinosis of right knee 03/28/2016   Acute bronchitis 03/30/2015   HTN (hypertension) 03/04/2013   Osteopenia 03/04/2013   Dyspnea 01/20/2013   Bronchospasm, acute 11/22/2011   Urticaria 11/22/2011   Family history of colon cancer 09/09/2011   Obesity 09/09/2011   Hypokalemia 06/18/2011   Hyperlipidemia    Thyroid disease    Fatty liver disease, nonalcoholic    DJD (degenerative joint disease) of hip     REFERRING DIAG: M17.12 (ICD-10-CM) - Unilateral primary osteoarthritis, left knee Z96.652 (ICD-10-CM) - Status post total left knee replacement   THERAPY DIAG:  Acute pain of left knee  Muscle weakness (generalized)  Stiffness of left knee, not elsewhere classified  Other abnormalities of gait and mobility  Localized edema  Unsteadiness on feet  PERTINENT HISTORY: Rt THA, OA, osteopenia  PRECAUTIONS: None  SUBJECTIVE:  c/o tightness in the knee, as well as soreness  PAIN:   NPRS scale: 4-5/10 Pain location: Superior  and medial L knee Pain description: Can be sharp, sore Aggravating factors: end ranges, twisting Relieving factors: Ice, tylenol, muscle relaxer   OBJECTIVE: (objective measures completed at initial evaluation unless otherwise dated)   PATIENT SURVEYS:  08/10/2021: FOTO 29 (predicted 56)   EDEMA: 08/10/2021 Joint Line: Lt 18"; Rt 14.75"   LE ROM:   Active ROM Right 08/10/2021 Left 08/10/2021 Left 08/16/2021 Left 08/23/21  Knee flexion 126 98 112 In supine heel slide 120  Knee extension 0 -5  (seated LAQ) -2 Seated LAQ 0 (Seated LAQ)   (Blank rows = not tested)   LE ROM:   Passive ROM Left 08/10/2021  Knee flexion 105  Knee extension 0   (Blank rows = not tested)    LE MMT:   MMT Left 08/10/2021  Knee flexion 3-/5  Knee extension 3-/5   (Blank rows = not tested)     GAIT: 08/16/2021: Ambulation c SPC into clinic.    08/10/2021 Distance walked: 100'        Assistive device utilized: Environmental consultant - 2 wheeled Level of assistance: Modified independence Comments: decreased stance on LLE and decreased hip/knee flexion on Lt       TODAY'S TREATMENT: 08/23/21 Therex:      Aerobic: Recumbent bike, seat 4 x 8 min; partial to full revolutions     Machines: Leg Press bil 87# 3x10; LLE only 3x10     Seated: LAQ Lt 4# 3x10; 5 sec hold Lt SLR 2x10; 5 sec hold      Supine: AA heel slide x 10 reps     Standing: Lt step ups onto 6" step x 10 reps, intermittent UE support Calf raises x 20 reps    08/16/2021: Therex: Recumbent bike Seat 4 partial to full reverse circles 7 mins Supine heel prop 1 min (cues for home use to improve tightness symptoms c extension)  Runner stretch on incline board Lt leg posterior 30 sec x 3  Supine bridge 3 sec hold x 15  Seated LAQ 2.5 lbs full range with pause in end ranges x 15   Verbal review of existing HEP.    Therapeutic Activities (to aid transfers, ambulation, stairs): Leg press double leg 87 lbs x 15 slow lowering, Lt leg only 50 lbs 2 x 10   Neuro Re-ed:  Church pew anterior/posterior ankle strategy 2 mins  Retro step x 15, performed bilateral   Manual:  Seated Lt knee distraction/IR c flexion mobilization c movements.   Vaso  Supine Lt knee 34 degrees medium compression 10 mins in elevation   08/14/2021: Recumbent bike Seat 4 for 8 minutes Tailgate knee flexion 1 minute L knee AAROM flexion (R pushes L into flexion seated) 10X 10 seconds (2 sets of 5) Quadriceps sets with L heel prop 2 sets of 10 for 5 seconds   Therapeutic Activities: Leg Press with slow eccentrics for sit to stand Double Leg 15X with 87# L leg only 2 sets of 10 50#  Neuromuscular re-education: 3X 20 seconds each eyes open;  head turning; eyes closed   08/10/21: See HEP - performed trial reps PRN for comprehension with mod cues for technique; also discussed need to limit pillow under knee to maximize extension     PATIENT EDUCATION:  08/10/2021 Education details: HEP Person educated: Patient Education method: Consulting civil engineer, Media planner, and Handouts Education comprehension: verbalized understanding, returned demonstration, and needs further education     HOME EXERCISE PROGRAM: 08/10/2021 Access Code: [email or web address] URL: [email or web address] Date: 08/10/2021 Prepared by: Colletta [place]  Zigmund Daniel   Exercises - Quad Set  - 5-10 x daily - 7 x weekly - 1 sets - 5-10 reps - 5 sec hold - Supine Heel Slide with Strap  - 5-10 x daily - 7 x weekly - 1 sets - 5-10 reps - Seated Knee Extension AROM  - 3-5 x daily - 7 x weekly - 1-2 sets - 10 reps - 5 sec hold - Seated Straight Leg Raise  - 3-5 x daily - 7 x weekly - 1-2 sets - 10 reps - 2-3 sec hold - Seated Knee Flexion AAROM  - 3-5 x daily - 7 x weekly - 1-2 sets - 10 reps - 5-10 sec hold   ASSESSMENT:   CLINICAL IMPRESSION: Pt tolerated session well today with AROM now 0-120 meeting ROM goals.  She's progressing really well with PT and anticipate she may d/c early from PT.  Will continue to benefit from PT to maximize function.   OBJECTIVE IMPAIRMENTS Abnormal gait, decreased balance, decreased knowledge of use of DME, decreased mobility, difficulty walking, decreased ROM, decreased strength, increased edema, increased muscle spasms, and pain.    ACTIVITY LIMITATIONS cleaning, community activity, driving, yard work, shopping, and volunteering .    PERSONAL FACTORS 3+ comorbidities: Rt THA, OA, osteopenia  are also affecting patient's functional outcome.      REHAB POTENTIAL: Excellent   CLINICAL DECISION MAKING: Evolving/moderate complexity   EVALUATION COMPLEXITY: Moderate     GOALS: Goals reviewed with patient? Yes   SHORT TERM GOALS: Target  date: 09/07/2021   Independent with initial HEP Goal status: on going - assessed 08/16/2021   2.  Lt knee AROM 0-105 for improved function Goal status: MET 08/23/21   LONG TERM GOALS: Target date: 10/05/2021   Independent with final HEP Goal status: INITIAL   2.  FOTO score improved to 56 Goal status: INITIAL   3.  Lt knee AROM improved to 0-110 for improved function and mobility Goal status: MET 08/23/21   4.  Report pain < 3/10 with activity for improved function Goal status: INITIAL   5.  Amb without AD without significant gait deviations for improved function and mobility. Goal status: INITIAL       PLAN: PT FREQUENCY: 2x/week   PT DURATION: 8 weeks   PLANNED INTERVENTIONS: Therapeutic exercises, Therapeutic activity, Neuromuscular re-education, Balance training, Gait training, Patient/Family education, Joint mobilization, Stair training, DME instructions, Aquatic Therapy, Dry Needling, Electrical stimulation, Cryotherapy, Moist heat, Taping, Vasopneumatic device, and Manual therapy   PLAN FOR NEXT SESSION: focus on strengthening, dynamic balance, maintain motion     Laureen Abrahams, PT, DPT 08/23/21 10:14 AM

## 2021-08-25 ENCOUNTER — Ambulatory Visit: Payer: Medicare Other | Admitting: Rehabilitative and Restorative Service Providers"

## 2021-08-25 ENCOUNTER — Encounter: Payer: Self-pay | Admitting: Rehabilitative and Restorative Service Providers"

## 2021-08-25 DIAGNOSIS — R2689 Other abnormalities of gait and mobility: Secondary | ICD-10-CM | POA: Diagnosis not present

## 2021-08-25 DIAGNOSIS — R600 Localized edema: Secondary | ICD-10-CM | POA: Diagnosis not present

## 2021-08-25 DIAGNOSIS — M25562 Pain in left knee: Secondary | ICD-10-CM | POA: Diagnosis not present

## 2021-08-25 DIAGNOSIS — M6281 Muscle weakness (generalized): Secondary | ICD-10-CM

## 2021-08-25 DIAGNOSIS — M25662 Stiffness of left knee, not elsewhere classified: Secondary | ICD-10-CM

## 2021-08-25 NOTE — Therapy (Signed)
OUTPATIENT PHYSICAL THERAPY TREATMENT NOTE   Patient Name: Allison [name] MRN: [id number] DOB:March 04, 1946, 76 y.o., female 62 Date: 08/25/2021  PCP: Leeroy Cha, MD REFERRING PROVIDER: [name], MD  END OF SESSION:   PT End of Session - 08/25/21 1345     Visit Number 5    Number of Visits 16    Date for PT Re-Evaluation 10/05/21    Authorization Type [employer] Medicare $20 copay    Progress Note Due on Visit 10    PT Start Time 1342    PT Stop Time 3267    PT Time Calculation (min) 49 min    Activity Tolerance Patient tolerated treatment well    Behavior During Therapy WFL for tasks assessed/performed                Past Medical History:  Diagnosis Date   Asthmatic bronchitis    Carpal tunnel syndrome    Diverticulosis    DJD (degenerative joint disease) of hip    s/P R hip replacement   Elevated serum alkaline phosphatase level    Fatty liver disease, nonalcoholic    GERD (gastroesophageal reflux disease)    Hyperlipidemia    Internal hemorrhoids    Obesity    Osteoarthritis    Osteopenia    Post-operative nausea and vomiting    Pre-diabetes    Prediabetes    Thyroid disease    hypo   Vitamin D deficiency    Past Surgical History:  Procedure Laterality Date   CARPAL TUNNEL RELEASE Bilateral    DENTAL SURGERY     JOINT REPLACEMENT  2004   R hip   [place], 2008   2 left and 1 right   TOE FUSION  2000's   TOTAL KNEE ARTHROPLASTY Left 07/21/2021   Procedure: LEFT TOTAL KNEE ARTHROPLASTY;  Surgeon: [name], MD;  Location: [facility];  Service: Orthopedics;  Laterality: Left;   Patient Active Problem List   Diagnosis Date Noted   Status post total left knee replacement 07/22/2021   Status post left knee replacement 07/21/2021   Unilateral primary osteoarthritis, left knee 02/08/2021   Unilateral primary osteoarthritis, left hip 01/11/2021   Low back pain 01/11/2021   Pain in right  shoulder 05/12/2020   Pain in right hip 02/10/2020   Pain in right foot 02/10/2020   Bilateral primary osteoarthritis of knee 12/31/2019   Syncope and collapse 09/08/2019   Chronic pain of right knee 03/28/2016   Unilateral primary osteoarthritis, right knee 03/28/2016   Chondrocalcinosis of right knee 03/28/2016   Acute bronchitis 03/30/2015   HTN (hypertension) 03/04/2013   Osteopenia 03/04/2013   Dyspnea 01/20/2013   Bronchospasm, acute 11/22/2011   Urticaria 11/22/2011   Family history of colon cancer 09/09/2011   Obesity 09/09/2011   Hypokalemia 06/18/2011   Hyperlipidemia    Thyroid disease    Fatty liver disease, nonalcoholic    DJD (degenerative joint disease) of hip     REFERRING DIAG: M17.12 (ICD-10-CM) - Unilateral primary osteoarthritis, left knee Z96.652 (ICD-10-CM) - Status post total left knee replacement   THERAPY DIAG:  Other abnormalities of gait and mobility  Localized edema  Muscle weakness (generalized)  Stiffness of left knee, not elsewhere classified  Acute pain of left knee  PERTINENT HISTORY: Rt THA, OA, osteopenia  PRECAUTIONS: None  SUBJECTIVE:  [name] notes progress with her sleep, although she can still wake up due to soreness and stiffness.  She is managing pain  with tylenol.  [name] reports good HEP compliance.  PAIN:   NPRS scale: 3-4/10 Pain location: Superior and medial L knee Pain description: Can be sharp, sore Aggravating factors: end ranges, twisting Relieving factors: Ice, tylenol   OBJECTIVE: (objective measures completed at initial evaluation unless otherwise dated)   PATIENT SURVEYS:  08/10/2021: FOTO 29 (predicted 56)   EDEMA: 08/10/2021 Joint Line: Lt 18"; Rt 14.75"   LE ROM:   Active ROM Right 08/10/2021 Left 08/10/2021 Left 08/16/2021 Left 08/23/21  Knee flexion 126 98 112 In supine heel slide 120  Knee extension 0 -5  (seated LAQ) -2 Seated LAQ 0 (Seated LAQ)   (Blank rows = not tested)   LE ROM:   Passive  ROM Left 08/10/2021  Knee flexion 105  Knee extension 0   (Blank rows = not tested)   LE MMT:   MMT Left 08/10/2021  Knee flexion 3-/5  Knee extension 3-/5   (Blank rows = not tested)     GAIT: 08/16/2021: Ambulation c SPC into clinic.    08/10/2021 Distance walked: 100'        Assistive device utilized: Environmental consultant - 2 wheeled Level of assistance: Modified independence Comments: decreased stance on LLE and decreased hip/knee flexion on Lt       TODAY'S TREATMENT: 08/25/2021 Recumbent bike Seat 4 for 8 minutes  Therapeutic Activities: Leg Press with slow eccentrics for sit to stand Double Leg 15X with 93# L and R leg only 10X 50#  Step-down off 4 inch step 2 sets of 10 and step-up and over with 4, 6 and 8 inch box  Neuromuscular re-education: 2X 20 seconds each eyes open; head turning; eyes closed (several breaks needed with eyes closed and worked on step strategy)  Vasopneumatic L knee Medium pressure 10 minutes 34*   08/23/21 Therex:      Aerobic: Recumbent bike, seat 4 x 8 min; partial to full revolutions     Machines: Leg Press bil 87# 3x10; LLE only 3x10     Seated: LAQ Lt 4# 3x10; 5 sec hold Lt SLR 2x10; 5 sec hold      Supine: AA heel slide x 10 reps     Standing: Lt step ups onto 6" step x 10 reps, intermittent UE support Calf raises x 20 reps    08/16/2021: Therex: Recumbent bike Seat 4 partial to full reverse circles 7 mins Supine heel prop 1 min (cues for home use to improve tightness symptoms c extension)  Runner stretch on incline board Lt leg posterior 30 sec x 3  Supine bridge 3 sec hold x 15  Seated LAQ 2.5 lbs full range with pause in end ranges x 15   Verbal review of existing HEP.    Therapeutic Activities (to aid transfers, ambulation, stairs): Leg press double leg 87 lbs x 15 slow lowering, Lt leg only 50 lbs 2 x 10   Neuro Re-ed:  Church pew anterior/posterior ankle strategy 2 mins  Retro step x 15, performed bilateral    Manual:  Seated Lt knee distraction/IR c flexion mobilization c movements.   Vaso  Supine Lt knee 34 degrees medium compression 10 mins in elevation    PATIENT EDUCATION:  08/10/2021 Education details: HEP Person educated: Patient Education method: Explanation, Demonstration, and Handouts Education comprehension: verbalized understanding, returned demonstration, and needs further education     HOME EXERCISE PROGRAM: 08/10/2021 Access Code: [email or web address] URL: [email or web address] Date: 08/10/2021 Prepared by: Faustino Congress   Exercises - Quad Set  -  5-10 x daily - 7 x weekly - 1 sets - 5-10 reps - 5 sec hold - Supine Heel Slide with Strap  - 5-10 x daily - 7 x weekly - 1 sets - 5-10 reps - Seated Knee Extension AROM  - 3-5 x daily - 7 x weekly - 1-2 sets - 10 reps - 5 sec hold - Seated Straight Leg Raise  - 3-5 x daily - 7 x weekly - 1-2 sets - 10 reps - 2-3 sec hold - Seated Knee Flexion AAROM  - 3-5 x daily - 7 x weekly - 1-2 sets - 10 reps - 5-10 sec hold   ASSESSMENT:   CLINICAL IMPRESSION: [name] is making great early progress with her post-TKA rehabilitation.  Today's treatment focused on balance, reaction time (step strategy) and quadriceps strengthening with functional challenges.  She is on track for an early DC from supervised PT.  Continue step strategy and strength work to prepare for independent rehabilitation.   OBJECTIVE IMPAIRMENTS Abnormal gait, decreased balance, decreased knowledge of use of DME, decreased mobility, difficulty walking, decreased ROM, decreased strength, increased edema, increased muscle spasms, and pain.    ACTIVITY LIMITATIONS cleaning, community activity, driving, yard work, shopping, and volunteering .    PERSONAL FACTORS 3+ comorbidities: Rt THA, OA, osteopenia  are also affecting patient's functional outcome.      REHAB POTENTIAL: Excellent   CLINICAL DECISION MAKING: Evolving/moderate complexity   EVALUATION  COMPLEXITY: Moderate     GOALS: Goals reviewed with patient? Yes   SHORT TERM GOALS: Target date: 09/07/2021   Independent with initial HEP Goal status: Met - assessed 08/25/2021   2.  Lt knee AROM 0-105 for improved function Goal status: MET 08/23/21   LONG TERM GOALS: Target date: 10/05/2021   Independent with final HEP Goal status: INITIAL   2.  FOTO score improved to 56 Goal status: INITIAL   3.  Lt knee AROM improved to 0-110 for improved function and mobility Goal status: MET 08/23/21   4.  Report pain < 3/10 with activity for improved function Goal status: INITIAL   5.  Amb without AD without significant gait deviations for improved function and mobility. Goal status: INITIAL       PLAN: PT FREQUENCY: 2x/week   PT DURATION: 8 weeks   PLANNED INTERVENTIONS: Therapeutic exercises, Therapeutic activity, Neuromuscular re-education, Balance training, Gait training, Patient/Family education, Joint mobilization, Stair training, DME instructions, Aquatic Therapy, Dry Needling, Electrical stimulation, Cryotherapy, Moist heat, Taping, Vasopneumatic device, and Manual therapy   PLAN FOR NEXT SESSION: Continue focus on quadriceps strengthening, dynamic balance, step strategy and functional work.    Farley Ly PT, MPT 08/25/21 2:28 PM

## 2021-08-29 ENCOUNTER — Encounter: Payer: Medicare Other | Admitting: Physical Therapy

## 2021-08-30 ENCOUNTER — Encounter: Payer: Self-pay | Admitting: Physical Therapy

## 2021-08-30 ENCOUNTER — Ambulatory Visit: Payer: Medicare Other | Admitting: Physical Therapy

## 2021-08-30 DIAGNOSIS — M25662 Stiffness of left knee, not elsewhere classified: Secondary | ICD-10-CM

## 2021-08-30 DIAGNOSIS — M6281 Muscle weakness (generalized): Secondary | ICD-10-CM | POA: Diagnosis not present

## 2021-08-30 DIAGNOSIS — R2689 Other abnormalities of gait and mobility: Secondary | ICD-10-CM | POA: Diagnosis not present

## 2021-08-30 DIAGNOSIS — R600 Localized edema: Secondary | ICD-10-CM

## 2021-08-30 DIAGNOSIS — M25562 Pain in left knee: Secondary | ICD-10-CM | POA: Diagnosis not present

## 2021-08-30 DIAGNOSIS — R2681 Unsteadiness on feet: Secondary | ICD-10-CM | POA: Diagnosis not present

## 2021-08-30 NOTE — Therapy (Signed)
OUTPATIENT PHYSICAL THERAPY TREATMENT NOTE   Patient Name: Allison [name] MRN: [id number] DOB:[birth year], 76 y.o., female Today's Date: 08/30/2021  PCP: Lorenda Ishihara, MD REFERRING PROVIDER: Kathryne Hitch, MD  END OF SESSION:   PT End of Session - 08/30/21 1015     Visit Number 6    Number of Visits 16    Date for PT Re-Evaluation 10/05/21    Authorization Type [employer] Medicare $20 copay    Progress Note Due on Visit 10    PT Start Time 1011    PT Stop Time 1100    PT Time Calculation (min) 49 min    Activity Tolerance Patient tolerated treatment well    Behavior During Therapy WFL for tasks assessed/performed                 Past Medical History:  Diagnosis Date   Asthmatic bronchitis    Carpal tunnel syndrome    Diverticulosis    DJD (degenerative joint disease) of hip    s/P R hip replacement   Elevated serum alkaline phosphatase level    Fatty liver disease, nonalcoholic    GERD (gastroesophageal reflux disease)    Hyperlipidemia    Internal hemorrhoids    Obesity    Osteoarthritis    Osteopenia    Post-operative nausea and vomiting    Pre-diabetes    Prediabetes    Thyroid disease    hypo   Vitamin D deficiency    Past Surgical History:  Procedure Laterality Date   CARPAL TUNNEL RELEASE Bilateral    DENTAL SURGERY     JOINT REPLACEMENT  2004   R hip   ROTATOR CUFF REPAIR  1996, 1998, 2008   2 left and 1 right   TOE FUSION  2000's   TOTAL KNEE ARTHROPLASTY Left 07/21/2021   Procedure: LEFT TOTAL KNEE ARTHROPLASTY;  Surgeon: Kathryne Hitch, MD;  Location: [facility];  Service: Orthopedics;  Laterality: Left;   Patient Active Problem List   Diagnosis Date Noted   Status post total left knee replacement 07/22/2021   Status post left knee replacement 07/21/2021   Unilateral primary osteoarthritis, left knee 02/08/2021   Unilateral primary osteoarthritis, left hip 01/11/2021   Low back pain 01/11/2021   Pain in right  shoulder 05/12/2020   Pain in right hip 02/10/2020   Pain in right foot 02/10/2020   Bilateral primary osteoarthritis of knee 12/31/2019   Syncope and collapse 09/08/2019   Chronic pain of right knee 03/28/2016   Unilateral primary osteoarthritis, right knee 03/28/2016   Chondrocalcinosis of right knee 03/28/2016   Acute bronchitis 03/30/2015   HTN (hypertension) 03/04/2013   Osteopenia 03/04/2013   Dyspnea 01/20/2013   Bronchospasm, acute 11/22/2011   Urticaria 11/22/2011   Family history of colon cancer 09/09/2011   Obesity 09/09/2011   Hypokalemia 06/18/2011   Hyperlipidemia    Thyroid disease    Fatty liver disease, nonalcoholic    DJD (degenerative joint disease) of hip     REFERRING DIAG: M17.12 (ICD-10-CM) - Unilateral primary osteoarthritis, left knee Z96.652 (ICD-10-CM) - Status post total left knee replacement   THERAPY DIAG:  Other abnormalities of gait and mobility  Localized edema  Muscle weakness (generalized)  Stiffness of left knee, not elsewhere classified  Acute pain of left knee  Unsteadiness on feet  PERTINENT HISTORY: Rt THA, OA, osteopenia  PRECAUTIONS: None  SUBJECTIVE:  overdid it on Friday and could barely walk on Saturday, feels better now.  PAIN:  NPRS scale: 3-4/10 Pain location: Superior and medial L knee Pain description: Can be sharp, sore Aggravating factors: end ranges, twisting Relieving factors: Ice, tylenol   OBJECTIVE: (objective measures completed at initial evaluation unless otherwise dated)   PATIENT SURVEYS:  08/10/2021: FOTO 29 (predicted 56)   EDEMA: 08/10/2021 Joint Line: Lt 18"; Rt 14.75"   LE ROM:   Active ROM Right 08/10/2021 Left 08/10/2021 Left 08/16/2021 Left 08/23/21 Left 08/30/21  Knee flexion 126 98 112 In supine heel slide 120 117  Knee extension 0 -5  (seated LAQ) -2 Seated LAQ 0 (Seated LAQ) -2 (seated LAQ)   (Blank rows = not tested)   LE ROM:   Passive ROM Left 08/10/2021  Knee flexion  105  Knee extension 0   (Blank rows = not tested)   LE MMT:   MMT Left 08/10/2021  Knee flexion 3-/5  Knee extension 3-/5   (Blank rows = not tested)     GAIT: 08/16/2021: Ambulation c SPC into clinic.    08/10/2021 Distance walked: 100'        Assistive device utilized: Environmental consultant - 2 wheeled Level of assistance: Modified independence Comments: decreased stance on LLE and decreased hip/knee flexion on Lt       TODAY'S TREATMENT: 08/30/21 Therex:      Aerobic: Recumbent bike, seat 4 x 8 min; partial to full revolutions     Machines: Leg Press bil 87# 3x10; LLE only 50# 3x10 Knee extension 5# LLE only 2x10 Hamstring curl 10# 2x10 LLE only    Neuro Re-ed: Tandem stance with intermittent UE support 2x30 sec bil LLE SLS 3x10 sec with light UE support Modalities: Vaso Lt knee x 10 min, mod pressure, 34 deg  08/25/2021 Recumbent bike Seat 4 for 8 minutes  Therapeutic Activities: Leg Press with slow eccentrics for sit to stand Double Leg 15X with 93# L and R leg only 10X 50#  Step-down off 4 inch step 2 sets of 10 and step-up and over with 4, 6 and 8 inch box  Neuromuscular re-education: 2X 20 seconds each eyes open; head turning; eyes closed (several breaks needed with eyes closed and worked on step strategy)  Vasopneumatic L knee Medium pressure 10 minutes 34*   08/23/21 Therex:      Aerobic: Recumbent bike, seat 4 x 8 min; partial to full revolutions     Machines: Leg Press bil 87# 3x10; LLE only 3x10     Seated: LAQ Lt 4# 3x10; 5 sec hold Lt SLR 2x10; 5 sec hold      Supine: AA heel slide x 10 reps     Standing: Lt step ups onto 6" step x 10 reps, intermittent UE support Calf raises x 20 reps     PATIENT EDUCATION:  08/10/2021 Education details: HEP Person educated: Patient Education method: Consulting civil engineer, Media planner, and Handouts Education comprehension: verbalized understanding, returned demonstration, and needs further education     HOME EXERCISE  PROGRAM: 08/10/2021 Access Code: [email or web address] URL: [email or web address] Date: 08/10/2021 Prepared by: Faustino Congress   Exercises - Quad Set  - 5-10 x daily - 7 x weekly - 1 sets - 5-10 reps - 5 sec hold - Supine Heel Slide with Strap  - 5-10 x daily - 7 x weekly - 1 sets - 5-10 reps - Seated Knee Extension AROM  - 3-5 x daily - 7 x weekly - 1-2 sets - 10 reps - 5 sec hold - Seated Straight Leg Raise  - 3-5 x daily - 7  x weekly - 1-2 sets - 10 reps - 2-3 sec hold - Seated Knee Flexion AAROM  - 3-5 x daily - 7 x weekly - 1-2 sets - 10 reps - 5-10 sec hold   ASSESSMENT:   CLINICAL IMPRESSION: Pt tolerated session well today, but does have some mild swelling limitations today but overall doing well.  Anticipate she will be ready for d/c next 1-2 weeks.   OBJECTIVE IMPAIRMENTS Abnormal gait, decreased balance, decreased knowledge of use of DME, decreased mobility, difficulty walking, decreased ROM, decreased strength, increased edema, increased muscle spasms, and pain.    ACTIVITY LIMITATIONS cleaning, community activity, driving, yard work, shopping, and volunteering .    PERSONAL FACTORS 3+ comorbidities: Rt THA, OA, osteopenia  are also affecting patient's functional outcome.      REHAB POTENTIAL: Excellent   CLINICAL DECISION MAKING: Evolving/moderate complexity   EVALUATION COMPLEXITY: Moderate     GOALS: Goals reviewed with patient? Yes   SHORT TERM GOALS: Target date: 09/07/2021   Independent with initial HEP Goal status: Met - assessed 08/25/2021   2.  Lt knee AROM 0-105 for improved function Goal status: MET 08/23/21   LONG TERM GOALS: Target date: 10/05/2021   Independent with final HEP Goal status: INITIAL   2.  FOTO score improved to 56 Goal status: INITIAL   3.  Lt knee AROM improved to 0-110 for improved function and mobility Goal status: MET 08/23/21   4.  Report pain < 3/10 with activity for improved function Goal status: INITIAL   5.  Amb  without AD without significant gait deviations for improved function and mobility. Goal status: INITIAL       PLAN: PT FREQUENCY: 2x/week   PT DURATION: 8 weeks   PLANNED INTERVENTIONS: Therapeutic exercises, Therapeutic activity, Neuromuscular re-education, Balance training, Gait training, Patient/Family education, Joint mobilization, Stair training, DME instructions, Aquatic Therapy, Dry Needling, Electrical stimulation, Cryotherapy, Moist heat, Taping, Vasopneumatic device, and Manual therapy   PLAN FOR NEXT SESSION: Continue focus on quadriceps strengthening, dynamic balance, step strategy and functional work.    Laureen Abrahams, PT, DPT 08/30/21 10:55 AM

## 2021-08-31 ENCOUNTER — Ambulatory Visit (INDEPENDENT_AMBULATORY_CARE_PROVIDER_SITE_OTHER): Payer: Medicare Other | Admitting: Orthopaedic Surgery

## 2021-08-31 ENCOUNTER — Telehealth: Payer: Self-pay | Admitting: *Deleted

## 2021-08-31 ENCOUNTER — Encounter: Payer: Self-pay | Admitting: Orthopaedic Surgery

## 2021-08-31 DIAGNOSIS — Z96652 Presence of left artificial knee joint: Secondary | ICD-10-CM

## 2021-08-31 NOTE — Progress Notes (Signed)
The patient is a 76 year old female who is now 6 weeks status post a left total knee arthroplasty.  She said she is having swelling but has really gotten good range of motion and strength at this standpoint.  She is very pleased and is doing well overall.  She is driving.  On exam her extension is almost full and her flexion is almost full of the left knee.  There is some clicking but it is minimal.  Her quads will get stronger and this will get less.  There is moderate swelling of the knee to be expected.  The knee is ligamentously stable and her incision looks good.  Her calf is soft.  This point she will continue increase her activities as comfort allows.  From my standpoint, I will see her back in 6 months with an AP and lateral of her left knee.  If there are issues before then she knows to let us know.

## 2021-08-31 NOTE — Telephone Encounter (Signed)
Ortho bundle 30 day call completed. °

## 2021-09-04 ENCOUNTER — Telehealth: Payer: Self-pay | Admitting: *Deleted

## 2021-09-04 DIAGNOSIS — L120 Bullous pemphigoid: Secondary | ICD-10-CM | POA: Diagnosis not present

## 2021-09-04 DIAGNOSIS — L218 Other seborrheic dermatitis: Secondary | ICD-10-CM | POA: Diagnosis not present

## 2021-09-04 NOTE — Telephone Encounter (Signed)
There are no issues with patient donating blood next month are there? She called and asked. I can't see where she had a transfusion after surgery. She typically donates every 3 months she states. Thanks.

## 2021-09-04 NOTE — Telephone Encounter (Signed)
Call to patient and updated on Dr. Trevor Mace response.

## 2021-09-05 ENCOUNTER — Encounter: Payer: Medicare Other | Admitting: Rehabilitative and Restorative Service Providers"

## 2021-09-06 ENCOUNTER — Ambulatory Visit: Payer: Medicare Other | Admitting: Physical Therapy

## 2021-09-06 ENCOUNTER — Encounter: Payer: Self-pay | Admitting: Physical Therapy

## 2021-09-06 DIAGNOSIS — R2681 Unsteadiness on feet: Secondary | ICD-10-CM

## 2021-09-06 DIAGNOSIS — R2689 Other abnormalities of gait and mobility: Secondary | ICD-10-CM

## 2021-09-06 DIAGNOSIS — M25662 Stiffness of left knee, not elsewhere classified: Secondary | ICD-10-CM

## 2021-09-06 DIAGNOSIS — R600 Localized edema: Secondary | ICD-10-CM | POA: Diagnosis not present

## 2021-09-06 DIAGNOSIS — M6281 Muscle weakness (generalized): Secondary | ICD-10-CM | POA: Diagnosis not present

## 2021-09-06 DIAGNOSIS — M25562 Pain in left knee: Secondary | ICD-10-CM

## 2021-09-06 NOTE — Therapy (Signed)
OUTPATIENT PHYSICAL THERAPY TREATMENT NOTE   Patient Name: Allison [name] MRN: [id number] DOB:01-13-1946, 76 y.o., female 21 Date: 09/06/2021  PCP: Leeroy Cha, MD REFERRING PROVIDER: [name], MD  END OF SESSION:   PT End of Session - 09/06/21 1258     Visit Number 7    Number of Visits 16    Date for PT Re-Evaluation 10/05/21    Authorization Type [employer] Medicare $20 copay    Progress Note Due on Visit 10    PT Start Time 1259    PT Stop Time 4128    PT Time Calculation (min) 39 min    Activity Tolerance Patient tolerated treatment well    Behavior During Therapy WFL for tasks assessed/performed                  Past Medical History:  Diagnosis Date   Asthmatic bronchitis    Carpal tunnel syndrome    Diverticulosis    DJD (degenerative joint disease) of hip    s/P R hip replacement   Elevated serum alkaline phosphatase level    Fatty liver disease, nonalcoholic    GERD (gastroesophageal reflux disease)    Hyperlipidemia    Internal hemorrhoids    Obesity    Osteoarthritis    Osteopenia    Post-operative nausea and vomiting    Pre-diabetes    Prediabetes    Thyroid disease    hypo   Vitamin D deficiency    Past Surgical History:  Procedure Laterality Date   CARPAL TUNNEL RELEASE Bilateral    DENTAL SURGERY     JOINT REPLACEMENT  2004   R hip   [place], 2008   2 left and 1 right   TOE FUSION  2000's   TOTAL KNEE ARTHROPLASTY Left 07/21/2021   Procedure: LEFT TOTAL KNEE ARTHROPLASTY;  Surgeon: [name], MD;  Location: [facility];  Service: Orthopedics;  Laterality: Left;   Patient Active Problem List   Diagnosis Date Noted   Status post total left knee replacement 07/22/2021   Status post left knee replacement 07/21/2021   Unilateral primary osteoarthritis, left knee 02/08/2021   Unilateral primary osteoarthritis, left hip 01/11/2021   Low back pain 01/11/2021   Pain in right  shoulder 05/12/2020   Pain in right hip 02/10/2020   Pain in right foot 02/10/2020   Bilateral primary osteoarthritis of knee 12/31/2019   Syncope and collapse 09/08/2019   Chronic pain of right knee 03/28/2016   Unilateral primary osteoarthritis, right knee 03/28/2016   Chondrocalcinosis of right knee 03/28/2016   Acute bronchitis 03/30/2015   HTN (hypertension) 03/04/2013   Osteopenia 03/04/2013   Dyspnea 01/20/2013   Bronchospasm, acute 11/22/2011   Urticaria 11/22/2011   Family history of colon cancer 09/09/2011   Obesity 09/09/2011   Hypokalemia 06/18/2011   Hyperlipidemia    Thyroid disease    Fatty liver disease, nonalcoholic    DJD (degenerative joint disease) of hip     REFERRING DIAG: M17.12 (ICD-10-CM) - Unilateral primary osteoarthritis, left knee Z96.652 (ICD-10-CM) - Status post total left knee replacement   THERAPY DIAG:  Other abnormalities of gait and mobility  Localized edema  Muscle weakness (generalized)  Stiffness of left knee, not elsewhere classified  Acute pain of left knee  Unsteadiness on feet  PERTINENT HISTORY: Rt THA, OA, osteopenia  PRECAUTIONS: None  SUBJECTIVE:  MD was very pleased with progress   PAIN:   NPRS scale: 1/10 Pain location:  Superior and medial L knee Pain description: Can be sharp, sore Aggravating factors: end ranges, twisting Relieving factors: Ice, tylenol   OBJECTIVE: (objective measures completed at initial evaluation unless otherwise dated)   PATIENT SURVEYS:  08/10/2021: FOTO 29 (predicted 56) 09/06/21: FOTO 63   EDEMA: 08/10/2021 Joint Line: Lt 18"; Rt 14.75"   LE ROM:   Active ROM Right 08/10/2021 Left 08/10/2021 Left 08/16/2021 Left 08/23/21 Left 08/30/21  Knee flexion 126 98 112 In supine heel slide 120 117  Knee extension 0 -5  (seated LAQ) -2 Seated LAQ 0 (Seated LAQ) -2 (seated LAQ)   (Blank rows = not tested)   LE ROM:   Passive ROM Left 08/10/2021  Knee flexion 105  Knee extension 0    (Blank rows = not tested)   LE MMT:   MMT Left 08/10/2021  Knee flexion 3-/5  Knee extension 3-/5   (Blank rows = not tested)     GAIT: 08/16/2021: Ambulation c SPC into clinic.    08/10/2021 Distance walked: 100'        Assistive device utilized: Environmental consultant - 2 wheeled Level of assistance: Modified independence Comments: decreased stance on LLE and decreased hip/knee flexion on Lt       TODAY'S TREATMENT: 09/06/21 Therex:      Aerobic: Recumbent bike; seat 4, L4 x 8 min     [place]: Leg Press bil 93# 3x10; LLE only 56# 3x10 Knee extension LLE only 5# 3x10 Hamstring curl LLE only 10# 3x10     Standing: RDL 15# KB 3x10   08/30/21 Therex:      Aerobic: Recumbent bike, seat 4 x 8 min; partial to full revolutions     Machines: Leg Press bil 87# 3x10; LLE only 50# 3x10 Knee extension 5# LLE only 2x10 Hamstring curl 10# 2x10 LLE only    Neuro Re-ed: Tandem stance with intermittent UE support 2x30 sec bil LLE SLS 3x10 sec with light UE support Modalities: Vaso Lt knee x 10 min, mod pressure, 34 deg  08/25/2021 Recumbent bike Seat 4 for 8 minutes  Therapeutic Activities: Leg Press with slow eccentrics for sit to stand Double Leg 15X with 93# L and R leg only 10X 50#  Step-down off 4 inch step 2 sets of 10 and step-up and over with 4, 6 and 8 inch box  Neuromuscular re-education: 2X 20 seconds each eyes open; head turning; eyes closed (several breaks needed with eyes closed and worked on step strategy)  Vasopneumatic L knee Medium pressure 10 minutes 34*   08/23/21 Therex:      Aerobic: Recumbent bike, seat 4 x 8 min; partial to full revolutions     Machines: Leg Press bil 87# 3x10; LLE only 3x10     Seated: LAQ Lt 4# 3x10; 5 sec hold Lt SLR 2x10; 5 sec hold      Supine: AA heel slide x 10 reps     Standing: Lt step ups onto 6" step x 10 reps, intermittent UE support Calf raises x 20 reps     PATIENT EDUCATION:  08/10/2021 Education details: HEP Person  educated: Patient Education method: Consulting civil engineer, Media planner, and Handouts Education comprehension: verbalized understanding, returned demonstration, and needs further education     HOME EXERCISE PROGRAM:  Access Code: [email or web address] URL: [email or web address] Date: 09/06/2021 Prepared by: Faustino Congress  Exercises - Quad Set  - 5-10 x daily - 7 x weekly - 1 sets - 5-10 reps - 5 sec hold - Supine Heel Slide with Strap  -  5-10 x daily - 7 x weekly - 1 sets - 5-10 reps - Seated Knee Extension AROM  - 3-5 x daily - 7 x weekly - 1-2 sets - 10 reps - 5 sec hold - Seated Straight Leg Raise  - 3-5 x daily - 7 x weekly - 1-2 sets - 10 reps - 2-3 sec hold - Seated Knee Flexion AAROM  - 3-5 x daily - 7 x weekly - 1-2 sets - 10 reps - 5-10 sec hold - Full Leg Press  - 1 x daily - 7 x weekly - 3 sets - 10 reps - Single Leg Press  - 1 x daily - 7 x weekly - 3 sets - 10 reps - Single Leg Knee Extension with Weight Machine  - 1 x daily - 7 x weekly - 3 sets - 10 reps - Single Leg Hamstring Curl with Weight Machine  - 1 x daily - 7 x weekly - 3 sets - 10 reps - Hip Abduction Machine  - 1 x daily - 7 x weekly - 3 sets - 10 reps - Hip Adduction Machine  - 1 x daily - 7 x weekly - 3 sets - 10 reps - Kettlebell Deadlift  - 1 x daily - 7 x weekly - 3 sets - 10 reps  ASSESSMENT:   CLINICAL IMPRESSION: Session today focused on issuing gym HEP today, and anticipate she is nearing d/c.  Plan to give updated home HEP next visit.  FOTO goal and amb goals met today.   OBJECTIVE IMPAIRMENTS Abnormal gait, decreased balance, decreased knowledge of use of DME, decreased mobility, difficulty walking, decreased ROM, decreased strength, increased edema, increased muscle spasms, and pain.    ACTIVITY LIMITATIONS cleaning, community activity, driving, yard work, shopping, and volunteering .    PERSONAL FACTORS 3+ comorbidities: Rt THA, OA, osteopenia  are also affecting patient's functional outcome.       REHAB POTENTIAL: Excellent   CLINICAL DECISION MAKING: Evolving/moderate complexity   EVALUATION COMPLEXITY: Moderate     GOALS: Goals reviewed with patient? Yes   SHORT TERM GOALS: Target date: 09/07/2021   Independent with initial HEP Goal status: Met - assessed 08/25/2021   2.  Lt knee AROM 0-105 for improved function Goal status: MET 08/23/21   LONG TERM GOALS: Target date: 10/05/2021   Independent with final HEP Goal status: INITIAL   2.  FOTO score improved to 56 Goal status: MET 09/06/21   3.  Lt knee AROM improved to 0-110 for improved function and mobility Goal status: MET 08/23/21   4.  Report pain < 3/10 with activity for improved function Goal status: INITIAL   5.  Amb without AD without significant gait deviations for improved function and mobility. Goal status: MET 09/06/21       PLAN: PT FREQUENCY: 2x/week   PT DURATION: 8 weeks   PLANNED INTERVENTIONS: Therapeutic exercises, Therapeutic activity, Neuromuscular re-education, Balance training, Gait training, Patient/Family education, Joint mobilization, Stair training, DME instructions, Aquatic Therapy, Dry Needling, Electrical stimulation, Cryotherapy, Moist heat, Taping, Vasopneumatic device, and Manual therapy   PLAN FOR NEXT SESSION: update HEP for balance and strengthening at home    Laureen Abrahams, PT, DPT 09/06/21 1:43 PM

## 2021-09-07 ENCOUNTER — Encounter: Payer: Medicare Other | Admitting: Physical Therapy

## 2021-09-08 ENCOUNTER — Encounter: Payer: Self-pay | Admitting: Physical Therapy

## 2021-09-08 ENCOUNTER — Ambulatory Visit: Payer: Medicare Other | Admitting: Physical Therapy

## 2021-09-08 DIAGNOSIS — R600 Localized edema: Secondary | ICD-10-CM

## 2021-09-08 DIAGNOSIS — M6281 Muscle weakness (generalized): Secondary | ICD-10-CM

## 2021-09-08 DIAGNOSIS — M25662 Stiffness of left knee, not elsewhere classified: Secondary | ICD-10-CM

## 2021-09-08 DIAGNOSIS — R2681 Unsteadiness on feet: Secondary | ICD-10-CM | POA: Diagnosis not present

## 2021-09-08 DIAGNOSIS — R2689 Other abnormalities of gait and mobility: Secondary | ICD-10-CM

## 2021-09-08 DIAGNOSIS — M25562 Pain in left knee: Secondary | ICD-10-CM | POA: Diagnosis not present

## 2021-09-08 NOTE — Therapy (Signed)
OUTPATIENT PHYSICAL THERAPY TREATMENT NOTE   Patient Name: Allison [name] MRN: [id number] DOB:[birth year], 76 y.o., female Today's Date: 09/08/2021  PCP: Lorenda Ishihara, MD REFERRING PROVIDER: Kathryne Hitch, MD  END OF SESSION:   PT End of Session - 09/08/21 0923     Visit Number 8    Number of Visits 16    Date for PT Re-Evaluation 10/05/21    Authorization Type [employer] Medicare $20 copay    Progress Note Due on Visit 10    PT Start Time 0922    PT Stop Time 1000    PT Time Calculation (min) 38 min    Activity Tolerance Patient tolerated treatment well    Behavior During Therapy WFL for tasks assessed/performed                   Past Medical History:  Diagnosis Date   Asthmatic bronchitis    Carpal tunnel syndrome    Diverticulosis    DJD (degenerative joint disease) of hip    s/P R hip replacement   Elevated serum alkaline phosphatase level    Fatty liver disease, nonalcoholic    GERD (gastroesophageal reflux disease)    Hyperlipidemia    Internal hemorrhoids    Obesity    Osteoarthritis    Osteopenia    Post-operative nausea and vomiting    Pre-diabetes    Prediabetes    Thyroid disease    hypo   Vitamin D deficiency    Past Surgical History:  Procedure Laterality Date   CARPAL TUNNEL RELEASE Bilateral    DENTAL SURGERY     JOINT REPLACEMENT  2004   R hip   ROTATOR CUFF REPAIR  1996, 1998, 2008   2 left and 1 right   TOE FUSION  2000's   TOTAL KNEE ARTHROPLASTY Left 07/21/2021   Procedure: LEFT TOTAL KNEE ARTHROPLASTY;  Surgeon: Kathryne Hitch, MD;  Location: [facility];  Service: Orthopedics;  Laterality: Left;   Patient Active Problem List   Diagnosis Date Noted   Status post total left knee replacement 07/22/2021   Status post left knee replacement 07/21/2021   Unilateral primary osteoarthritis, left knee 02/08/2021   Unilateral primary osteoarthritis, left hip 01/11/2021   Low back pain 01/11/2021   Pain in right  shoulder 05/12/2020   Pain in right hip 02/10/2020   Pain in right foot 02/10/2020   Bilateral primary osteoarthritis of knee 12/31/2019   Syncope and collapse 09/08/2019   Chronic pain of right knee 03/28/2016   Unilateral primary osteoarthritis, right knee 03/28/2016   Chondrocalcinosis of right knee 03/28/2016   Acute bronchitis 03/30/2015   HTN (hypertension) 03/04/2013   Osteopenia 03/04/2013   Dyspnea 01/20/2013   Bronchospasm, acute 11/22/2011   Urticaria 11/22/2011   Family history of colon cancer 09/09/2011   Obesity 09/09/2011   Hypokalemia 06/18/2011   Hyperlipidemia    Thyroid disease    Fatty liver disease, nonalcoholic    DJD (degenerative joint disease) of hip     REFERRING DIAG: M17.12 (ICD-10-CM) - Unilateral primary osteoarthritis, left knee Z96.652 (ICD-10-CM) - Status post total left knee replacement   THERAPY DIAG:  Other abnormalities of gait and mobility  Localized edema  Muscle weakness (generalized)  Stiffness of left knee, not elsewhere classified  Acute pain of left knee  Unsteadiness on feet  PERTINENT HISTORY: Rt THA, OA, osteopenia  PRECAUTIONS: None  SUBJECTIVE:  Doing well; [facility]  PAIN:   NPRS scale: 1/10 Pain location: Superior and medial L knee Pain description: Can be sharp, sore Aggravating factors: end ranges, twisting Relieving factors: Ice, tylenol   OBJECTIVE: (objective measures completed at initial evaluation unless otherwise dated)   PATIENT SURVEYS:  08/10/2021: FOTO 29 (predicted 56) 09/06/21: FOTO 63   EDEMA: 08/10/2021 Joint Line: Lt 18"; Rt 14.75"   LE ROM:   Active ROM Right 08/10/2021 Left 08/10/2021 Left 08/16/2021 Left 08/23/21 Left 08/30/21 Left 09/08/21  Knee flexion 126 98 112 In supine heel slide 120 117 123  Knee extension 0 -5  (seated LAQ) -2 Seated LAQ 0 (Seated LAQ) -2 (seated LAQ) 0  (seated LAQ)   (Blank rows = not tested)   LE ROM:   Passive ROM  Left 08/10/2021  Knee flexion 105  Knee extension 0   (Blank rows = not tested)   LE MMT:   MMT Left 08/10/2021  Knee flexion 3-/5  Knee extension 3-/5   (Blank rows = not tested)     GAIT: 08/16/2021: Ambulation c SPC into clinic.    08/10/2021 Distance walked: 100'        Assistive device utilized: Environmental consultant - 2 wheeled Level of assistance: Modified independence Comments: decreased stance on LLE and decreased hip/knee flexion on Lt       TODAY'S TREATMENT: 09/08/21 Therex:      Aerobic: Recumbent bike L5 x 8 min; seat 4     Standing: Forward and lateral step ups on 6" step with intermittent UE support x 10 reps bil RDL with 10# KB 2x10- min cues for technique Squats 2x10 - min cues for technique Lateral lunge x 10 reps bil     Machines: Leg Press bil 93# 3x10; then LLE only 56# 3x10  Removed 2 stitches that were coming out of incision     09/06/21 Therex:      Aerobic: Recumbent bike; seat 4, L4 x 8 min     Macines: Leg Press bil 93# 3x10; LLE only 56# 3x10 Knee extension LLE only 5# 3x10 Hamstring curl LLE only 10# 3x10     Standing: RDL 15# KB 3x10   08/30/21 Therex:      Aerobic: Recumbent bike, seat 4 x 8 min; partial to full revolutions     Machines: Leg Press bil 87# 3x10; LLE only 50# 3x10 Knee extension 5# LLE only 2x10 Hamstring curl 10# 2x10 LLE only    Neuro Re-ed: Tandem stance with intermittent UE support 2x30 sec bil LLE SLS 3x10 sec with light UE support Modalities: Vaso Lt knee x 10 min, mod pressure, 34 deg    PATIENT EDUCATION:  08/10/2021 Education details: HEP Person educated: Patient Education method: Consulting civil engineer, Media planner, and Handouts Education comprehension: verbalized understanding, returned demonstration, and needs further education     HOME EXERCISE PROGRAM:  Access Code: [email or web address] URL: [email or web address] Date: 09/08/2021 Prepared by: Faustino Congress  Exercises - Seated Straight Leg Raise  -  3-5 x daily - 7 x weekly - 1-2 sets - 10 reps - 2-3 sec hold - Full Leg Press  - 1 x daily - 7 x weekly - 3 sets - 10 reps - Single Leg Press  - 1 x daily - 7 x weekly - 3 sets - 10 reps - Single Leg Knee Extension with Weight Machine  - 1 x daily - 7 x weekly - 3 sets - 10 reps - Single Leg Hamstring Curl with Weight Machine  - 1 x daily - 7  x weekly - 3 sets - 10 reps - Hip Abduction Machine  - 1 x daily - 7 x weekly - 3 sets - 10 reps - Hip Adduction Machine  - 1 x daily - 7 x weekly - 3 sets - 10 reps - Kettlebell Deadlift  - 1 x daily - 7 x weekly - 3 sets - 10 reps - Forward Step Up with Unilateral Counter Support  - 1 x daily - 7 x weekly - 3 sets - 10 reps - Lateral Step Up with Counter Support  - 1 x daily - 7 x weekly - 3 sets - 10 reps - Mini Squat with Counter Support  - 1 x daily - 7 x weekly - 3 sets - 10 reps - Lateral Lunge  - 1 x daily - 7 x weekly - 3 sets - 10 reps - Sit to Stand  - 2 x daily - 7 x weekly - 1 sets - 10 reps  ASSESSMENT:   CLINICAL IMPRESSION: Provided HEP today to progress home exercises, and also has gym program.  AROM is excellent at this time and plan for d/c from PT next week.   OBJECTIVE IMPAIRMENTS Abnormal gait, decreased balance, decreased knowledge of use of DME, decreased mobility, difficulty walking, decreased ROM, decreased strength, increased edema, increased muscle spasms, and pain.    ACTIVITY LIMITATIONS cleaning, community activity, driving, yard work, shopping, and volunteering .    PERSONAL FACTORS 3+ comorbidities: Rt THA, OA, osteopenia  are also affecting patient's functional outcome.      REHAB POTENTIAL: Excellent   CLINICAL DECISION MAKING: Evolving/moderate complexity   EVALUATION COMPLEXITY: Moderate     GOALS: Goals reviewed with patient? Yes   SHORT TERM GOALS: Target date: 09/07/2021   Independent with initial HEP Goal status: Met - assessed 08/25/2021   2.  Lt knee AROM 0-105 for improved function Goal status:  MET 08/23/21   LONG TERM GOALS: Target date: 10/05/2021   Independent with final HEP Goal status: INITIAL   2.  FOTO score improved to 56 Goal status: MET 09/06/21   3.  Lt knee AROM improved to 0-110 for improved function and mobility Goal status: MET 08/23/21   4.  Report pain < 3/10 with activity for improved function Goal status: INITIAL   5.  Amb without AD without significant gait deviations for improved function and mobility. Goal status: MET 09/06/21       PLAN: PT FREQUENCY: 2x/week   PT DURATION: 8 weeks   PLANNED INTERVENTIONS: Therapeutic exercises, Therapeutic activity, Neuromuscular re-education, Balance training, Gait training, Patient/Family education, Joint mobilization, Stair training, DME instructions, Aquatic Therapy, Dry Needling, Electrical stimulation, Cryotherapy, Moist heat, Taping, Vasopneumatic device, and Manual therapy   PLAN FOR NEXT SESSION: check remaining goals, plan for d/c   Laureen Abrahams, PT, DPT 09/08/21 10:03 AM

## 2021-09-12 ENCOUNTER — Ambulatory Visit: Payer: Medicare Other | Admitting: Physical Therapy

## 2021-09-12 ENCOUNTER — Encounter: Payer: Self-pay | Admitting: Physical Therapy

## 2021-09-12 DIAGNOSIS — R600 Localized edema: Secondary | ICD-10-CM

## 2021-09-12 DIAGNOSIS — M6281 Muscle weakness (generalized): Secondary | ICD-10-CM

## 2021-09-12 DIAGNOSIS — M25562 Pain in left knee: Secondary | ICD-10-CM | POA: Diagnosis not present

## 2021-09-12 DIAGNOSIS — M25662 Stiffness of left knee, not elsewhere classified: Secondary | ICD-10-CM | POA: Diagnosis not present

## 2021-09-12 DIAGNOSIS — R2689 Other abnormalities of gait and mobility: Secondary | ICD-10-CM

## 2021-09-12 DIAGNOSIS — R2681 Unsteadiness on feet: Secondary | ICD-10-CM | POA: Diagnosis not present

## 2021-09-12 NOTE — Therapy (Signed)
OUTPATIENT PHYSICAL THERAPY TREATMENT NOTE DISCHARGE SUMMARY   Patient Name: Allison [name] MRN: [id number] DOB:February 20, 1946, 76 y.o., female Today's Date: 09/12/2021  PCP: Lorenda Ishihara, MD REFERRING PROVIDER: Kathryne Hitch, MD  END OF SESSION:   PT End of Session - 09/12/21 1001     Visit Number 9    Number of Visits 16    Date for PT Re-Evaluation 10/05/21    Authorization Type [employer] Medicare $20 copay    Progress Note Due on Visit 10    PT Start Time 0930    PT Stop Time 1000    PT Time Calculation (min) 30 min    Activity Tolerance Patient tolerated treatment well    Behavior During Therapy WFL for tasks assessed/performed                    Past Medical History:  Diagnosis Date   Asthmatic bronchitis    Carpal tunnel syndrome    Diverticulosis    DJD (degenerative joint disease) of hip    s/P R hip replacement   Elevated serum alkaline phosphatase level    Fatty liver disease, nonalcoholic    GERD (gastroesophageal reflux disease)    Hyperlipidemia    Internal hemorrhoids    Obesity    Osteoarthritis    Osteopenia    Post-operative nausea and vomiting    Pre-diabetes    Prediabetes    Thyroid disease    hypo   Vitamin D deficiency    Past Surgical History:  Procedure Laterality Date   CARPAL TUNNEL RELEASE Bilateral    DENTAL SURGERY     JOINT REPLACEMENT  2004   R hip   ROTATOR CUFF REPAIR  1996, 1998, 2008   2 left and 1 right   TOE FUSION  2000's   TOTAL KNEE ARTHROPLASTY Left 07/21/2021   Procedure: LEFT TOTAL KNEE ARTHROPLASTY;  Surgeon: Kathryne Hitch, MD;  Location: [facility];  Service: Orthopedics;  Laterality: Left;   Patient Active Problem List   Diagnosis Date Noted   Status post total left knee replacement 07/22/2021   Status post left knee replacement 07/21/2021   Unilateral primary osteoarthritis, left knee 02/08/2021   Unilateral primary osteoarthritis, left hip 01/11/2021   Low back pain  01/11/2021   Pain in right shoulder 05/12/2020   Pain in right hip 02/10/2020   Pain in right foot 02/10/2020   Bilateral primary osteoarthritis of knee 12/31/2019   Syncope and collapse 09/08/2019   Chronic pain of right knee 03/28/2016   Unilateral primary osteoarthritis, right knee 03/28/2016   Chondrocalcinosis of right knee 03/28/2016   Acute bronchitis 03/30/2015   HTN (hypertension) 03/04/2013   Osteopenia 03/04/2013   Dyspnea 01/20/2013   Bronchospasm, acute 11/22/2011   Urticaria 11/22/2011   Family history of colon cancer 09/09/2011   Obesity 09/09/2011   Hypokalemia 06/18/2011   Hyperlipidemia    Thyroid disease    Fatty liver disease, nonalcoholic    DJD (degenerative joint disease) of hip     REFERRING DIAG: M17.12 (ICD-10-CM) - Unilateral primary osteoarthritis, left knee Z96.652 (ICD-10-CM) - Status post total left knee replacement   THERAPY DIAG:  Other abnormalities of gait and mobility  Localized edema  Muscle weakness (generalized)  Stiffness of left knee, not elsewhere classified  Acute pain of left knee  Unsteadiness on feet  PERTINENT HISTORY: Rt THA, OA, osteopenia  PRECAUTIONS: None  SUBJECTIVE[facility] - gets [facility] to learn equipment  PAIN:   NPRS scale: 0/10 ("tightness") Pain location: Superior and medial L knee Pain description: Can be sharp, sore Aggravating factors: end ranges, twisting Relieving factors: Ice, tylenol   OBJECTIVE: (objective measures completed at initial evaluation unless otherwise dated)   PATIENT SURVEYS:  08/10/2021: FOTO 29 (predicted 56) 09/06/21: FOTO 63   EDEMA: 08/10/2021 Joint Line: Lt 18"; Rt 14.75"   LE ROM:   Active ROM Right 08/10/2021 Left 08/10/2021 Left 08/16/2021 Left 08/23/21 Left 08/30/21 Left 09/08/21  Knee flexion 126 98 112 In supine heel slide 120 117 123  Knee extension 0 -5  (seated LAQ) -2 Seated LAQ 0 (Seated LAQ) -2 (seated LAQ) 0  (seated LAQ)   (Blank rows = not  tested)   LE ROM:   Passive ROM Left 08/10/2021  Knee flexion 105  Knee extension 0   (Blank rows = not tested)   LE MMT:   MMT Left 08/10/2021  Knee flexion 3-/5  Knee extension 3-/5   (Blank rows = not tested)     GAIT: 08/16/2021: Ambulation c SPC into clinic.    08/10/2021 Distance walked: 100'        Assistive device utilized: Environmental consultant - 2 wheeled Level of assistance: Modified independence Comments: decreased stance on LLE and decreased hip/knee flexion on Lt       TODAY'S TREATMENT: 09/12/21 Recumbent bike L5 x 8 min; seat 4 Leg Press 100# 3x10 bil; 56# 3x10 LLE only Several trial SLS up to 6 sec on Lt, and 3 sec on Rt Discussed compliant surface balance work, as well as SLS and current HEP.  Pt verbalized understanding.  09/08/21 Therex:      Aerobic: Recumbent bike L5 x 8 min; seat 4     Standing: Forward and lateral step ups on 6" step with intermittent UE support x 10 reps bil RDL with 10# KB 2x10- min cues for technique Squats 2x10 - min cues for technique Lateral lunge x 10 reps bil     Machines: Leg Press bil 93# 3x10; then LLE only 56# 3x10  Removed 2 stitches that were coming out of incision     09/06/21 Therex:      Aerobic: Recumbent bike; seat 4, L4 x 8 min     Macines: Leg Press bil 93# 3x10; LLE only 56# 3x10 Knee extension LLE only 5# 3x10 Hamstring curl LLE only 10# 3x10     Standing: RDL 15# KB 3x10   08/30/21 Therex:      Aerobic: Recumbent bike, seat 4 x 8 min; partial to full revolutions     Machines: Leg Press bil 87# 3x10; LLE only 50# 3x10 Knee extension 5# LLE only 2x10 Hamstring curl 10# 2x10 LLE only    Neuro Re-ed: Tandem stance with intermittent UE support 2x30 sec bil LLE SLS 3x10 sec with light UE support Modalities: Vaso Lt knee x 10 min, mod pressure, 34 deg    PATIENT EDUCATION:  08/10/2021 Education details: HEP Person educated: Patient Education method: Consulting civil engineer, Media planner, and Handouts Education  comprehension: verbalized understanding, returned demonstration, and needs further education     HOME EXERCISE PROGRAM:  Access Code: [email or web address] URL: [email or web address] Date: 09/08/2021 Prepared by: Faustino Congress  Exercises - Seated Straight Leg Raise  - 3-5 x daily - 7 x weekly - 1-2 sets - 10 reps - 2-3 sec hold - Full Leg Press  - 1 x daily - 7 x weekly - 3 sets - 10 reps - Single Leg Press  - 1  x daily - 7 x weekly - 3 sets - 10 reps - Single Leg Knee Extension with Weight Machine  - 1 x daily - 7 x weekly - 3 sets - 10 reps - Single Leg Hamstring Curl with Weight Machine  - 1 x daily - 7 x weekly - 3 sets - 10 reps - Hip Abduction Machine  - 1 x daily - 7 x weekly - 3 sets - 10 reps - Hip Adduction Machine  - 1 x daily - 7 x weekly - 3 sets - 10 reps - Kettlebell Deadlift  - 1 x daily - 7 x weekly - 3 sets - 10 reps - Forward Step Up with Unilateral Counter Support  - 1 x daily - 7 x weekly - 3 sets - 10 reps - Lateral Step Up with Counter Support  - 1 x daily - 7 x weekly - 3 sets - 10 reps - Mini Squat with Counter Support  - 1 x daily - 7 x weekly - 3 sets - 10 reps - Lateral Lunge  - 1 x daily - 7 x weekly - 3 sets - 10 reps - Sit to Stand  - 2 x daily - 7 x weekly - 1 sets - 10 reps  ASSESSMENT:   CLINICAL IMPRESSION: Pt has met all goals at this time and is ready for d/c.  Will d/c PT at this time.   OBJECTIVE IMPAIRMENTS Abnormal gait, decreased balance, decreased knowledge of use of DME, decreased mobility, difficulty walking, decreased ROM, decreased strength, increased edema, increased muscle spasms, and pain.    ACTIVITY LIMITATIONS cleaning, community activity, driving, yard work, shopping, and volunteering .    PERSONAL FACTORS 3+ comorbidities: Rt THA, OA, osteopenia  are also affecting patient's functional outcome.      REHAB POTENTIAL: Excellent   CLINICAL DECISION MAKING: Evolving/moderate complexity   EVALUATION COMPLEXITY: Moderate      GOALS: Goals reviewed with patient? Yes   SHORT TERM GOALS: Target date: 09/07/2021   Independent with initial HEP Goal status: Met - assessed 08/25/2021   2.  Lt knee AROM 0-105 for improved function Goal status: MET 08/23/21   LONG TERM GOALS: Target date: 10/05/2021   Independent with final HEP Goal status: MET 09/12/21   2.  FOTO score improved to 56 Goal status: MET 09/06/21   3.  Lt knee AROM improved to 0-110 for improved function and mobility Goal status: MET 08/23/21   4.  Report pain < 3/10 with activity for improved function Goal status: MET 09/12/21   5.  Amb without AD without significant gait deviations for improved function and mobility. Goal status: MET 09/06/21       PLAN: PT FREQUENCY: 2x/week   PT DURATION: 8 weeks   PLANNED INTERVENTIONS: Therapeutic exercises, Therapeutic activity, Neuromuscular re-education, Balance training, Gait training, Patient/Family education, Joint mobilization, Stair training, DME instructions, Aquatic Therapy, Dry Needling, Electrical stimulation, Cryotherapy, Moist heat, Taping, Vasopneumatic device, and Manual therapy   PLAN FOR NEXT SESSION: d/c PT today   Laureen Abrahams, PT, DPT 09/12/21 10:02 AM      PHYSICAL THERAPY DISCHARGE SUMMARY  Visits from Start of Care: 9  Current functional level related to goals / functional outcomes: See above   Remaining deficits: See above   Education / Equipment: HEP   Patient agrees to discharge. Patient goals were met. Patient is being discharged due to meeting the stated rehab goals.   Laureen Abrahams, PT, DPT 09/12/21 10:02  [facility] [address] [place], [place], [address] Phone: [phone]   Fax:  [phone]

## 2021-09-15 ENCOUNTER — Encounter: Payer: Medicare Other | Admitting: Physical Therapy

## 2021-09-19 ENCOUNTER — Encounter: Payer: Medicare Other | Admitting: Physical Therapy

## 2021-09-22 ENCOUNTER — Encounter: Payer: Medicare Other | Admitting: Physical Therapy

## 2021-09-26 ENCOUNTER — Encounter: Payer: Medicare Other | Admitting: Physical Therapy

## 2021-09-27 ENCOUNTER — Telehealth: Payer: Self-pay | Admitting: *Deleted

## 2021-09-27 NOTE — Telephone Encounter (Signed)
Ortho bundle 60 day call completed.

## 2022-01-10 DIAGNOSIS — R059 Cough, unspecified: Secondary | ICD-10-CM | POA: Diagnosis not present

## 2022-01-10 DIAGNOSIS — Z03818 Encounter for observation for suspected exposure to other biological agents ruled out: Secondary | ICD-10-CM | POA: Diagnosis not present

## 2022-01-10 DIAGNOSIS — I10 Essential (primary) hypertension: Secondary | ICD-10-CM | POA: Diagnosis not present

## 2022-01-19 DIAGNOSIS — I10 Essential (primary) hypertension: Secondary | ICD-10-CM | POA: Diagnosis not present

## 2022-02-14 DIAGNOSIS — D508 Other iron deficiency anemias: Secondary | ICD-10-CM | POA: Diagnosis not present

## 2022-02-14 DIAGNOSIS — I10 Essential (primary) hypertension: Secondary | ICD-10-CM | POA: Diagnosis not present

## 2022-02-14 DIAGNOSIS — E7800 Pure hypercholesterolemia, unspecified: Secondary | ICD-10-CM | POA: Diagnosis not present

## 2022-02-20 DIAGNOSIS — D508 Other iron deficiency anemias: Secondary | ICD-10-CM | POA: Diagnosis not present

## 2022-02-20 DIAGNOSIS — Z0000 Encounter for general adult medical examination without abnormal findings: Secondary | ICD-10-CM | POA: Diagnosis not present

## 2022-02-20 DIAGNOSIS — K219 Gastro-esophageal reflux disease without esophagitis: Secondary | ICD-10-CM | POA: Diagnosis not present

## 2022-02-20 DIAGNOSIS — J45909 Unspecified asthma, uncomplicated: Secondary | ICD-10-CM | POA: Diagnosis not present

## 2022-02-20 DIAGNOSIS — M169 Osteoarthritis of hip, unspecified: Secondary | ICD-10-CM | POA: Diagnosis not present

## 2022-02-20 DIAGNOSIS — R7309 Other abnormal glucose: Secondary | ICD-10-CM | POA: Diagnosis not present

## 2022-02-20 DIAGNOSIS — E7800 Pure hypercholesterolemia, unspecified: Secondary | ICD-10-CM | POA: Diagnosis not present

## 2022-02-20 DIAGNOSIS — G5600 Carpal tunnel syndrome, unspecified upper limb: Secondary | ICD-10-CM | POA: Diagnosis not present

## 2022-02-20 DIAGNOSIS — Z96652 Presence of left artificial knee joint: Secondary | ICD-10-CM | POA: Diagnosis not present

## 2022-02-20 DIAGNOSIS — I10 Essential (primary) hypertension: Secondary | ICD-10-CM | POA: Diagnosis not present

## 2022-02-20 DIAGNOSIS — M85859 Other specified disorders of bone density and structure, unspecified thigh: Secondary | ICD-10-CM | POA: Diagnosis not present

## 2022-02-20 DIAGNOSIS — E039 Hypothyroidism, unspecified: Secondary | ICD-10-CM | POA: Diagnosis not present

## 2022-03-05 ENCOUNTER — Ambulatory Visit: Payer: Medicare Other | Admitting: Orthopaedic Surgery

## 2022-03-05 ENCOUNTER — Ambulatory Visit (INDEPENDENT_AMBULATORY_CARE_PROVIDER_SITE_OTHER): Payer: Medicare Other

## 2022-03-05 ENCOUNTER — Encounter: Payer: Self-pay | Admitting: Orthopaedic Surgery

## 2022-03-05 DIAGNOSIS — Z96652 Presence of left artificial knee joint: Secondary | ICD-10-CM | POA: Diagnosis not present

## 2022-03-05 NOTE — Progress Notes (Signed)
The patient is now 7 months status post a left total knee arthroplasty.  She says the knee is doing very well and she has good range of motion and strength and no issues.  On exam her range of motion is entirely full of her left knee and it is ligamentously stable.  2 views of the left knee show well-seated total knee arthroplasty with no complicating features.  She does have some arthritic changes in her right knee but currently it is not bothering her at all.  At this point follow-up for her left knee can be as needed.  If she has any issues at all with that knee she is will let us know.  She is a very active and young appearing 76 year old female.

## 2022-03-12 DIAGNOSIS — L820 Inflamed seborrheic keratosis: Secondary | ICD-10-CM | POA: Diagnosis not present

## 2022-03-12 DIAGNOSIS — L120 Bullous pemphigoid: Secondary | ICD-10-CM | POA: Diagnosis not present

## 2022-03-12 DIAGNOSIS — L570 Actinic keratosis: Secondary | ICD-10-CM | POA: Diagnosis not present

## 2022-03-14 ENCOUNTER — Encounter: Payer: Self-pay | Admitting: Internal Medicine

## 2022-04-10 ENCOUNTER — Ambulatory Visit (AMBULATORY_SURGERY_CENTER): Payer: Medicare HMO | Admitting: *Deleted

## 2022-04-10 VITALS — Ht 64.0 in | Wt 175.0 lb

## 2022-04-10 DIAGNOSIS — Z800 Family history of malignant neoplasm of digestive organs: Secondary | ICD-10-CM

## 2022-04-10 DIAGNOSIS — Z86010 Personal history of colonic polyps: Secondary | ICD-10-CM

## 2022-04-10 MED ORDER — NA SULFATE-K SULFATE-MG SULF 17.5-3.13-1.6 GM/177ML PO SOLN: 1.0000 | Freq: Once | ORAL | 0 refills | Status: AC

## 2022-04-10 NOTE — Progress Notes (Signed)
Pt's previsit is done over the phone and all paperwork (prep instructions) sent to patient.  Pt's name and DOB verified at the beginning of the previsit.  Pt denies any difficulty with ambulating.   No egg or soy allergy known to patient  Pt has hx PONV Patient denies ever being told they had issues or difficulty with intubation  No FH of Malignant Hyperthermia Pt is not on diet pills Pt is not on  home 02  Pt is not on blood thinners  Pt denies issues with constipation  Pt is not on dialysis Pt denies any upcoming cardiac testing Pt encouraged to use to use Singlecare or Goodrx to reduce cost  Patient's chart reviewed by Osvaldo Angst CNRA prior to previsit and patient appropriate for the [facility].  Previsit completed and red dot placed by patient's name on their procedure day (on provider's schedule).

## 2022-04-18 ENCOUNTER — Ambulatory Visit: Payer: Medicare HMO | Admitting: Orthopaedic Surgery

## 2022-04-18 ENCOUNTER — Ambulatory Visit (INDEPENDENT_AMBULATORY_CARE_PROVIDER_SITE_OTHER): Payer: Medicare HMO

## 2022-04-18 DIAGNOSIS — M25551 Pain in right hip: Secondary | ICD-10-CM

## 2022-04-18 NOTE — Progress Notes (Signed)
The patient is well-known to me.  We replaced her left knee in April of last year so has been under a year.  We last saw her for her knee in December and that was doing well.  Allison [name] is 77 years old and 2 weeks ago she fell really hard landing directly on her right hip.  She does have remote history of a hip replacement due to Dr. Durward Fortes that about 20 years ago.  She had severe and significant bruising after that but has been able to weight-bear.  She points to the trochanteric area as a source of her pain.  She is walking without assistive ice.  It has been getting better but she is still quite sore.  She is not on blood thinning medications either.  Her right hip exam is normal in terms of range of motion and no pain in the groin and no blocks to rotation.  There is significant pain to palpation over the trochanteric area of the hip.  An AP pelvis and lateral right hip shows a well-seated total hip arthroplasty.  There are no cortical irregularities to suggest fracture around the right hip.  Again reassurance that this should slowly improve with time that she is likely contused and bruised the bone.  I would not recommend a steroid injection right now given the acute nature of her injury but certainly if she continues to have persistent right hip pain that would be the next step.  She is doing well with her left knee replacement.  All questions and concerns were answered addressed.  Follow-up is as needed.

## 2022-04-30 ENCOUNTER — Encounter: Payer: Self-pay | Admitting: Internal Medicine

## 2022-05-08 ENCOUNTER — Encounter: Payer: Self-pay | Admitting: Internal Medicine

## 2022-05-08 ENCOUNTER — Ambulatory Visit: Payer: Medicare HMO | Admitting: Internal Medicine

## 2022-05-08 VITALS — BP 172/76 | HR 58 | Temp 98.9°F | Resp 17 | Ht 64.0 in | Wt 175.0 lb

## 2022-05-08 DIAGNOSIS — Z86010 Personal history of colonic polyps: Secondary | ICD-10-CM

## 2022-05-08 DIAGNOSIS — Z800 Family history of malignant neoplasm of digestive organs: Secondary | ICD-10-CM

## 2022-05-08 DIAGNOSIS — D122 Benign neoplasm of ascending colon: Secondary | ICD-10-CM | POA: Diagnosis not present

## 2022-05-08 DIAGNOSIS — Z09 Encounter for follow-up examination after completed treatment for conditions other than malignant neoplasm: Secondary | ICD-10-CM | POA: Diagnosis not present

## 2022-05-08 DIAGNOSIS — K635 Polyp of colon: Secondary | ICD-10-CM | POA: Diagnosis not present

## 2022-05-08 MED ORDER — SODIUM CHLORIDE 0.9 % IV SOLN: 500.0000 mL | Freq: Once | INTRAVENOUS | Status: DC

## 2022-05-08 NOTE — Progress Notes (Signed)
Pt's states no medical or surgical changes since previsit or office visit. 

## 2022-05-08 NOTE — Progress Notes (Signed)
HISTORY OF PRESENT ILLNESS:  Allison [name] is a 77 y.o. female with a personal history of adenomatous colon polyps and a family history of colon cancer in multiple first-degree relatives.  Now for surveillance colonoscopy  REVIEW OF SYSTEMS:  All non-GI ROS negative except for  Past Medical History:  Diagnosis Date   Asthmatic bronchitis    Carpal tunnel syndrome    Cataract    Diverticulosis    DJD (degenerative joint disease) of hip    s/P R hip replacement   Elevated serum alkaline phosphatase level    Fatty liver disease, nonalcoholic    GERD (gastroesophageal reflux disease)    HTN (hypertension) 03/04/2013   Hyperlipidemia    Internal hemorrhoids    Obesity    Osteoarthritis    Osteopenia    Post-operative nausea and vomiting    Pre-diabetes    Prediabetes    Thyroid disease    hypo   Vitamin D deficiency     Past Surgical History:  Procedure Laterality Date   CARPAL TUNNEL RELEASE Bilateral    CATARACT EXTRACTION Right    COLONOSCOPY     DENTAL SURGERY     JOINT REPLACEMENT  2004   R hip   [place], 1998, 2008   2 left and 1 right   TOE FUSION  2000's   TOTAL KNEE ARTHROPLASTY Left 07/21/2021   Procedure: LEFT TOTAL KNEE ARTHROPLASTY;  Surgeon: [name], MD;  Location: [facility];  Service: Orthopedics;  Laterality: Left;    Social History Allison [name]  reports that she quit smoking about 39 years ago. Her smoking use included cigarettes. She has a 40.00 pack-year smoking history. She has never used smokeless tobacco. She reports current alcohol use of about 7.0 standard drinks of alcohol per week. She reports that she does not use drugs.  family history includes Colon cancer (age of onset: 54) in her mother; HIV in her brother; Heart disease in her father; Ovarian cancer (age of onset: 3) in her maternal aunt.  No Known Allergies     PHYSICAL EXAMINATION: Vital signs: BP 138/61   Pulse 70   Temp 98.9 F (37.2  C) (Temporal)   Ht '5\' 4"'$  (1.626 m)   Wt 175 lb (79.4 kg)   SpO2 100%   BMI 30.04 kg/m  General: Well-developed, well-nourished, no acute distress HEENT: Sclerae are anicteric, conjunctiva pink. Oral mucosa intact Lungs: Clear Heart: Regular Abdomen: soft, nontender, nondistended, no obvious ascites, no peritoneal signs, normal bowel sounds. No organomegaly. Extremities: No edema Psychiatric: alert and oriented x3. Cooperative     ASSESSMENT:  Personal history of adenomatous polyps and family history of colon cancer   PLAN:  Surveillance colonoscopy

## 2022-05-08 NOTE — Progress Notes (Signed)
To pacu, VSS. Report to [email or web address] 

## 2022-05-08 NOTE — Op Note (Signed)
[facility] Patient Name: Allison [name] Procedure Date: 05/08/2022 9:41 AM MRN: [id number] Endoscopist: Docia Chuck. Henrene Pastor , MD, [id number] Age: 77 Referring MD:  Date of Birth: Nov 22, 1945 Gender: Female Account #: [id number] Procedure:                Colonoscopy with cold snare polypectomy x 2 Indications:              High risk colon cancer surveillance: Personal                            history of non-advanced adenoma. Family history of                            colon cancer. Previous examinations 2009, 2011, 2017 Medicines:                Monitored Anesthesia Care Procedure:                Pre-Anesthesia Assessment:                           - Prior to the procedure, a History and Physical                            was performed, and patient medications and                            allergies were reviewed. The patient's tolerance of                            previous anesthesia was also reviewed. The risks                            and benefits of the procedure and the sedation                            options and risks were discussed with the patient.                            All questions were answered, and informed consent                            was obtained. Prior Anticoagulants: The patient has                            taken no anticoagulant or antiplatelet agents. ASA                            Grade Assessment: II - A patient with mild systemic                            disease. After reviewing the risks and benefits,                            the patient was deemed in satisfactory condition to  undergo the procedure.                           After obtaining informed consent, the colonoscope                            was passed under direct vision. Throughout the                            procedure, the patient's blood pressure, pulse, and                            oxygen saturations were monitored continuously. The                             CF [id number] #[id number] was introduced through the anus                            and advanced to the the cecum, identified by                            appendiceal orifice and ileocecal valve. The                            ileocecal valve, appendiceal orifice, and rectum                            were photographed. The quality of the bowel                            preparation was good. The colonoscopy was performed                            without difficulty. The patient tolerated the                            procedure well. The bowel preparation used was                            SUPREP via split dose instruction. Scope In: 9:49:30 AM Scope Out: 10:07:07 AM Scope Withdrawal Time: 0 hours 14 minutes 1 second  Total Procedure Duration: 0 hours 17 minutes 37 seconds  Findings:                 Two polyps were found in the ascending colon. The                            polyps were 1 to 3 mm in size. These polyps were                            removed with a cold snare. Resection and retrieval                            were complete.  Many diverticula were found in the left colon and                            right colon.                           Internal hemorrhoids were found during retroflexion.                           The exam was otherwise without abnormality on                            direct and retroflexion views. Complications:            No immediate complications. Estimated blood loss:                            None. Estimated Blood Loss:     Estimated blood loss: none. Impression:               - Two 1 to 3 mm polyps in the ascending colon,                            removed with a cold snare. Resected and retrieved.                           - Diverticulosis in the left colon and in the right                            colon.                           - Internal hemorrhoids.                           - The  examination was otherwise normal on direct                            and retroflexion views. Recommendation:           - Repeat colonoscopy is not recommended for                            surveillance.                           - Patient has a contact number available for                            emergencies. The signs and symptoms of potential                            delayed complications were discussed with the                            patient. Return to normal activities tomorrow.  Written discharge instructions were provided to the                            patient.                           - Resume previous diet.                           - Continue present medications.                           - Await pathology results. Docia Chuck. Henrene Pastor, MD 05/08/2022 10:11:52 AM This report has been signed electronically.

## 2022-05-08 NOTE — Progress Notes (Signed)
Called to room to assist during endoscopic procedure.  Patient ID and intended procedure confirmed with present staff. Received instructions for my participation in the procedure from the performing physician.  

## 2022-05-08 NOTE — Patient Instructions (Signed)
   Handout on polyps,diverticulosis ,& hemorrhoids  given to you today  Await pathology results on polyps removed     YOU HAD AN ENDOSCOPIC PROCEDURE TODAY AT [facility]:   Refer to the procedure report that was given to you for any specific questions about what was found during the examination.  If the procedure report does not answer your questions, please call your gastroenterologist to clarify.  If you requested that your care partner not be given the details of your procedure findings, then the procedure report has been included in a sealed envelope for you to review at your convenience later.  YOU SHOULD EXPECT: Some feelings of bloating in the abdomen. Passage of more gas than usual.  Walking can help get rid of the air that was put into your GI tract during the procedure and reduce the bloating. If you had a lower endoscopy (such as a colonoscopy or flexible sigmoidoscopy) you may notice spotting of blood in your stool or on the toilet paper. If you underwent a bowel prep for your procedure, you may not have a normal bowel movement for a few days.  Please Note:  You might notice some irritation and congestion in your nose or some drainage.  This is from the oxygen used during your procedure.  There is no need for concern and it should clear up in a day or so.  SYMPTOMS TO REPORT IMMEDIATELY:  Following lower endoscopy (colonoscopy or flexible sigmoidoscopy):  Excessive amounts of blood in the stool  Significant tenderness or worsening of abdominal pains  Swelling of the abdomen that is new, acute  Fever of 100F or higher   For urgent or emergent issues, a gastroenterologist can be reached at any hour by calling [phone]. Do not use MyChart messaging for urgent concerns.    DIET:  We do recommend a small meal at first, but then you may proceed to your regular diet.  Drink plenty of fluids but you should avoid alcoholic beverages for 24 hours.  ACTIVITY:   You should plan to take it easy for the rest of today and you should NOT DRIVE or use heavy machinery until tomorrow (because of the sedation medicines used during the test).    FOLLOW UP: Our staff will call the number listed on your records the next business day following your procedure.  We will call around 7:15- 8:00 am to check on you and address any questions or concerns that you may have regarding the information given to you following your procedure. If we do not reach you, we will leave a message.     If any biopsies were taken you will be contacted by phone or by letter within the next 1-3 weeks.  Please call us at [phone] if you have not heard about the biopsies in 3 weeks.    SIGNATURES/CONFIDENTIALITY: You and/or your care partner have signed paperwork which will be entered into your electronic medical record.  These signatures attest to the fact that that the information above on your After Visit Summary has been reviewed and is understood.  Full responsibility of the confidentiality of this discharge information lies with you and/or your care-partner.

## 2022-05-09 ENCOUNTER — Telehealth: Payer: Self-pay | Admitting: *Deleted

## 2022-05-09 NOTE — Telephone Encounter (Signed)
No answer on  follow up call. Left message.   

## 2022-05-15 ENCOUNTER — Encounter: Payer: Self-pay | Admitting: Internal Medicine

## 2022-06-07 ENCOUNTER — Encounter: Payer: Self-pay | Admitting: Radiology

## 2022-08-14 DIAGNOSIS — J45909 Unspecified asthma, uncomplicated: Secondary | ICD-10-CM | POA: Diagnosis not present

## 2022-08-14 DIAGNOSIS — I10 Essential (primary) hypertension: Secondary | ICD-10-CM | POA: Diagnosis not present

## 2022-08-14 DIAGNOSIS — R609 Edema, unspecified: Secondary | ICD-10-CM | POA: Diagnosis not present

## 2022-08-14 DIAGNOSIS — M25512 Pain in left shoulder: Secondary | ICD-10-CM | POA: Diagnosis not present

## 2022-08-14 DIAGNOSIS — L120 Bullous pemphigoid: Secondary | ICD-10-CM | POA: Diagnosis not present

## 2022-08-16 ENCOUNTER — Other Ambulatory Visit: Payer: Self-pay | Admitting: Internal Medicine

## 2022-08-16 DIAGNOSIS — Z1231 Encounter for screening mammogram for malignant neoplasm of breast: Secondary | ICD-10-CM

## 2022-09-10 DIAGNOSIS — L218 Other seborrheic dermatitis: Secondary | ICD-10-CM | POA: Diagnosis not present

## 2022-09-10 DIAGNOSIS — L120 Bullous pemphigoid: Secondary | ICD-10-CM | POA: Diagnosis not present

## 2022-09-12 ENCOUNTER — Ambulatory Visit
Admission: RE | Admit: 2022-09-12 | Discharge: 2022-09-12 | Disposition: A | Discharge status: Home / Self Care | Payer: Medicare HMO | Source: Ambulatory Visit | Attending: Internal Medicine | Admitting: Internal Medicine

## 2022-09-12 DIAGNOSIS — Z1231 Encounter for screening mammogram for malignant neoplasm of breast: Secondary | ICD-10-CM

## 2022-10-18 ENCOUNTER — Telehealth: Payer: Self-pay | Admitting: *Deleted

## 2022-10-18 NOTE — Telephone Encounter (Signed)
1 year call completed.

## 2023-03-05 DIAGNOSIS — R748 Abnormal levels of other serum enzymes: Secondary | ICD-10-CM | POA: Diagnosis not present

## 2023-03-05 DIAGNOSIS — Z1331 Encounter for screening for depression: Secondary | ICD-10-CM | POA: Diagnosis not present

## 2023-03-05 DIAGNOSIS — M169 Osteoarthritis of hip, unspecified: Secondary | ICD-10-CM | POA: Diagnosis not present

## 2023-03-05 DIAGNOSIS — E039 Hypothyroidism, unspecified: Secondary | ICD-10-CM | POA: Diagnosis not present

## 2023-03-05 DIAGNOSIS — Z0000 Encounter for general adult medical examination without abnormal findings: Secondary | ICD-10-CM | POA: Diagnosis not present

## 2023-03-05 DIAGNOSIS — I10 Essential (primary) hypertension: Secondary | ICD-10-CM | POA: Diagnosis not present

## 2023-03-05 DIAGNOSIS — K219 Gastro-esophageal reflux disease without esophagitis: Secondary | ICD-10-CM | POA: Diagnosis not present

## 2023-03-05 DIAGNOSIS — E7800 Pure hypercholesterolemia, unspecified: Secondary | ICD-10-CM | POA: Diagnosis not present

## 2023-03-05 DIAGNOSIS — R609 Edema, unspecified: Secondary | ICD-10-CM | POA: Diagnosis not present

## 2023-03-05 DIAGNOSIS — M170 Bilateral primary osteoarthritis of knee: Secondary | ICD-10-CM | POA: Diagnosis not present

## 2023-03-20 DIAGNOSIS — L308 Other specified dermatitis: Secondary | ICD-10-CM | POA: Diagnosis not present

## 2023-04-09 DIAGNOSIS — L120 Bullous pemphigoid: Secondary | ICD-10-CM | POA: Diagnosis not present

## 2023-04-09 DIAGNOSIS — I10 Essential (primary) hypertension: Secondary | ICD-10-CM | POA: Diagnosis not present

## 2023-04-09 DIAGNOSIS — M7989 Other specified soft tissue disorders: Secondary | ICD-10-CM | POA: Diagnosis not present

## 2023-05-01 DIAGNOSIS — R748 Abnormal levels of other serum enzymes: Secondary | ICD-10-CM | POA: Diagnosis not present

## 2023-05-28 IMAGING — MR MR LUMBAR SPINE W/O CM
5 of 6 series · 30 of 48 positions shown · non-contrast
Comparison: Radiography 01/11/2021

CLINICAL DATA: Lumbar radiculopathy, symptoms persistent after
greater than 6 weeks of treatment. Right-sided low back pain
radiating to the right leg.

EXAM:
MRI LUMBAR SPINE WITHOUT CONTRAST
TECHNIQUE: Multiplanar, multisequence MR imaging of the lumbar spine was
performed. No intravenous contrast was administered.

[Series 3: T2 · sagittal · 4.0mm · 1.09mm/px · 6 of 17 slices shown (1 of 3)]
[im 1/17]
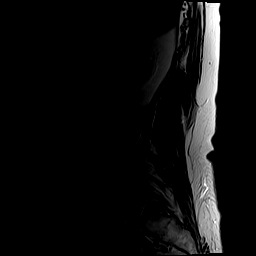
[im 4/17]
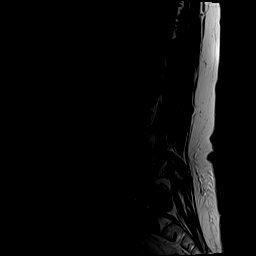
[im 7/17]
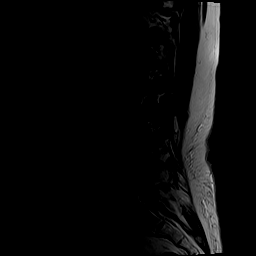
[im 10/17]
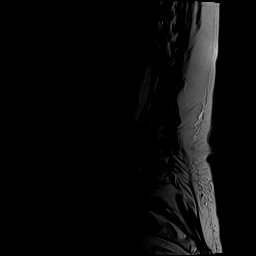
[im 13/17]
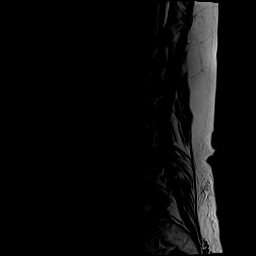
[im 17/17]
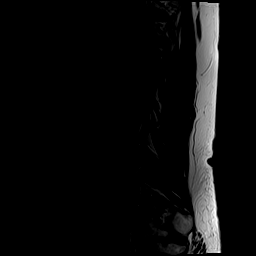

[Series 5: T1 · sagittal · 4.0mm · 1.09mm/px · 5 of 17 slices shown (1 of 2)]
[im 1/17]
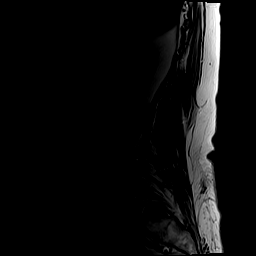
[im 5/17]
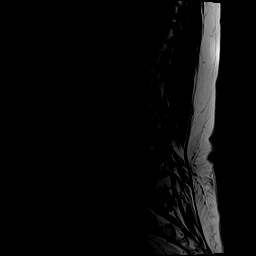
[im 9/17]
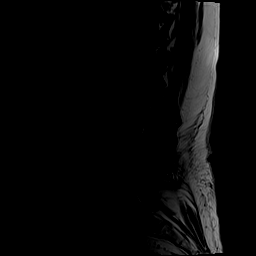
[im 13/17]
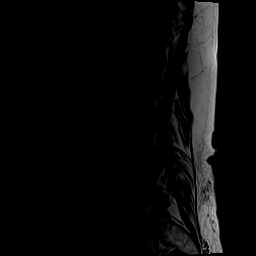
[im 17/17]
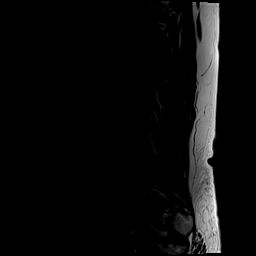

[Series 6: T2 · axial · 4.0mm · 0.39mm/px · z∈[-80,+124]mm · 9 of 40 slices shown (2 of 3)]
[im 1/40]
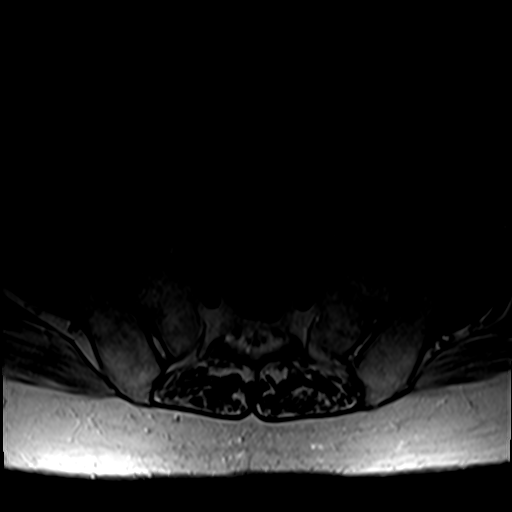
[im 7/40]
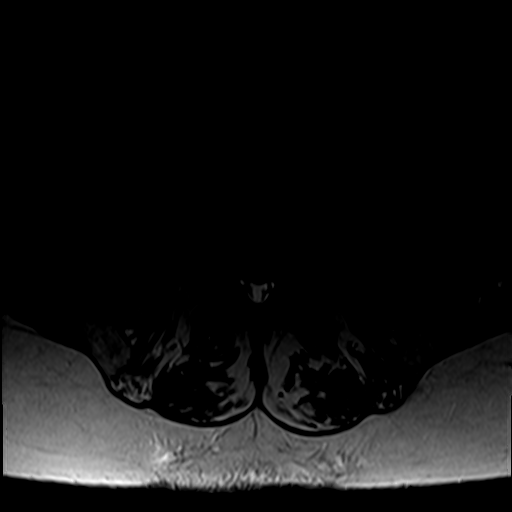
[im 14/40]
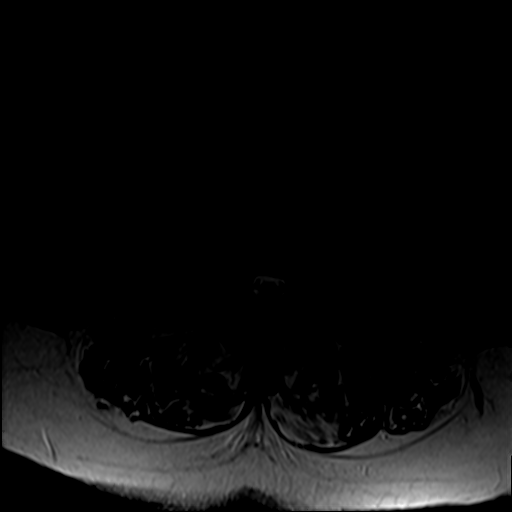
[im 17/40]
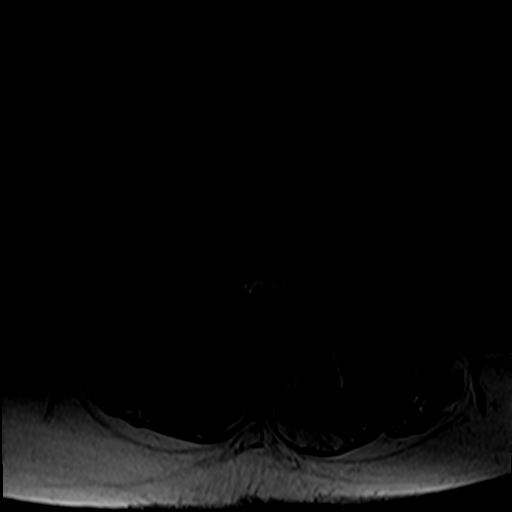
[im 20/40]
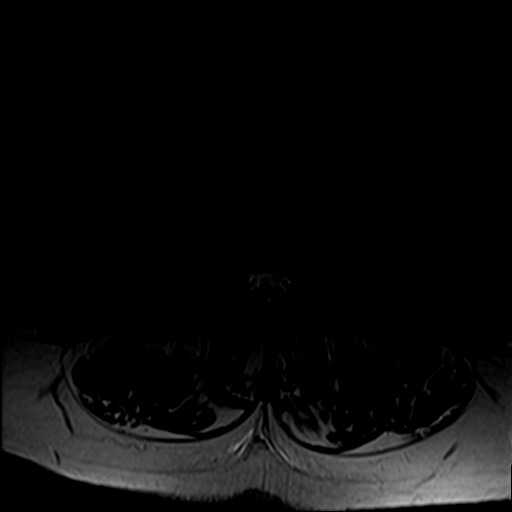
[im 23/40]
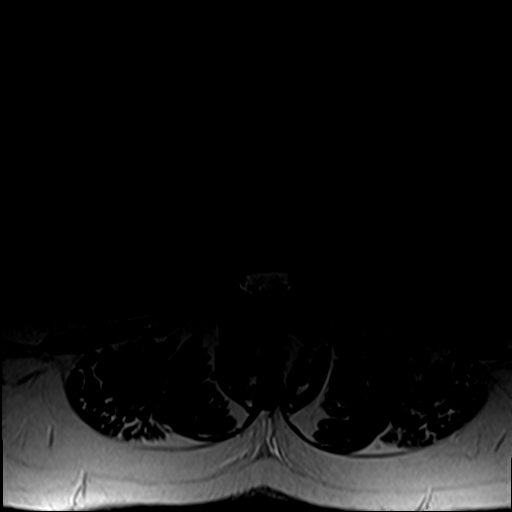
[im 27/40]
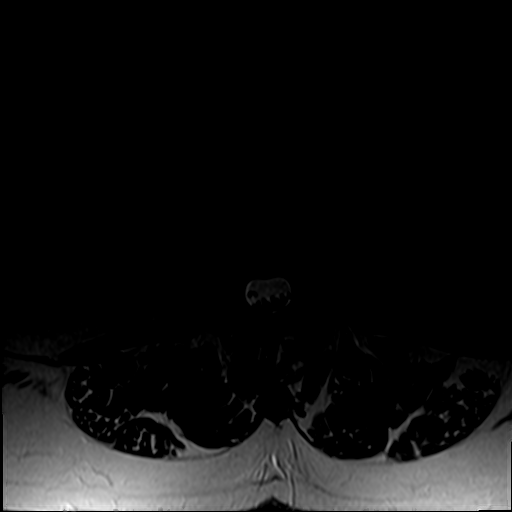
[im 33/40]
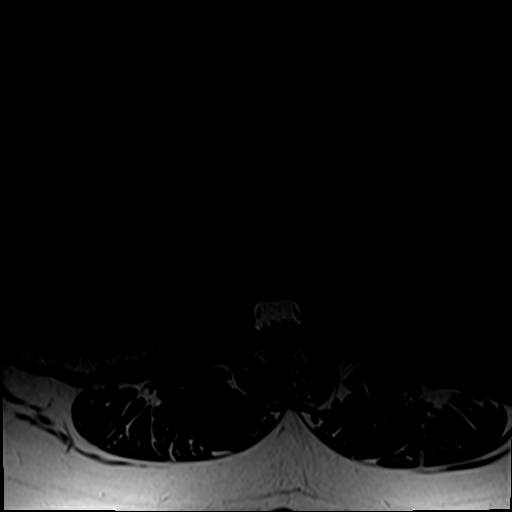
[im 40/40]
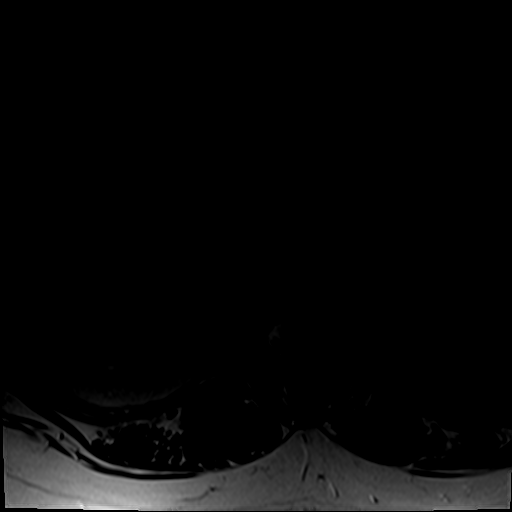

[Series 7: T1 · axial · 4.0mm · 0.39mm/px · z∈[-80,+14]mm · 4 of 40 slices shown (2 of 2)]
[im 1/40]
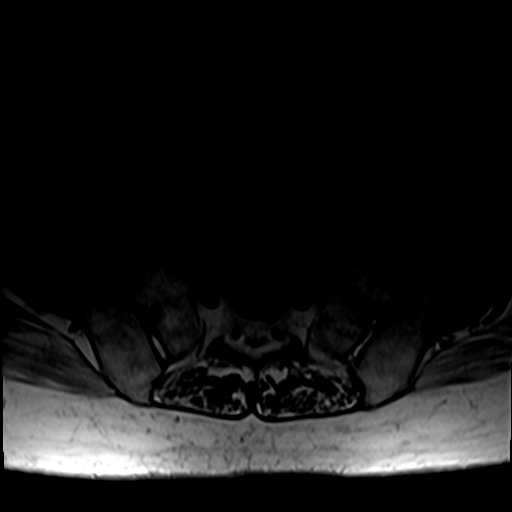
[im 7/40]
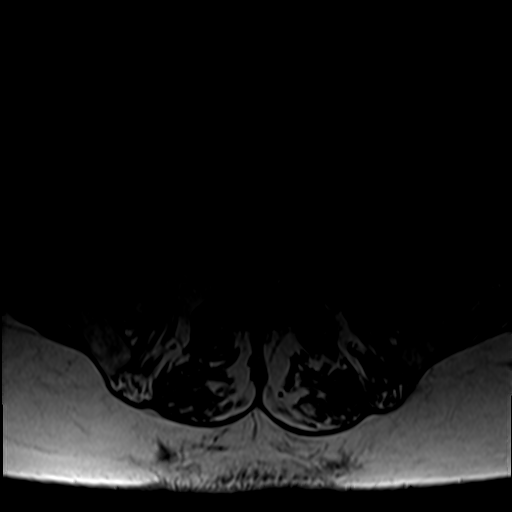
[im 14/40]
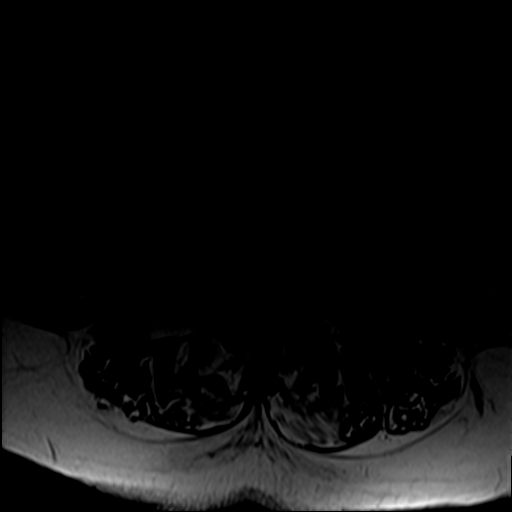
[im 17/40]
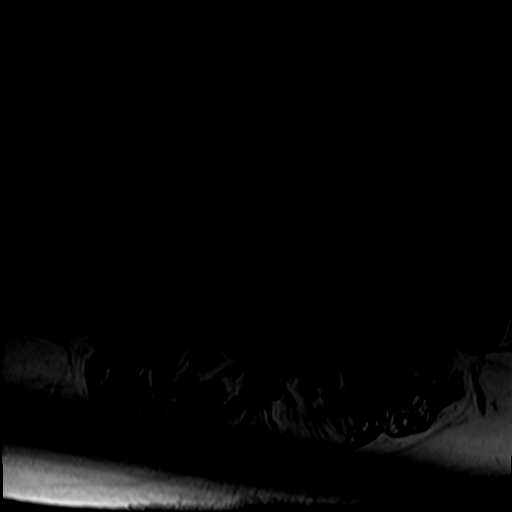

[Series 8: T2 · coronal · 4.0mm · 1.09mm/px · 6 of 18 slices shown (3 of 3)]
[im 1/18]
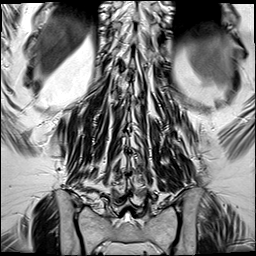
[im 4/18]
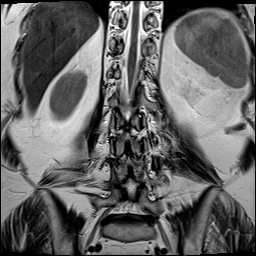
[im 7/18]
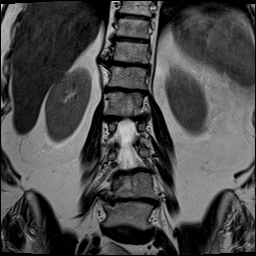
[im 11/18]
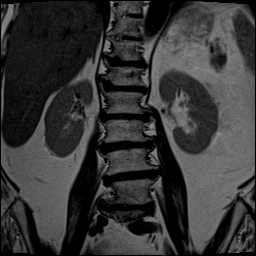
[im 14/18]
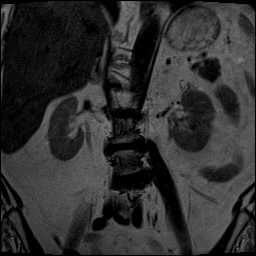
[im 18/18]
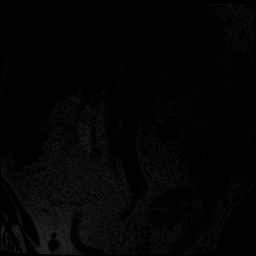

[30 of 48 positions shown; findings below may reference images not displayed]

FINDINGS: Segmentation:  5 lumbar type vertebral bodies.

Alignment: Mild scoliotic curvature convex to the right. 1 mm of
anterolisthesis L3-4 and 2 mm of anterolisthesis at L5-S1. 2 mm
retrolisthesis L1-2.

Vertebrae: No fracture or focal bone lesion. Mild discogenic
endplate marrow change at L4-5 with mild edema.

Conus medullaris and cauda equina: Conus extends to the T12-L1
level. Conus and cauda equina appear normal.

Paraspinal and other soft tissues: Negative

Disc levels:

Minimal non-compressive disc bulges at T12-L1 and above.

L1-2: Disc degeneration with 2 mm of retrolisthesis. Mild bulging of
the disc. No compressive stenosis.

L2-3: Mild bulging of the disc, somewhat prominent in the left
extraforaminal region. Mild facet and ligamentous hypertrophy. Mild
stenosis of both lateral recesses left more than right. No visible
neural compression.

L3-4: 1 mm of anterolisthesis. Bulging of the disc. Facet and
ligamentous hypertrophy. Moderate multifactorial stenosis at this
level that could cause neural compression. L3 nerves exit the
foramina without compression.

L4-5: Endplate osteophytes and shallow protrusion of the disc more
prominent towards the right. Facet and ligamentous hypertrophy.
Severe multifactorial spinal stenosis, worse on the right. Foraminal
stenosis on the right. Neural compression could occur at this level,
particularly on the right.

L5-S1: Bilateral facet arthropathy with 2 mm of anterolisthesis.
Mild bulging of the disc. Mild stenosis of the subarticular lateral
recesses. Asymmetric bulging of disc material into the left
foraminal region could affect the left L5 nerve in that location.
IMPRESSION: L4-5: Severe multifactorial spinal stenosis because of endplate
osteophytes, shallow protrusion of the disc more prominent towards
the right and facet and ligamentous hypertrophy. There is right
foraminal stenosis as well. Neural compression could occur on either
or both sides at this level, particularly the right.

L3-4: Facet arthropathy with 1 mm of anterolisthesis. Bulging of the
disc. Moderate stenosis at this level with some potential for neural
compression in the lateral recesses.

L5-S1: Facet arthropathy with 2 mm of anterolisthesis. Bulging of
the disc. Stenosis of the subarticular lateral recesses that could
possibly affect the S1 nerves. Left foraminal encroachment by
asymmetric disc material could affect the left L5 nerve.

## 2023-08-05 DIAGNOSIS — E039 Hypothyroidism, unspecified: Secondary | ICD-10-CM | POA: Diagnosis not present

## 2023-09-05 DIAGNOSIS — N289 Disorder of kidney and ureter, unspecified: Secondary | ICD-10-CM | POA: Diagnosis not present

## 2023-09-05 DIAGNOSIS — R609 Edema, unspecified: Secondary | ICD-10-CM | POA: Diagnosis not present

## 2023-09-05 DIAGNOSIS — M25551 Pain in right hip: Secondary | ICD-10-CM | POA: Diagnosis not present

## 2023-09-05 DIAGNOSIS — Z23 Encounter for immunization: Secondary | ICD-10-CM | POA: Diagnosis not present

## 2023-09-05 DIAGNOSIS — E7800 Pure hypercholesterolemia, unspecified: Secondary | ICD-10-CM | POA: Diagnosis not present

## 2023-09-05 DIAGNOSIS — E039 Hypothyroidism, unspecified: Secondary | ICD-10-CM | POA: Diagnosis not present

## 2023-09-05 DIAGNOSIS — I10 Essential (primary) hypertension: Secondary | ICD-10-CM | POA: Diagnosis not present

## 2023-10-17 ENCOUNTER — Other Ambulatory Visit: Payer: Self-pay | Admitting: Internal Medicine

## 2023-10-17 DIAGNOSIS — Z1231 Encounter for screening mammogram for malignant neoplasm of breast: Secondary | ICD-10-CM

## 2023-11-06 ENCOUNTER — Ambulatory Visit
Admission: RE | Admit: 2023-11-06 | Discharge: 2023-11-06 | Disposition: A | Discharge status: Home / Self Care | Source: Ambulatory Visit | Attending: Internal Medicine | Admitting: Internal Medicine

## 2023-11-06 DIAGNOSIS — Z1231 Encounter for screening mammogram for malignant neoplasm of breast: Secondary | ICD-10-CM | POA: Diagnosis not present
